# Patient Record
Sex: Male | Born: 1960
Health system: Southern US, Community
[De-identification: ages and names within clinical notes are randomized; demographics above are authoritative.]

## PROBLEM LIST (undated history)

## (undated) DIAGNOSIS — N189 Chronic kidney disease, unspecified: Secondary | ICD-10-CM

## (undated) DIAGNOSIS — T4145XA Adverse effect of unspecified anesthetic, initial encounter: Secondary | ICD-10-CM

## (undated) DIAGNOSIS — Z9889 Other specified postprocedural states: Secondary | ICD-10-CM

## (undated) DIAGNOSIS — R51 Headache: Secondary | ICD-10-CM

## (undated) DIAGNOSIS — G4700 Insomnia, unspecified: Secondary | ICD-10-CM

## (undated) DIAGNOSIS — M4846XA Fatigue fracture of vertebra, lumbar region, initial encounter for fracture: Secondary | ICD-10-CM

## (undated) DIAGNOSIS — R441 Visual hallucinations: Secondary | ICD-10-CM

## (undated) DIAGNOSIS — T8859XA Other complications of anesthesia, initial encounter: Secondary | ICD-10-CM

## (undated) DIAGNOSIS — I10 Essential (primary) hypertension: Secondary | ICD-10-CM

## (undated) DIAGNOSIS — R569 Unspecified convulsions: Secondary | ICD-10-CM

## (undated) DIAGNOSIS — R519 Headache, unspecified: Secondary | ICD-10-CM

## (undated) DIAGNOSIS — I729 Aneurysm of unspecified site: Secondary | ICD-10-CM

## (undated) DIAGNOSIS — R112 Nausea with vomiting, unspecified: Secondary | ICD-10-CM

## (undated) DIAGNOSIS — J302 Other seasonal allergic rhinitis: Secondary | ICD-10-CM

## (undated) HISTORY — PX: KNEE SURGERY: SHX244

---

## 2005-02-26 ENCOUNTER — Ambulatory Visit: Payer: Self-pay | Admitting: Unknown Physician Specialty

## 2006-03-23 DIAGNOSIS — I10 Essential (primary) hypertension: Secondary | ICD-10-CM | POA: Insufficient documentation

## 2008-10-22 ENCOUNTER — Emergency Department: Payer: Self-pay | Admitting: Emergency Medicine

## 2008-11-05 ENCOUNTER — Ambulatory Visit: Payer: Self-pay | Admitting: Specialist

## 2009-05-31 DIAGNOSIS — I729 Aneurysm of unspecified site: Secondary | ICD-10-CM

## 2009-05-31 HISTORY — DX: Aneurysm of unspecified site: I72.9

## 2009-05-31 HISTORY — PX: BRAIN SURGERY: SHX531

## 2010-02-07 ENCOUNTER — Emergency Department: Payer: Self-pay | Admitting: Emergency Medicine

## 2010-02-10 ENCOUNTER — Emergency Department: Payer: Self-pay | Admitting: Emergency Medicine

## 2010-03-12 DIAGNOSIS — I499 Cardiac arrhythmia, unspecified: Secondary | ICD-10-CM | POA: Insufficient documentation

## 2010-08-04 ENCOUNTER — Ambulatory Visit: Payer: Self-pay | Admitting: Family Medicine

## 2011-10-21 DIAGNOSIS — G40909 Epilepsy, unspecified, not intractable, without status epilepticus: Secondary | ICD-10-CM | POA: Insufficient documentation

## 2012-03-02 ENCOUNTER — Ambulatory Visit: Payer: Self-pay | Admitting: Orthopedic Surgery

## 2012-06-01 DIAGNOSIS — G4700 Insomnia, unspecified: Secondary | ICD-10-CM | POA: Insufficient documentation

## 2012-06-21 DIAGNOSIS — H538 Other visual disturbances: Secondary | ICD-10-CM | POA: Insufficient documentation

## 2012-06-21 DIAGNOSIS — I671 Cerebral aneurysm, nonruptured: Secondary | ICD-10-CM | POA: Insufficient documentation

## 2013-01-29 DIAGNOSIS — R413 Other amnesia: Secondary | ICD-10-CM | POA: Insufficient documentation

## 2013-06-07 DIAGNOSIS — R413 Other amnesia: Secondary | ICD-10-CM | POA: Insufficient documentation

## 2013-08-16 DIAGNOSIS — H5316 Psychophysical visual disturbances: Secondary | ICD-10-CM | POA: Insufficient documentation

## 2013-08-18 DIAGNOSIS — S37009A Unspecified injury of unspecified kidney, initial encounter: Secondary | ICD-10-CM | POA: Insufficient documentation

## 2014-11-22 ENCOUNTER — Telehealth: Payer: Self-pay | Admitting: Family Medicine

## 2014-11-22 DIAGNOSIS — R109 Unspecified abdominal pain: Secondary | ICD-10-CM | POA: Insufficient documentation

## 2014-11-22 DIAGNOSIS — R1084 Generalized abdominal pain: Secondary | ICD-10-CM

## 2014-11-22 NOTE — Telephone Encounter (Signed)
Pt is requesting referral to see a GI doctor for abdominal pain,diarrhea.He was seen for this in our office in Feb 2016 and states he is still having same issues.He has seen Dr Candace Cruise in the past

## 2014-11-22 NOTE — Telephone Encounter (Signed)
OK. Please refer to Dr. Candace Cruise.

## 2014-12-05 DIAGNOSIS — G8929 Other chronic pain: Secondary | ICD-10-CM | POA: Insufficient documentation

## 2014-12-05 DIAGNOSIS — R443 Hallucinations, unspecified: Secondary | ICD-10-CM | POA: Insufficient documentation

## 2014-12-05 DIAGNOSIS — K5790 Diverticulosis of intestine, part unspecified, without perforation or abscess without bleeding: Secondary | ICD-10-CM | POA: Insufficient documentation

## 2014-12-05 DIAGNOSIS — S32009A Unspecified fracture of unspecified lumbar vertebra, initial encounter for closed fracture: Secondary | ICD-10-CM | POA: Insufficient documentation

## 2014-12-05 DIAGNOSIS — K5730 Diverticulosis of large intestine without perforation or abscess without bleeding: Secondary | ICD-10-CM | POA: Insufficient documentation

## 2014-12-05 DIAGNOSIS — G4709 Other insomnia: Secondary | ICD-10-CM | POA: Insufficient documentation

## 2014-12-05 DIAGNOSIS — S32000A Wedge compression fracture of unspecified lumbar vertebra, initial encounter for closed fracture: Secondary | ICD-10-CM | POA: Insufficient documentation

## 2014-12-05 DIAGNOSIS — R51 Headache: Secondary | ICD-10-CM

## 2014-12-06 ENCOUNTER — Ambulatory Visit: Payer: Self-pay | Admitting: Family Medicine

## 2014-12-30 ENCOUNTER — Other Ambulatory Visit: Payer: Self-pay | Admitting: Nurse Practitioner

## 2014-12-30 DIAGNOSIS — K5730 Diverticulosis of large intestine without perforation or abscess without bleeding: Secondary | ICD-10-CM

## 2014-12-30 DIAGNOSIS — R1032 Left lower quadrant pain: Secondary | ICD-10-CM

## 2015-01-01 ENCOUNTER — Ambulatory Visit
Admission: RE | Admit: 2015-01-01 | Discharge: 2015-01-01 | Disposition: A | Discharge status: Home / Self Care | Payer: 59 | Source: Ambulatory Visit | Attending: Nurse Practitioner | Admitting: Nurse Practitioner

## 2015-01-01 DIAGNOSIS — N281 Cyst of kidney, acquired: Secondary | ICD-10-CM | POA: Diagnosis not present

## 2015-01-01 DIAGNOSIS — K5730 Diverticulosis of large intestine without perforation or abscess without bleeding: Secondary | ICD-10-CM | POA: Insufficient documentation

## 2015-01-01 DIAGNOSIS — N200 Calculus of kidney: Secondary | ICD-10-CM | POA: Insufficient documentation

## 2015-01-01 DIAGNOSIS — R1032 Left lower quadrant pain: Secondary | ICD-10-CM

## 2015-01-01 HISTORY — DX: Essential (primary) hypertension: I10

## 2015-01-01 MED ORDER — IOHEXOL 300 MG/ML  SOLN
100.0000 mL | Freq: Once | INTRAMUSCULAR | Status: AC | PRN
  Administered 2015-01-01: 100 mL via INTRAVENOUS

## 2015-01-06 ENCOUNTER — Ambulatory Visit: Admission: RE | Admit: 2015-01-06 | Payer: Self-pay | Source: Ambulatory Visit

## 2015-01-15 ENCOUNTER — Encounter: Payer: Self-pay | Admitting: *Deleted

## 2015-01-16 ENCOUNTER — Encounter: Payer: Self-pay | Admitting: Anesthesiology

## 2015-01-16 ENCOUNTER — Ambulatory Visit: Payer: 59 | Admitting: Anesthesiology

## 2015-01-16 ENCOUNTER — Encounter
Admission: RE | Disposition: A | Discharge status: Home Health | Payer: Self-pay | Source: Ambulatory Visit | Attending: Gastroenterology

## 2015-01-16 ENCOUNTER — Ambulatory Visit
Admission: RE | Admit: 2015-01-16 | Discharge: 2015-01-16 | Disposition: A | Discharge status: Home Health | Payer: 59 | Source: Ambulatory Visit | Attending: Gastroenterology | Admitting: Gastroenterology

## 2015-01-16 DIAGNOSIS — D120 Benign neoplasm of cecum: Secondary | ICD-10-CM | POA: Diagnosis not present

## 2015-01-16 DIAGNOSIS — Z860101 Personal history of adenomatous and serrated colon polyps: Secondary | ICD-10-CM

## 2015-01-16 DIAGNOSIS — R1032 Left lower quadrant pain: Secondary | ICD-10-CM | POA: Diagnosis present

## 2015-01-16 DIAGNOSIS — Z79899 Other long term (current) drug therapy: Secondary | ICD-10-CM | POA: Insufficient documentation

## 2015-01-16 DIAGNOSIS — I10 Essential (primary) hypertension: Secondary | ICD-10-CM | POA: Diagnosis not present

## 2015-01-16 DIAGNOSIS — Z888 Allergy status to other drugs, medicaments and biological substances status: Secondary | ICD-10-CM | POA: Insufficient documentation

## 2015-01-16 DIAGNOSIS — Z86010 Personal history of colonic polyps: Secondary | ICD-10-CM

## 2015-01-16 HISTORY — DX: Visual hallucinations: R44.1

## 2015-01-16 HISTORY — DX: Aneurysm of unspecified site: I72.9

## 2015-01-16 HISTORY — PX: COLONOSCOPY WITH PROPOFOL: SHX5780

## 2015-01-16 HISTORY — DX: Insomnia, unspecified: G47.00

## 2015-01-16 HISTORY — DX: Unspecified convulsions: R56.9

## 2015-01-16 HISTORY — DX: Other seasonal allergic rhinitis: J30.2

## 2015-01-16 SURGERY — COLONOSCOPY WITH PROPOFOL
Anesthesia: General

## 2015-01-16 MED ORDER — MIDAZOLAM HCL 2 MG/2ML IJ SOLN
INTRAMUSCULAR | Status: DC | PRN
  Administered 2015-01-16: 1 mg via INTRAVENOUS

## 2015-01-16 MED ORDER — SODIUM CHLORIDE 0.9 % IV SOLN: INTRAVENOUS | Status: DC

## 2015-01-16 MED ORDER — PROPOFOL INFUSION 10 MG/ML OPTIME
INTRAVENOUS | Status: DC | PRN
  Administered 2015-01-16: 120 ug/kg/min via INTRAVENOUS

## 2015-01-16 MED ORDER — FENTANYL CITRATE (PF) 100 MCG/2ML IJ SOLN
INTRAMUSCULAR | Status: DC | PRN
  Administered 2015-01-16: 50 ug via INTRAVENOUS

## 2015-01-16 MED ORDER — SODIUM CHLORIDE 0.9 % IV SOLN
INTRAVENOUS | Status: DC
  Administered 2015-01-16: 1000 mL via INTRAVENOUS

## 2015-01-16 NOTE — Op Note (Signed)
[facility] Gastroenterology Patient Name: Dustin Paul Procedure Date: 01/16/2015 1:43 PM MRN: [id number] Account #: [id number] Date of Birth: 1961/03/25 Admit Type: Outpatient Age: 54 Room: [facility] ENDO ROOM 4 Gender: Male Note Status: Finalized Procedure:         Colonoscopy Indications:       Abdominal pain in the left lower quadrant, Change in bowel                     habits Providers:         Lupita Dawn. Candace Cruise, MD Referring MD:      Kirstie Peri. Caryn Section, MD (Referring MD) Medicines:         Monitored Anesthesia Care Complications:     No immediate complications. Procedure:         Pre-Anesthesia Assessment:                    - Prior to the procedure, a History and Physical was                     performed, and patient medications, allergies and                     sensitivities were reviewed. The patient's tolerance of                     previous anesthesia was reviewed.                    - The risks and benefits of the procedure and the sedation                     options and risks were discussed with the patient. All                     questions were answered and informed consent was obtained.                    - After reviewing the risks and benefits, the patient was                     deemed in satisfactory condition to undergo the procedure.                    After obtaining informed consent, the colonoscope was                     passed under direct vision. Throughout the procedure, the                     patient's blood pressure, pulse, and oxygen saturations                     were monitored continuously. The Colonoscope was                     introduced through the anus and advanced to the the cecum,                     identified by appendiceal orifice and ileocecal valve. The                     colonoscopy was performed without difficulty. The patient  tolerated the procedure well. The quality of the bowel      preparation was good. Findings:      A small polyp was found in the cecum. The polyp was sessile. The polyp       was removed with a cold snare. Resection and retrieval were complete.      The exam was otherwise without abnormality.      No diverticuli in sigmoid area. Impression:        - One small polyp in the cecum. Resected and retrieved.                    - The examination was otherwise normal. Recommendation:    - Discharge patient to home.                    - High fiber diet daily.                    - The findings and recommendations were discussed with the                     patient.                    - Await pathology results.                    - Repeat colonoscopy in 5 years for surveillance. Procedure Code(s): --- Professional ---                    [phone], Colonoscopy, flexible; with removal of tumor(s),                     polyp(s), or other lesion(s) by snare technique Diagnosis Code(s): --- Professional ---                    D12.0, Benign neoplasm of cecum                    R10.32, Left lower quadrant pain                    R19.4, Change in bowel habit CPT copyright 2014 American Medical Association. All rights reserved. The codes documented in this report are preliminary and upon coder review may  be revised to meet current compliance requirements. Hulen Luster, MD 01/16/2015 2:02:55 PM This report has been signed electronically. Number of Addenda: 0 Note Initiated On: 01/16/2015 1:43 PM Scope Withdrawal Time: 0 hours 8 minutes 38 seconds  Total Procedure Duration: 0 hours 13 minutes 20 seconds       [facility]

## 2015-01-16 NOTE — Anesthesia Procedure Notes (Signed)
Performed by: COOK-MARTIN, Amarea Macdowell Pre-anesthesia Checklist: Patient identified, Emergency Drugs available, Suction available, Patient being monitored and Timeout performed Patient Re-evaluated:Patient Re-evaluated prior to inductionOxygen Delivery Method: Nasal cannula Preoxygenation: Pre-oxygenation with 100% oxygen Intubation Type: IV induction Placement Confirmation: positive ETCO2 and CO2 detector       

## 2015-01-16 NOTE — Transfer of Care (Signed)
Immediate Anesthesia Transfer of Care Note  Patient: Dustin Paul  Procedure(s) Performed: Procedure(s): COLONOSCOPY WITH PROPOFOL (N/A)  Patient Location: PACU  Anesthesia Type:General  Level of Consciousness: awake, alert , oriented and sedated  Airway & Oxygen Therapy: Patient Spontanous Breathing and Patient connected to nasal cannula oxygen  Post-op Assessment: Report given to RN and Post -op Vital signs reviewed and stable  Post vital signs: Reviewed and stable  Last Vitals: There were no vitals filed for this visit.  Complications: No apparent anesthesia complications

## 2015-01-16 NOTE — H&P (Signed)
  Date of Initial H&P: 12/30/2014  History reviewed, patient examined, no change in status, stable for surgery.

## 2015-01-16 NOTE — Anesthesia Preprocedure Evaluation (Signed)
Anesthesia Evaluation  Patient identified by MRN, date of birth, ID band Patient awake    Reviewed: Allergy & Precautions, H&P , NPO status , Patient's Chart, lab work & pertinent test results, reviewed documented beta blocker date and time   History of Anesthesia Complications (+) PROLONGED EMERGENCE, Emergence Delirium and history of anesthetic complications  Airway Mallampati: II  TM Distance: >3 FB Neck ROM: full    Dental no notable dental hx.    Pulmonary neg pulmonary ROS,  breath sounds clear to auscultation  Pulmonary exam normal       Cardiovascular Exercise Tolerance: Good hypertension, negative cardio ROS  + dysrhythmias Rhythm:regular Rate:Normal     Neuro/Psych negative neurological ROS  negative psych ROS   GI/Hepatic negative GI ROS, Neg liver ROS,   Endo/Other  negative endocrine ROS  Renal/GU Renal diseasenegative Renal ROS  negative genitourinary   Musculoskeletal   Abdominal   Peds  Hematology negative hematology ROS (+)   Anesthesia Other Findings   Reproductive/Obstetrics negative OB ROS                             Anesthesia Physical Anesthesia Plan  ASA: III  Anesthesia Plan: General   Post-op Pain Management:    Induction:   Airway Management Planned:   Additional Equipment:   Intra-op Plan:   Post-operative Plan:   Informed Consent: I have reviewed the patients History and Physical, chart, labs and discussed the procedure including the risks, benefits and alternatives for the proposed anesthesia with the patient or authorized representative who has indicated his/her understanding and acceptance.   Dental Advisory Given  Plan Discussed with: CRNA  Anesthesia Plan Comments:         Anesthesia Quick Evaluation

## 2015-01-17 ENCOUNTER — Encounter: Payer: Self-pay | Admitting: Gastroenterology

## 2015-01-20 LAB — SURGICAL PATHOLOGY

## 2015-01-20 NOTE — Anesthesia Postprocedure Evaluation (Signed)
  Anesthesia Post-op Note  Patient: Dustin Paul  Procedure(s) Performed: Procedure(s): COLONOSCOPY WITH PROPOFOL (N/A)  Anesthesia type:General  Patient location: PACU  Post pain: Pain level controlled  Post assessment: Post-op Vital signs reviewed, Patient's Cardiovascular Status Stable, Respiratory Function Stable, Patent Airway and No signs of Nausea or vomiting  Post vital signs: Reviewed and stable  Last Vitals:  Filed Vitals:   01/16/15 1440  BP: 112/89  Pulse: 72  Temp:   Resp: 23    Level of consciousness: awake, alert  and patient cooperative  Complications: No apparent anesthesia complications

## 2015-01-21 DIAGNOSIS — Z86010 Personal history of colonic polyps: Secondary | ICD-10-CM

## 2015-01-21 DIAGNOSIS — Z860101 Personal history of adenomatous and serrated colon polyps: Secondary | ICD-10-CM

## 2015-05-13 ENCOUNTER — Other Ambulatory Visit: Payer: Self-pay | Admitting: Family Medicine

## 2015-05-14 ENCOUNTER — Ambulatory Visit: Payer: Self-pay | Admitting: Licensed Clinical Social Worker

## 2015-05-15 ENCOUNTER — Encounter: Payer: Self-pay | Admitting: Psychiatry

## 2015-05-15 ENCOUNTER — Ambulatory Visit (INDEPENDENT_AMBULATORY_CARE_PROVIDER_SITE_OTHER): Payer: 59 | Admitting: Psychiatry

## 2015-05-15 ENCOUNTER — Ambulatory Visit: Payer: Self-pay | Admitting: Licensed Clinical Social Worker

## 2015-05-15 VITALS — BP 104/78 | HR 72 | Temp 97.9°F | Resp 16 | Ht 71.5 in | Wt 208.0 lb

## 2015-05-15 DIAGNOSIS — F29 Unspecified psychosis not due to a substance or known physiological condition: Secondary | ICD-10-CM

## 2015-05-15 DIAGNOSIS — F5105 Insomnia due to other mental disorder: Secondary | ICD-10-CM

## 2015-05-15 DIAGNOSIS — F489 Nonpsychotic mental disorder, unspecified: Secondary | ICD-10-CM | POA: Diagnosis not present

## 2015-05-15 MED ORDER — ARIPIPRAZOLE 5 MG PO TABS: 5.0000 mg | ORAL_TABLET | Freq: Every day | ORAL | Status: DC

## 2015-05-15 MED ORDER — TRAZODONE HCL 50 MG PO TABS: 100.0000 mg | ORAL_TABLET | Freq: Every day | ORAL | Status: DC

## 2015-05-15 MED ORDER — MIRTAZAPINE 30 MG PO TABS: 30.0000 mg | ORAL_TABLET | Freq: Every day | ORAL | Status: DC
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
## 2015-05-15 NOTE — Patient Instructions (Signed)
HAVE FASTING LABS DONE IN NEXT 1 To 2 weeks.

## 2015-05-21 ENCOUNTER — Ambulatory Visit: Payer: Self-pay | Admitting: Psychiatry

## 2015-06-15 ENCOUNTER — Other Ambulatory Visit: Payer: Self-pay | Admitting: Family Medicine

## 2015-06-16 ENCOUNTER — Encounter: Payer: Self-pay | Admitting: Psychiatry

## 2015-06-16 ENCOUNTER — Ambulatory Visit (INDEPENDENT_AMBULATORY_CARE_PROVIDER_SITE_OTHER): Payer: BLUE CROSS/BLUE SHIELD | Admitting: Psychiatry

## 2015-06-16 VITALS — BP 122/78 | HR 84 | Temp 97.8°F | Ht 71.5 in | Wt 208.6 lb

## 2015-06-16 DIAGNOSIS — F489 Nonpsychotic mental disorder, unspecified: Secondary | ICD-10-CM | POA: Diagnosis not present

## 2015-06-16 DIAGNOSIS — F29 Unspecified psychosis not due to a substance or known physiological condition: Secondary | ICD-10-CM | POA: Diagnosis not present

## 2015-06-16 DIAGNOSIS — F5105 Insomnia due to other mental disorder: Secondary | ICD-10-CM | POA: Diagnosis not present

## 2015-06-16 MED ORDER — TRAZODONE HCL 50 MG PO TABS: ORAL_TABLET | ORAL | Status: DC

## 2015-06-16 MED ORDER — MIRTAZAPINE 30 MG PO TABS: 30.0000 mg | ORAL_TABLET | Freq: Every day | ORAL | Status: DC

## 2015-06-16 MED ORDER — ARIPIPRAZOLE 5 MG PO TABS: 5.0000 mg | ORAL_TABLET | Freq: Every day | ORAL | Status: DC

## 2015-06-16 NOTE — Progress Notes (Signed)
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 He was seen for the first time last month after transferring his care to this clinic secondary to his previous psychiatrist closing his practice. He indicates he's largely been stable on his current regimen of Abilify, mirtazapine and trazodone since March or April 2015. He denies any changes. He states overall his symptoms have been under reasonable control on these medications.  State that occasionally he'll have issues with his sight in one eye going away. He does worry about things such as his daughters and his seizures and intermittent problems with vision. He states his hallucinations have generally been under good control and that these started after he had surgery for his aneurysms. Chief Complaint: Okay Chief Complaint    Follow-up; Medication Refill     Visit Diagnosis:     ICD-9-CM ICD-10-CM   1. Insomnia due to mental condition 300.9 F48.9    327.02 F51.05   2. Psychosis, unspecified psychosis type 298.9 F29     Past Medical History:  Past Medical History  Diagnosis Date  . Hypertension   . Aneurysm of artery ([place])     middle cerebral  . Seizures ([place])   . Seasonal allergies   . Insomnia   . Visual hallucinations     Past Surgical History  Procedure Laterality Date  . Back surgery    . Brain surgery    . Knee surgery Left   . Colonoscopy with propofol N/A 01/16/2015    Procedure: COLONOSCOPY WITH PROPOFOL;  Surgeon: Hulen Luster, MD;  Location: [facility] ENDOSCOPY;  Service: Gastroenterology;  Laterality: N/A;   Family History: History reviewed. No pertinent family history. Social History:  Social History   Social History  . Marital Status: Widowed    Spouse Name: N/A  . Number of Children: N/A  . Years of Education: N/A   Social History Main Topics  . Smoking status: Never Smoker   .  Smokeless tobacco: Never Used  . Alcohol Use: No  . Drug Use: No  . Sexual Activity: Not Currently   Other Topics Concern  . None   Social History Narrative   Additional History:   Assessment:   Musculoskeletal: Strength & Muscle Tone: within normal limits Gait & Station: normal Patient leans: N/A  Psychiatric Specialty Exam: HPI  ROS  Blood pressure 122/78, pulse 84, temperature 97.8 F (36.6 C), temperature source Tympanic, height 5' 11.5" (1.816 m), weight 208 lb 9.6 oz (94.62 kg), SpO2 94 %.Body mass index is 28.69 kg/(m^2).  General Appearance: Well Groomed  Eye Contact:  Good  Speech:  Normal Rate  Volume:  Normal  Mood:  Okay  Affect:  Constricted  Thought Process:  Linear and Logical  Orientation:  Full (Time, Place, and Person)  Thought Content:  Negative  Suicidal Thoughts:  No  Homicidal Thoughts:  No  Memory:  Immediate;   Good Recent;   Good Remote;   Good  Judgement:  Good  Insight:  Good  Psychomotor Activity:  Negative  Concentration:  Good  Recall:  Good  Fund of Knowledge: Good  Language: Good  Akathisia:  Negative  Handed:    AIMS (if indicated):  Done 05/15/2015 and normal   Assets:  Communication Skills Desire for Improvement Social Support  ADL's:  Intact  Cognition: Impaired,  Moderate  Sleep:  Poor, he relates over past  week he may wake up between the hours of 3 and 7    Is the patient at risk to self?  No. Has the patient been a risk to self in the past 6 months?  No. Has the patient been a risk to self within the distant past?  No. Is the patient a risk to others?  No. Has the patient been a risk to others in the past 6 months?  No. Has the patient been a risk to others within the distant past?  No.  Current Medications: Current Outpatient Prescriptions  Medication Sig Dispense Refill  . Alpha-Lipoic Acid 300 MG CAPS Take 300 mg by mouth every morning. Reported on 05/15/2015    . ARIPiprazole (ABILIFY) 5 MG tablet Take 1  tablet (5 mg total) by mouth daily. 30 tablet 4  . Armodafinil (NUVIGIL) 150 MG tablet Take 150 mg by mouth daily.    Marland Kitchen BACILLUS COAGULANS-INULIN PO Take by mouth every morning. Reported on 05/15/2015    . celecoxib (CELEBREX) 200 MG capsule Take 1 capsule by mouth daily.    . Chlorophyll-Alfalfa 20-100 MG TABS Take 340 mg by mouth Nightly. Reported on 05/15/2015    . cyclobenzaprine (FLEXERIL) 10 MG tablet Take 10 mg by mouth. Reported on 05/15/2015    . diclofenac (CATAFLAM) 50 MG tablet Take 50 mg by mouth 2 (two) times daily. Reported on 05/15/2015    . fexofenadine (ALLEGRA) 180 MG tablet Take 1 tablet by mouth daily.    Marland Kitchen gabapentin (NEURONTIN) 300 MG capsule Take 300 mg by mouth at bedtime. Reported on 05/15/2015    . hyoscyamine (LEVSIN, ANASPAZ) 0.125 MG tablet Take 1 tablet by mouth every 6 (six) hours as needed.    . lamoTRIgine (LAMICTAL) 150 MG tablet Take 1 tablet by mouth 2 (two) times daily.    Marland Kitchen lisinopril (PRINIVIL,ZESTRIL) 5 MG tablet TAKE 1 TABLET BY MOUTH EVERY DAY 30 tablet 5  . Melatonin 10 MG CAPS Take 1 capsule by mouth at bedtime.    . mirtazapine (REMERON) 30 MG tablet Take 1 tablet (30 mg total) by mouth at bedtime. 30 tablet 4  . SUMAtriptan (IMITREX) 100 MG tablet Take 100 mg by mouth every 2 (two) hours as needed for migraine. May repeat in 2 hours if headache persists or recurs.    . Suvorexant 10 MG TABS Take 10 mg by mouth Nightly. Reported on 05/15/2015    . Topiramate ER (TROKENDI XR) 100 MG CP24 Take 1 capsule by mouth daily.    . Topiramate ER (TROKENDI XR) 50 MG CP24 Take 1 capsule by mouth daily.    Marland Kitchen zonisamide (ZONEGRAN) 50 MG capsule Take 1 capsule by mouth as needed. For headache    . traZODone (DESYREL) 50 MG tablet Take two to three tablets at bedtime for sleep. 90 tablet 4   No current facility-administered medications for this visit.    Medical Decision Making:  Established Problem, Stable/Improving (1), Review of Medication Regimen & Side  Effects (2) and Review of New Medication or Change in Dosage (2)  Treatment Plan Summary:Medication management and Plan plan  Psychotic disorder due to another medical condition-as discussed above the patient developed the hallucinations after having surgery for brain aneurysms. These have now been stable on Abilify 5 mg. he indicates he had misplaced his laboratory slip to get metabolic labs but then he recently found. He indicates he will [facility] [place] and get these done.  Insomnia secondary to  medical condition-the patient has found a good response to trazodone and mirtazapine. We'll continue  mirtazapine 30 mg at bedtime. Reports over the past week sleeping has been difficult thus I have instructed him that he can take trazodone 100 or 150 mg at bedtime.  I made him aware of my departure from the practice in February. I've encouraged him to call any questions or concerns prior to his next appointment. He'll follow-up with her next writer in 3 months however he can call any questions or concerns prior to that appointment. As indicates that his disability company periodically wants updates from his providers. I'm going to send in an update letter for patient given that it may be some time before he sees the new provider in this clinic. He presents today with examples of his previous letters from his previous psychiatrist. Faith Rogue 06/16/2015, 2:56 PM

## 2015-06-16 NOTE — Patient Instructions (Signed)
Have labs done?

## 2015-06-25 ENCOUNTER — Other Ambulatory Visit: Payer: Self-pay | Admitting: Psychiatry

## 2015-06-25 LAB — BASIC METABOLIC PANEL: GLUCOSE: 94 mg/dL

## 2015-06-25 LAB — TSH: TSH: 2.43 u[IU]/mL (ref 0.41–5.90)

## 2015-06-25 LAB — LIPID PANEL
Cholesterol: 135 mg/dL (ref 0–200)
HDL: 33 mg/dL — AB (ref 35–70)
LDL Cholesterol: 75 mg/dL
TRIGLYCERIDES: 135 mg/dL (ref 40–160)

## 2015-06-26 LAB — TSH: TSH: 2.43 u[IU]/mL (ref 0.450–4.500)

## 2015-06-26 LAB — PROLACTIN: PROLACTIN: 18.9 ng/mL — AB (ref 4.0–15.2)

## 2015-06-26 LAB — LIPID PANEL W/O CHOL/HDL RATIO
Cholesterol, Total: 135 mg/dL (ref 100–199)
HDL: 33 mg/dL — ABNORMAL LOW (ref >39)
LDL CALC: 75 mg/dL (ref 0–99)
Triglycerides: 135 mg/dL (ref 0–149)
VLDL CHOLESTEROL CAL: 27 mg/dL (ref 5–40)

## 2015-06-26 LAB — GLUCOSE, RANDOM: GLUCOSE: 94 mg/dL (ref 65–99)

## 2015-06-30 ENCOUNTER — Telehealth: Payer: Self-pay | Admitting: Psychiatry

## 2015-06-30 NOTE — Telephone Encounter (Signed)
Writer called patient on 11/25/2015 to discuss his lab results. His metabolic labs were normal with the exception of elevated prolactin at 18.9. In addition his HDL was slightly low at 33. I discussed with patient that if this point we would probably recheck this. However I'm going to make a note his chart so that the next provider can follow-up with him in regards to checking this result again. AW

## 2015-07-03 ENCOUNTER — Encounter: Payer: Self-pay | Admitting: Physician Assistant

## 2015-07-03 ENCOUNTER — Ambulatory Visit (INDEPENDENT_AMBULATORY_CARE_PROVIDER_SITE_OTHER): Payer: BLUE CROSS/BLUE SHIELD | Admitting: Physician Assistant

## 2015-07-03 VITALS — BP 128/70 | HR 94 | Temp 100.6°F | Resp 16 | Wt 215.0 lb

## 2015-07-03 DIAGNOSIS — R6889 Other general symptoms and signs: Secondary | ICD-10-CM | POA: Diagnosis not present

## 2015-07-03 DIAGNOSIS — J0110 Acute frontal sinusitis, unspecified: Secondary | ICD-10-CM

## 2015-07-03 LAB — POCT INFLUENZA A/B
INFLUENZA B, POC: NEGATIVE
Influenza A, POC: NEGATIVE

## 2015-07-03 MED ORDER — AMOXICILLIN-POT CLAVULANATE 875-125 MG PO TABS: 1.0000 | ORAL_TABLET | Freq: Two times a day (BID) | ORAL | Status: DC

## 2015-07-03 NOTE — Progress Notes (Signed)
Patient: Dustin Paul Male    DOB: 07/07/1960   55 y.o.   MRN: [id number] Visit Date: 07/03/2015  Today's Provider: Mar Daring, PA-C   Chief Complaint  Patient presents with  . Cough   Subjective:    Cough This is a new problem. The current episode started yesterday. The problem has been gradually worsening. The problem occurs constantly. Associated symptoms include chills, ear congestion, a fever (last night 100.5 and after lunch today it was 101), headaches, nasal congestion, postnasal drip, rhinorrhea, a sore throat, shortness of breath and wheezing. Pertinent negatives include no chest pain, ear pain or rash. The symptoms are aggravated by lying down. He has tried OTC cough suppressant (Nyquil last night and Excedrin at lunch today.) for the symptoms. The treatment provided no relief.      Allergies  Allergen Reactions  . Aspirin   . Levetiracetam     Racing thoughts per [facility] neurology note 03-11-11   Previous Medications   ALPHA-LIPOIC ACID 300 MG CAPS    Take 300 mg by mouth every morning. Reported on 05/15/2015   ARIPIPRAZOLE (ABILIFY) 5 MG TABLET    Take 1 tablet (5 mg total) by mouth daily.   ARMODAFINIL (NUVIGIL) 150 MG TABLET    Take 150 mg by mouth daily.   BACILLUS COAGULANS-INULIN PO    Take by mouth every morning. Reported on 05/15/2015   CELECOXIB (CELEBREX) 200 MG CAPSULE    Take 1 capsule by mouth daily.   CHLOROPHYLL-ALFALFA 20-100 MG TABS    Take 340 mg by mouth Nightly. Reported on 05/15/2015   CYCLOBENZAPRINE (FLEXERIL) 10 MG TABLET    Take 10 mg by mouth. Reported on 05/15/2015   DICLOFENAC (CATAFLAM) 50 MG TABLET    Take 50 mg by mouth 2 (two) times daily. Reported on 05/15/2015   GABAPENTIN (NEURONTIN) 300 MG CAPSULE    Take 300 mg by mouth at bedtime. Reported on 07/03/2015   HYOSCYAMINE (LEVSIN, ANASPAZ) 0.125 MG TABLET    Take 1 tablet by mouth every 6 (six) hours as needed. Reported on 07/03/2015   LAMOTRIGINE (LAMICTAL) 150 MG TABLET     Take 1 tablet by mouth 2 (two) times daily.   LISINOPRIL (PRINIVIL,ZESTRIL) 5 MG TABLET    TAKE 1 TABLET BY MOUTH EVERY DAY   MELATONIN 10 MG CAPS    Take 1 capsule by mouth at bedtime.   MIRTAZAPINE (REMERON) 30 MG TABLET    Take 1 tablet (30 mg total) by mouth at bedtime.   QUDEXY XR 150 MG CS24    TK 1 C PO QHS   SUMATRIPTAN (IMITREX) 100 MG TABLET    Take 100 mg by mouth every 2 (two) hours as needed for migraine. May repeat in 2 hours if headache persists or recurs.   SUVOREXANT 10 MG TABS    Take 10 mg by mouth Nightly. Reported on 07/03/2015   TRAZODONE (DESYREL) 50 MG TABLET    Take two to three tablets at bedtime for sleep.    Review of Systems  Constitutional: Positive for fever (last night 100.5 and after lunch today it was 101), chills and appetite change (decreased).  HENT: Positive for congestion, postnasal drip, rhinorrhea, sinus pressure, sneezing, sore throat and trouble swallowing (Just in the mornings). Negative for ear pain.   Respiratory: Positive for cough (when coughing chest feels like it burns), shortness of breath and wheezing. Negative for chest tightness.   Cardiovascular: Negative for chest pain  and palpitations.  Gastrointestinal: Negative for nausea, vomiting and abdominal pain.  Skin: Negative for rash.  Neurological: Positive for dizziness and headaches.    Social History  Substance Use Topics  . Smoking status: Never Smoker   . Smokeless tobacco: Never Used  . Alcohol Use: No   Objective:   BP 128/70 mmHg  Pulse 94  Temp(Src) 100.6 F (38.1 C) (Oral)  Resp 16  Wt 215 lb (97.523 kg)  SpO2 96%  Physical Exam  Constitutional: He appears well-developed and well-nourished. No distress.  HENT:  Head: Normocephalic and atraumatic.  Right Ear: Hearing and external ear normal. Tympanic membrane is not erythematous, not retracted and not bulging. A middle ear effusion is present.  Left Ear: Hearing and external ear normal. Tympanic membrane is not  erythematous, not retracted and not bulging. A middle ear effusion is present.  Nose: Mucosal edema and rhinorrhea present. Right sinus exhibits maxillary sinus tenderness. Right sinus exhibits no frontal sinus tenderness. Left sinus exhibits maxillary sinus tenderness. Left sinus exhibits no frontal sinus tenderness.  Mouth/Throat: Uvula is midline, oropharynx is clear and moist and mucous membranes are normal. No oropharyngeal exudate, posterior oropharyngeal edema or posterior oropharyngeal erythema.  Eyes: Conjunctivae and EOM are normal. Pupils are equal, round, and reactive to light. Right eye exhibits no discharge. Left eye exhibits no discharge.  Neck: Normal range of motion. Neck supple. No JVD present. No tracheal deviation present. No Brudzinski's sign and no Kernig's sign noted. No thyromegaly present.  Cardiovascular: Normal rate, regular rhythm and normal heart sounds.  Exam reveals no gallop and no friction rub.   No murmur heard. Pulmonary/Chest: Effort normal and breath sounds normal. No stridor. No respiratory distress. He has no wheezes. He has no rales. He exhibits no tenderness.  Lymphadenopathy:    He has no cervical adenopathy.  Skin: Skin is warm and dry.        Assessment & Plan:     1. Flu-like symptoms Flu test was negative. - POCT Influenza A/B  2. Acute frontal sinusitis, recurrence not specified Worsening symptoms with fever.  Will give augmentin as below. He may take mucinex dm or delsym for congestion and cough.  He may take tylenol and Ibuprofen for fevers and body aches. He needs to stay well hydrated and get plenty of rest. He is to call the office if symptoms fail to improve or worsen. - amoxicillin-clavulanate (AUGMENTIN) 875-125 MG tablet; Take 1 tablet by mouth 2 (two) times daily.  Dispense: 20 tablet; Refill: 0       Mar Daring, PA-C  [facility]

## 2015-07-03 NOTE — Patient Instructions (Signed)

## 2015-08-22 ENCOUNTER — Other Ambulatory Visit: Payer: Self-pay | Admitting: Orthopedic Surgery

## 2015-08-22 DIAGNOSIS — M25562 Pain in left knee: Secondary | ICD-10-CM

## 2015-08-22 DIAGNOSIS — M25462 Effusion, left knee: Secondary | ICD-10-CM

## 2015-08-22 DIAGNOSIS — M1712 Unilateral primary osteoarthritis, left knee: Secondary | ICD-10-CM

## 2015-08-22 DIAGNOSIS — M2352 Chronic instability of knee, left knee: Secondary | ICD-10-CM

## 2015-09-08 ENCOUNTER — Ambulatory Visit: Payer: No Typology Code available for payment source | Admitting: Psychiatry

## 2015-09-15 ENCOUNTER — Ambulatory Visit
Admission: RE | Admit: 2015-09-15 | Discharge: 2015-09-15 | Disposition: A | Discharge status: Home / Self Care | Payer: BLUE CROSS/BLUE SHIELD | Source: Ambulatory Visit | Attending: Orthopedic Surgery | Admitting: Orthopedic Surgery

## 2015-09-15 DIAGNOSIS — M238X2 Other internal derangements of left knee: Secondary | ICD-10-CM | POA: Diagnosis not present

## 2015-09-15 DIAGNOSIS — M25462 Effusion, left knee: Secondary | ICD-10-CM

## 2015-09-15 DIAGNOSIS — M25562 Pain in left knee: Secondary | ICD-10-CM

## 2015-09-15 DIAGNOSIS — M659 Synovitis and tenosynovitis, unspecified: Secondary | ICD-10-CM | POA: Insufficient documentation

## 2015-09-15 DIAGNOSIS — M2352 Chronic instability of knee, left knee: Secondary | ICD-10-CM

## 2015-09-15 DIAGNOSIS — M1712 Unilateral primary osteoarthritis, left knee: Secondary | ICD-10-CM

## 2015-09-15 DIAGNOSIS — M2342 Loose body in knee, left knee: Secondary | ICD-10-CM | POA: Diagnosis not present

## 2015-09-24 ENCOUNTER — Ambulatory Visit: Payer: No Typology Code available for payment source | Admitting: Psychiatry

## 2015-09-24 ENCOUNTER — Ambulatory Visit (INDEPENDENT_AMBULATORY_CARE_PROVIDER_SITE_OTHER): Payer: BLUE CROSS/BLUE SHIELD | Admitting: Psychiatry

## 2015-09-24 ENCOUNTER — Encounter: Payer: Self-pay | Admitting: Psychiatry

## 2015-09-24 VITALS — BP 122/78 | HR 104 | Temp 98.7°F | Ht 71.5 in | Wt 214.4 lb

## 2015-09-24 DIAGNOSIS — F5105 Insomnia due to other mental disorder: Secondary | ICD-10-CM | POA: Diagnosis not present

## 2015-09-24 DIAGNOSIS — F489 Nonpsychotic mental disorder, unspecified: Secondary | ICD-10-CM

## 2015-09-24 DIAGNOSIS — F29 Unspecified psychosis not due to a substance or known physiological condition: Secondary | ICD-10-CM | POA: Diagnosis not present

## 2015-09-24 MED ORDER — TRAZODONE HCL 150 MG PO TABS: ORAL_TABLET | ORAL | Status: DC

## 2015-09-24 MED ORDER — LAMOTRIGINE 150 MG PO TABS: 150.0000 mg | ORAL_TABLET | Freq: Two times a day (BID) | ORAL | Status: DC

## 2015-09-24 MED ORDER — MIRTAZAPINE 30 MG PO TABS: 30.0000 mg | ORAL_TABLET | Freq: Every day | ORAL | Status: DC

## 2015-09-24 MED ORDER — ARIPIPRAZOLE 5 MG PO TABS: 5.0000 mg | ORAL_TABLET | Freq: Every day | ORAL | Status: DC

## 2015-09-24 NOTE — Progress Notes (Signed)
[facility] MD/PA/NP OP Progress Note  09/24/2015 1:18 PM Dustin Paul  MRN:  [id number]  Subjective:  Patient is a 55 year old male with history of psychotic disorder and insomnia who presented for follow-up. He was previously following Dr. Jimmye Norman. He reported that he is compliant with his medications. He reported that he has history of hallucinations last year and since he has been started on Abilify he is doing well. He reported that he is not experiencing any hallucinations at this time. He is currently applying for disability and has to go in front of the federal judge to get the federal disability. He reported that he spends time reading books. Patient reported that he has been sleeping well. He is not getting Nuvigil as it has been declined by his insurance company. He reported that he also takes mirtazapine and melatonin at night to help him sleep. He appears calm and stable at this time. He also takes lamotrigine due to his seizure disorder. He has history of aneurysm. He currently denied having any problems with his medications. He reported that he has not noticed worsening of his symptoms.   He has also seen Dr. Bridgett Larsson in the past.    Chief Complaint: Chief Complaint    Follow-up; Medication Refill     Visit Diagnosis:     ICD-9-CM ICD-10-CM   1. Insomnia due to mental condition 300.9 F48.9    327.02 F51.05   2. Psychosis, unspecified psychosis type 298.9 F29     Past Medical History:  Past Medical History  Diagnosis Date  . Hypertension   . Aneurysm of artery ([place])     middle cerebral  . Seizures ([place])   . Seasonal allergies   . Insomnia   . Visual hallucinations     Past Surgical History  Procedure Laterality Date  . Back surgery    . Brain surgery    . Knee surgery Left   . Colonoscopy with propofol N/A 01/16/2015    Procedure: COLONOSCOPY WITH PROPOFOL;  Surgeon: Hulen Luster, MD;  Location: [facility] ENDOSCOPY;  Service: Gastroenterology;  Laterality: N/A;   Family  History: History reviewed. No pertinent family history. Social History:  Social History   Social History  . Marital Status: Widowed    Spouse Name: N/A  . Number of Children: N/A  . Years of Education: N/A   Social History Main Topics  . Smoking status: Never Smoker   . Smokeless tobacco: Never Used  . Alcohol Use: No  . Drug Use: No  . Sexual Activity: Not Currently   Other Topics Concern  . None   Social History Narrative   Additional History:   Assessment:   Musculoskeletal: Strength & Muscle Tone: within normal limits Gait & Station: normal Patient leans: N/A  Psychiatric Specialty Exam: HPI  ROS  Blood pressure 122/78, pulse 104, temperature 98.7 F (37.1 C), temperature source Tympanic, height 5' 11.5" (1.816 m), weight 214 lb 6.4 oz (97.251 kg), SpO2 96 %.Body mass index is 29.49 kg/(m^2).  General Appearance: Well Groomed  Eye Contact:  Good  Speech:  Normal Rate  Volume:  Normal  Mood:  Okay  Affect:  Congruent  Thought Process:  Linear and Logical  Orientation:  Full (Time, Place, and Person)  Thought Content:  Negative  Suicidal Thoughts:  No  Homicidal Thoughts:  No  Memory:  Immediate;   Good Recent;   Good Remote;   Good  Judgement:  Good  Insight:  Good  Psychomotor Activity:  Negative  Concentration:  Good  Recall:  Good  Fund of Knowledge: Good  Language: Good  Akathisia:  Negative  Handed:    AIMS (if indicated):  Done 05/15/2015 and normal   Assets:  Communication Skills Desire for Improvement Social Support  ADL's:  Intact  Cognition: Impaired,  Moderate  Sleep:  Poor, he relates over past week he may wake up between the hours of 3 and 7    Is the patient at risk to self?  No. Has the patient been a risk to self in the past 6 months?  No. Has the patient been a risk to self within the distant past?  No. Is the patient a risk to others?  No. Has the patient been a risk to others in the past 6 months?  No. Has the patient been  a risk to others within the distant past?  No.  Current Medications: Current Outpatient Prescriptions  Medication Sig Dispense Refill  . Alpha-Lipoic Acid 300 MG CAPS Take 300 mg by mouth every morning. Reported on 09/24/2015    . ARIPiprazole (ABILIFY) 5 MG tablet Take 1 tablet (5 mg total) by mouth daily. 30 tablet 4  . Armodafinil (NUVIGIL) 150 MG tablet Take 150 mg by mouth daily.    Marland Kitchen BACILLUS COAGULANS-INULIN PO Take by mouth every morning. Reported on 05/15/2015    . celecoxib (CELEBREX) 200 MG capsule Take 1 capsule by mouth daily.    . Chlorophyll-Alfalfa 20-100 MG TABS Take 340 mg by mouth Nightly. Reported on 05/15/2015    . cyclobenzaprine (FLEXERIL) 10 MG tablet Take 10 mg by mouth. Reported on 05/15/2015    . diclofenac (CATAFLAM) 50 MG tablet Take 50 mg by mouth 2 (two) times daily. Reported on 05/15/2015    . diclofenac (VOLTAREN) 50 MG EC tablet Take by mouth.    . gabapentin (NEURONTIN) 300 MG capsule Take 300 mg by mouth at bedtime. Reported on 07/03/2015    . hyoscyamine (LEVSIN, ANASPAZ) 0.125 MG tablet Take 1 tablet by mouth every 6 (six) hours as needed. Reported on 07/03/2015    . lamoTRIgine (LAMICTAL) 150 MG tablet Take 1 tablet by mouth 2 (two) times daily.    Marland Kitchen lisinopril (PRINIVIL,ZESTRIL) 5 MG tablet TAKE 1 TABLET BY MOUTH EVERY DAY 30 tablet 5  . Melatonin 10 MG CAPS Take 1 capsule by mouth at bedtime.    . mirtazapine (REMERON) 30 MG tablet Take 1 tablet (30 mg total) by mouth at bedtime. 30 tablet 4  . QUDEXY XR 150 MG CS24 TK 1 C PO QHS  11  . rizatriptan (MAXALT) 5 MG tablet Take 5 mg by mouth as needed for migraine. May repeat in 2 hours if needed    . SUMAtriptan (IMITREX) 100 MG tablet Take 100 mg by mouth every 2 (two) hours as needed for migraine. May repeat in 2 hours if headache persists or recurs.    . Suvorexant 10 MG TABS Take 10 mg by mouth Nightly. Reported on 07/03/2015    . traMADol (ULTRAM) 50 MG tablet TAKE 1 TABLET BY MOUTH EVERY 4 TO 6 HOURS AS  NEEDED FOR PAIN  0  . traZODone (DESYREL) 50 MG tablet Take two to three tablets at bedtime for sleep. 90 tablet 4   No current facility-administered medications for this visit.    Medical Decision Making:  Established Problem, Stable/Improving (1), Review of Medication Regimen & Side Effects (2) and Review of New Medication or Change in Dosage (2)  Treatment Plan Summary:Medication  management and Plan plan  Continue Abilify 5 mg by mouth daily. Continue mirtazapine 30 mg by mouth daily at bedtime Continue trazodone 150 mg by mouth daily at bedtime for insomnia  Follow-up in 2 months or earlier   More than 50% of the time spent in psychoeducation, counseling and coordination of care.    This note was generated in part or whole with voice recognition software. Voice regonition is usually quite accurate but there are transcription errors that can and very often do occur. I apologize for any typographical errors that were not detected and corrected.   Rainey Pines, MD  09/24/2015, 1:18 PM

## 2015-10-23 ENCOUNTER — Other Ambulatory Visit: Payer: Self-pay | Admitting: *Deleted

## 2015-10-23 MED ORDER — LISINOPRIL 5 MG PO TABS: 5.0000 mg | ORAL_TABLET | Freq: Every day | ORAL | Status: DC

## 2015-10-23 NOTE — Telephone Encounter (Signed)
Requesting 90 day supply.

## 2015-11-12 ENCOUNTER — Encounter: Payer: Self-pay | Admitting: *Deleted

## 2015-11-12 ENCOUNTER — Other Ambulatory Visit: Payer: BLUE CROSS/BLUE SHIELD

## 2015-11-12 NOTE — Pre-Procedure Instructions (Signed)
Kem Boroughs, MD - 07/14/2015 9:20 AM EST Formatting of this note may be different from the original. CHIEF COMPLAINT:  Seizures.   HISTORY OF PRESENT ILLNESS:  Dustin Paul is a 55 year old, right-handed, Caucasian male with a history of bilateral middle cerebral artery aneurysms status post clipping, seizure disorder who is returning to clinic for a scheduled follow up appointment. He was last seen 3/16.  Today he returns to clinic alone. He did have his vascular imaging repeated in November of 2016 and stable as below.  IMPRESSION:  The patient is status post clipping of bilateral MCA aneurysms. No significant change as compared to the previous study. No acute findings.  No seizures since here last. No hallucinations and the Abilify 5 mg at night seems to be working. Still having the headaches and taking Diclofenac if mild and Sumatriptan if severe. These are both prescribed by the [facility]. Taking Trokendi XR 150 as headache preventative. He is taking Trazodone and Remeron for sleep and this is helping. This is prescribed by psychiatry and he is getting a new psychiatrist as his retired. He is now taking Modafinil 150. Not taking Lorazepam. Left knee has been a problem. Will be seeing orthopedics for this. He has applied for disability and has a court date in May, 2017. Not working.  Past Medical History  Diagnosis Date  . Aneurysm of middle cerebral artery 11/11  bilateral  . Hypertension  . Insomnia  . Lumbar compression fracture  during seizure  . Seasonal allergies  . Seizures  . Spells of formed visual hallucinations  admitted for spell characterization 3/15-nonepileptic  . Tubular adenoma of colon 01/16/2015   Current Outpatient Prescriptions  Medication Sig Dispense Refill  . ABILIFY 5 mg tablet Take 5 mg by mouth once daily. 1  . ALFALFA ORAL Take 340 mg by mouth nightly.  Marland Kitchen alpha lipoic acid 300 mg capsule Take 300 mg by mouth daily. Ceraplex tablet:  ALA+Curcumin+Methylcobalamin+M-Acethylcristine+ Quercutin+Resveratrol  . BACILLUS COAGULANS/INULIN (PROBIOTIC WITH PREBIOTIC ORAL) Take 1 tablet by mouth daily.  . celecoxib (CELEBREX) 200 MG capsule TAKE 1 CAPSULE(200 MG) BY MOUTH EVERY DAY 30 capsule 0  . diclofenac potassium (CATAFLAM) 50 mg tablet Take 50 mg by mouth 2 (two) times daily with meals.  Sherril Cong (DIGESTIVE ENZYMES ORAL) Take 2 capsules by mouth 3 (three) times daily with meals.  . fexofenadine (ALLEGRA) 180 MG tablet 1 tab by mouth daily  . Herbal Supplement Herbal Name: Earth Harvest Vitamin Powder, 1 scoop mixed in 4oz of fluid by mouth daily  . lamoTRIgine (LAMICTAL) 150 MG tablet TAKE 1 TABLET BY MOUTH TWICE DAILY 60 tablet 3  . lisinopril (PRINIVIL,ZESTRIL) 5 MG tablet Take 5 mg by mouth once daily. 2  . mirtazapine (REMERON) 30 MG tablet TK 1 T PO QHS 4  . modafinil (PROVIGIL) 100 MG tablet Take 150 mg by mouth once daily.  Marland Kitchen omega-3 fatty acids 1,000 mg Cap 1 tab by mouth daily  . SUMAtriptan (IMITREX) 100 MG tablet Take 100 mg by mouth once as needed for Migraine. May take a second dose after 2 hours if needed.  . traZODone (DESYREL) 50 MG tablet On hold 5  . TROKENDI XR 100 mg Cp24 Take 1 tablet by mouth once daily.  Marland Kitchen TROKENDI XR 50 mg Cp24 Take 1 tablet by mouth once daily.  Marland Kitchen VITAMIN D2 50,000 unit capsule Take 50,000 Units by mouth once a week. 0  . cyclobenzaprine (FLEXERIL) 10 MG tablet Take 1 tablet (  10 mg total) by mouth nightly as needed for Muscle spasms. (Patient not taking: Reported on 07/14/2015 ) 30 tablet 3  . LORazepam (ATIVAN) 1 MG tablet Take 1 mg by mouth 3 (three) times daily. Reported on 07/14/2015 0  . NUVIGIL 150 mg tablet Take 150 mg by mouth once daily. Reported on 07/14/2015 5  . suvorexant 10 mg Tab Take 1 tablet by mouth nightly.  . zonisamide (ZONEGRAN) 50 MG capsule 1 CAPSULE AT BEDTIME X1 WEEK-- 2 CAPS X1 WEEK --3 CAPS X1 WEEK--4 CAPSULES AT BEDTIME AS DIRECTED (Patient not  taking: Reported on 07/14/2015) 120 capsule 0   No current facility-administered medications for this visit.   Allergies  Allergen Reactions  . Aspirin Other (See Comments)  Thins blood enough to cause nosebleeds and leakage from small tears in skin(i.e. Around nails)   Social History   Social History  . Marital status: Married  Spouse name: N/A  . Number of children: N/A  . Years of education: N/A   Occupational History  . Not on file.   Social History Main Topics  . Smoking status: Never Smoker  . Smokeless tobacco: Never Used  . Alcohol use No  . Drug use: No  . Sexual activity: Not on file   Other Topics Concern  . Not on file   Social History Narrative  His wife died with breast cancer, widow. Lives alone. Two grown children. 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   Vitals:  07/14/15 0915  BP: 113/77  Pulse: 73  Temp: 36.8 C (98.3 F)   Gen: WM NAD. HEENT: Scar of L temporal/parietal region 2/2 previous surgery. Oropharynx clear, MMM Neuro: MS: alert, fluent speech, able to answer questions and follows commands. Affect normal. CN: perrl, full eom, vff, face symmetric, sens intact, palate elevates and tongue midline, shoulder shrug intact Motor: full strength in arms and legs, no drift Sens: intact light touch arms and legs Coor: FTN intact, HTS intact, finger taps intact Gait: normal casual gait.  IMPRESSION/PLAN:  1). Seizures. Mr. Dustin Paul has not had any further GTC seizures. He is tolerating the Lamictal well and will continue on this 150 mg b.i.d. This was refilled. 2) spells of formed visual hallucinations-spell captured and no eeg correlate. The hallucinations are better since starting the Abilify. He is following with psychiatry and has appointment with a new psychiatrist as his has retired. 3). Insomnia. Not sleeping well with Abilify. He is now on Mirtazapine 30 and Trazodone 50 mg.  4). Headaches, bitemporal. Dustin Paul has had an  extensive work-up for his headaches including TCD and carotid ultrasound. Imaging has been stable, including the MRA 11/16. Better since increasing the Trokendi. [facility]. 5). History of B/L MCA aneurysms s/p clipping in 2012 -recent MRA stable 11/16 6) memory difficulty and executive inefficiencies-stable.  RTC 9 months  I personally performed the service.  Tana Conch, MD    Back to top of Progress Notes Plan of Treatment - as of this encounter Upcoming Encounters Upcoming Encounters  Date Type Specialty Care Team Description  11/20/2015 OnBase Orders Only     11/24/2015 Post Op Orthopaedics Lauris Poag, MD  [address]  [facility] [place], [place] [address]  [phone]  [phone] ([address])   Damaris Hippo [place], [place] [address]  Katheren Shams  [place], [place] [address]  [phone]  [phone] (Fax)    12/05/2015 Post Op Orthopaedics Feliberto Gottron,  PA  [place], [place] [address]  [phone]  [phone] (Fax)    04/13/2016 Office Visit Neurology Kem Boroughs, MD  [place], [place] [address]  [phone]  [phone] (Fax)    Visit Diagnoses - in this encounter Diagnosis  Chronic tension-type headache, not intractable - Primary  Chronic tension type headache   Generalized seizure disorder (CMS-HCC)  Unspecified epilepsy without mention of intractable epilepsy   Brain aneurysm  Cerebral aneurysm, nonruptured   Seizures (CMS-HCC)  Other convulsions   Memory loss  Discontinued Medications - as of this encounter Prescription Sig. Discontinue Reason Start Date End Date  gabapentin (NEURONTIN) 300 MG capsule  Indications: Insomnia due to medical condition Take 1 capsule in the evening before bed. Increase to 2 tabs in 3 days then increase to 3 tabs in 3 days. Therapy completed 08/29/2014 07/14/2015   peg-electrolyte (NULYTELY) solution  Take as directed for colonic prep. Therapy completed 12/30/2014 07/14/2015  zonisamide (ZONEGRAN) 50 MG capsule  1 CAPSULE AT BEDTIME X1 WEEK-- 2 CAPS X1 WEEK --3 CAPS X1 WEEK--4 CAPSULES AT BEDTIME AS DIRECTED Alternate therapy 03/26/2014 07/14/2015  lamoTRIgine (LAMICTAL) 150 MG tablet  Indications: Seizures (CMS-HCC) TAKE 1 TABLET BY MOUTH TWICE DAILY Reorder 04/02/2015 07/14/2015  Historical Medications - added in this encounter This list may reflect changes made after this encounter.  Medication Sig. Disp. Refills Start Date End Date  modafinil (PROVIGIL) 100 MG tablet  Take 150 mg by mouth once daily.    10/10/2015  mirtazapine (REMERON) 30 MG tablet  TK 1 T PO QHS  4 06/16/2015 10/10/2015  Document Information Service Providers Document Coverage Dates Feb. 13, 2017 - Feb. 13, 2017 [place] [phone] (Work) [place], [place] [address] Encounter Providers Kem Boroughs MD (Attending) [phone] (Work) [phone] (Fax)  [address] [place], [place] [address]

## 2015-11-12 NOTE — Patient Instructions (Signed)
  Your procedure is scheduled on: 11-20-15 (THURSDAY) Report to [place] To find out your arrival time please call [phone] between 1PM - 3PM on 11-19-15 [facility])  Remember: Instructions that are not followed completely may result in serious medical risk, up to and including death, or upon the discretion of your surgeon and anesthesiologist your surgery may need to be rescheduled.    _X___ 1. Do not eat food or drink liquids after midnight. No gum chewing or hard candies.     _X___ 2. No Alcohol for 24 hours before or after surgery.   ____ 3. Do Not Smoke For 24 Hours Prior to Your Surgery.   ____ 4. Bring all medications with you on the day of surgery if instructed.    _X___ 5. Notify your doctor if there is any change in your medical condition     (cold, fever, infections).       Do not wear jewelry, make-up, hairpins, clips or nail polish.  Do not wear lotions, powders, or perfumes. You may wear deodorant.  Do not shave 48 hours prior to surgery. Men may shave face and neck.  Do not bring valuables to the hospital.    [facility] is not responsible for any belongings or valuables.               Contacts, dentures or bridgework may not be worn into surgery.  Leave your suitcase in the car. After surgery it may be brought to your room.  For patients admitted to the hospital, discharge time is determined by your treatment team.   Patients discharged the day of surgery will not be allowed to drive home.   Please read over the following fact sheets that you were given:     _X___ Take these medicines the morning of surgery with A SIP OF WATER:    1. LISINOPRIL  2. LAMICTAL (LAMOTRIGENE)  3.   4.  5.  6.  ____ Fleet Enema (as directed)   _X___ Use CHG Soap as directed  ____ Use inhalers on the day of surgery  ____ Stop metformin 2 days prior to surgery    ____ Take 1/2 of usual insulin dose the night before surgery and none on the  morning of surgery.   ____ Stop Coumadin/Plavix/aspirin-N/A  _X___ Stop Anti-inflammatories-STOP DICLOFENAC NOW-NO NSAIDS OR ASPIRIN PRODUCTS-CELEBREX OK TO CONTINUE IF NEEDED   _X___ Stop supplements until after surgery-STOP ALPHA-LIPOIC ACID, CHLOROPHYLL-ALFALFA AND MELATONIN NOW   ____ Bring C-Pap to the hospital.

## 2015-11-13 ENCOUNTER — Encounter
Admission: RE | Admit: 2015-11-13 | Discharge: 2015-11-13 | Disposition: A | Discharge status: Home / Self Care | Payer: BLUE CROSS/BLUE SHIELD | Source: Ambulatory Visit | Attending: Orthopedic Surgery | Admitting: Orthopedic Surgery

## 2015-11-13 DIAGNOSIS — Z01810 Encounter for preprocedural cardiovascular examination: Secondary | ICD-10-CM | POA: Diagnosis not present

## 2015-11-13 DIAGNOSIS — I10 Essential (primary) hypertension: Secondary | ICD-10-CM | POA: Insufficient documentation

## 2015-11-18 ENCOUNTER — Ambulatory Visit (INDEPENDENT_AMBULATORY_CARE_PROVIDER_SITE_OTHER): Payer: BLUE CROSS/BLUE SHIELD | Admitting: Psychiatry

## 2015-11-18 ENCOUNTER — Encounter: Payer: Self-pay | Admitting: Psychiatry

## 2015-11-18 VITALS — BP 122/78 | HR 85 | Temp 97.6°F | Ht 71.5 in | Wt 215.2 lb

## 2015-11-18 DIAGNOSIS — F489 Nonpsychotic mental disorder, unspecified: Secondary | ICD-10-CM

## 2015-11-18 DIAGNOSIS — F5105 Insomnia due to other mental disorder: Secondary | ICD-10-CM

## 2015-11-18 DIAGNOSIS — F39 Unspecified mood [affective] disorder: Secondary | ICD-10-CM | POA: Diagnosis not present

## 2015-11-18 MED ORDER — MIRTAZAPINE 30 MG PO TABS: 30.0000 mg | ORAL_TABLET | Freq: Every day | ORAL | Status: DC

## 2015-11-18 MED ORDER — TRAZODONE HCL 150 MG PO TABS: 150.0000 mg | ORAL_TABLET | Freq: Every evening | ORAL | Status: DC | PRN

## 2015-11-18 MED ORDER — ARIPIPRAZOLE 5 MG PO TABS: 5.0000 mg | ORAL_TABLET | Freq: Every day | ORAL | Status: DC

## 2015-11-18 NOTE — Progress Notes (Signed)
BH MD/PA/NP OP Progress Note  11/18/2015 2:08 PM Dustin Paul  MRN:  [id number]  Subjective:  Patient is a 55 year old male with history of mood disorder  and insomnia who presented for follow-up. He was previously following Dr. Jimmye Norman. He reported that he is for his knee surgery in 2 days. Patient reported that he has chronic knee pain related to his previous fall. He is excited about the same. He appeared calm and collective. Patient reported that he continues to have problems with sleep and has been taking mirtazapine and trazodone and melatonin at night. He also takes Nuvigil in the morning as he reported that he is unable to keep his eyes open during the daytime. We discuss about his medications in detail. He reported that he has been tried on several medications for insomnia in the past but they were not effective. However he is unable to find the right combination. He reported that the Nuvigil helps him keep awake during the daytime. He remains anxious during the interview. He currently denied having any suicidal ideations or plans. Denied having any perceptual disturbances.     Chief Complaint: Chief Complaint    Follow-up; Medication Refill     Visit Diagnosis:     ICD-9-CM ICD-10-CM   1. Episodic mood disorder ([place]) 296.90 F39   2. Insomnia due to mental condition 300.9 F48.9    327.02 F51.05     Past Medical History:  Past Medical History  Diagnosis Date  . Hypertension   . Aneurysm of artery ([place]) 2011    LEFT (19 MM) AND RIGHT (11 MM) middle cerebral  . Seasonal allergies   . Insomnia   . Visual hallucinations   . Chronic kidney disease     H/O STONES  . Lumbar stress fracture   . Complication of anesthesia   . PONV (postoperative nausea and vomiting)     AFTER KNEE SURGERY IN 1980'S  . Seizures ([place])     last seizure in 2015  . Headache     MIGRAINES    Past Surgical History  Procedure Laterality Date  . Knee surgery Left   . Colonoscopy with propofol  N/A 01/16/2015    Procedure: COLONOSCOPY WITH PROPOFOL;  Surgeon: Hulen Luster, MD;  Location: [facility] END[facility]COPY;  Service: Gastroenterology;  Laterality: N/A;  . Brain surgery  2011    2 ANEURYSMS-TOTAL OF 9 CLIPS IN BRAIN FROM ANEURYSM   Family History: History reviewed. No pertinent family history. Social History:  Social History   Social History  . Marital Status: Widowed    Spouse Name: N/A  . Number of Children: N/A  . Years of Education: N/A   Social History Main Topics  . Smoking status: Never Smoker   . Smokeless tobacco: Never Used  . Alcohol Use: No  . Drug Use: No  . Sexual Activity: Not Currently   Other Topics Concern  . None   Social History Narrative   Additional History:   Assessment:   Musculoskeletal: Strength & Muscle Tone: within normal limits Gait & Station: normal Patient leans: N/A  Psychiatric Specialty Exam: HPI  ROS  Blood pressure 122/78, pulse 85, temperature 97.6 F (36.4 C), temperature source Tympanic, height 5' 11.5" (1.816 m), weight 215 lb 3.2 oz (97.614 kg), SpO2 96 %.Body mass index is 29.6 kg/(m^2).  General Appearance: Well Groomed  Eye Contact:  Good  Speech:  Normal Rate  Volume:  Normal  Mood:  Okay  Affect:  Congruent  Thought Process:  Linear and Logical  Orientation:  Full (Time, Place, and Person)  Thought Content:  Negative  Suicidal Thoughts:  No  Homicidal Thoughts:  No  Memory:  Immediate;   Good Recent;   Good Remote;   Good  Judgement:  Good  Insight:  Good  Psychomotor Activity:  Negative  Concentration:  Good  Recall:  Good  Fund of Knowledge: Good  Language: Good  Akathisia:  Negative  Handed:    AIMS (if indicated):  Done 05/15/2015 and normal   Assets:  Communication Skills Desire for Improvement Social Support  ADL's:  Intact  Cognition: Impaired,  Moderate  Sleep:  Poor, he relates over past week he may wake up between the hours of 3 and 7    Is the patient at risk to self?  No. Has the  patient been a risk to self in the past 6 months?  No. Has the patient been a risk to self within the distant past?  No. Is the patient a risk to others?  No. Has the patient been a risk to others in the past 6 months?  No. Has the patient been a risk to others within the distant past?  No.  Current Medications: Current Outpatient Prescriptions  Medication Sig Dispense Refill  . Alpha-Lipoic Acid 300 MG CAPS Take 300 mg by mouth every evening. Reported on 09/24/2015    . ARIPiprazole (ABILIFY) 5 MG tablet Take 1 tablet (5 mg total) by mouth daily. 30 tablet 1  . Armodafinil 200 MG TABS Take 1 tablet by mouth daily.  5  . celecoxib (CELEBREX) 200 MG capsule Take 1 capsule by mouth as needed.     . Chlorophyll-Alfalfa 20-100 MG TABS Take 340 mg by mouth Nightly. Reported on 05/15/2015    . cyclobenzaprine (FLEXERIL) 10 MG tablet Take 10 mg by mouth 3 (three) times daily as needed. Reported on 05/15/2015    . diclofenac (VOLTAREN) 50 MG EC tablet Take 50 mg by mouth as needed.     . lamoTRIgine (LAMICTAL) 150 MG tablet Take 1 tablet (150 mg total) by mouth 2 (two) times daily. 60 tablet 2  . lisinopril (PRINIVIL,ZESTRIL) 5 MG tablet Take 1 tablet (5 mg total) by mouth daily. (Patient taking differently: Take 5 mg by mouth every morning. ) 90 tablet 4  . Melatonin 10 MG CAPS Take 1 capsule by mouth at bedtime.    . mirtazapine (REMERON) 30 MG tablet Take 1 tablet (30 mg total) by mouth at bedtime. 30 tablet 1  . QUDEXY XR 150 MG CS24 take 1 capsule by mouth daily at bedtime  11  . rizatriptan (MAXALT) 5 MG tablet Take 5 mg by mouth as needed for migraine. May repeat in 2 hours if needed    . SUMAtriptan (IMITREX) 100 MG tablet Take 100 mg by mouth every 2 (two) hours as needed for migraine. May repeat in 2 hours if headache persists or recurs.    . traMADol (ULTRAM) 50 MG tablet TAKE 1 TABLET BY MOUTH EVERY 4 TO 6 HOURS AS NEEDED FOR PAIN  0  . traZODone (DESYREL) 150 MG tablet Take 1 tablet (150  mg total) by mouth at bedtime as needed for sleep. 30 tablet 1   No current facility-administered medications for this visit.    Medical Decision Making:  Established Problem, Stable/Improving (1), Review of Medication Regimen & Side Effects (2) and Review of New Medication or Change in Dosage (2)  Treatment Plan Summary:Medication management and Plan plan  Continue Abilify 5 mg by mouth daily. Continue mirtazapine 30 mg by mouth daily at bedtime Continue trazodone 150 mg by mouth daily at bedtime for insomnia  Advised patient that he should decrease the dose of his medication including Nuvigil mirtazapine and trazodone and he agreed with the plan. He reported that he will follow up in 2 months or earlier depending on her symptoms and will  adjusting the dose of his medications    More than 50% of the time spent in psychoeducation, counseling and coordination of care.    This note was generated in part or whole with voice recognition software. Voice regonition is usually quite accurate but there are transcription errors that can and very often do occur. I apologize for any typographical errors that were not detected and corrected.   Rainey Pines, MD  11/18/2015, 2:08 PM

## 2015-11-20 ENCOUNTER — Encounter
Admission: RE | Disposition: A | Discharge status: Home / Self Care | Payer: Self-pay | Source: Ambulatory Visit | Attending: Orthopedic Surgery

## 2015-11-20 ENCOUNTER — Ambulatory Visit
Admission: RE | Admit: 2015-11-20 | Discharge: 2015-11-20 | Disposition: A | Discharge status: Home / Self Care | Payer: BLUE CROSS/BLUE SHIELD | Source: Ambulatory Visit | Attending: Orthopedic Surgery | Admitting: Orthopedic Surgery

## 2015-11-20 ENCOUNTER — Ambulatory Visit: Payer: BLUE CROSS/BLUE SHIELD | Admitting: Anesthesiology

## 2015-11-20 ENCOUNTER — Encounter: Payer: Self-pay | Admitting: Anesthesiology

## 2015-11-20 DIAGNOSIS — Z886 Allergy status to analgesic agent status: Secondary | ICD-10-CM | POA: Diagnosis not present

## 2015-11-20 DIAGNOSIS — S83242A Other tear of medial meniscus, current injury, left knee, initial encounter: Secondary | ICD-10-CM | POA: Insufficient documentation

## 2015-11-20 DIAGNOSIS — Z888 Allergy status to other drugs, medicaments and biological substances status: Secondary | ICD-10-CM | POA: Insufficient documentation

## 2015-11-20 DIAGNOSIS — G4700 Insomnia, unspecified: Secondary | ICD-10-CM | POA: Insufficient documentation

## 2015-11-20 DIAGNOSIS — I10 Essential (primary) hypertension: Secondary | ICD-10-CM | POA: Insufficient documentation

## 2015-11-20 DIAGNOSIS — M2342 Loose body in knee, left knee: Secondary | ICD-10-CM | POA: Diagnosis not present

## 2015-11-20 DIAGNOSIS — Z79899 Other long term (current) drug therapy: Secondary | ICD-10-CM | POA: Diagnosis not present

## 2015-11-20 DIAGNOSIS — I739 Peripheral vascular disease, unspecified: Secondary | ICD-10-CM | POA: Diagnosis not present

## 2015-11-20 DIAGNOSIS — I499 Cardiac arrhythmia, unspecified: Secondary | ICD-10-CM | POA: Diagnosis not present

## 2015-11-20 DIAGNOSIS — M1712 Unilateral primary osteoarthritis, left knee: Secondary | ICD-10-CM | POA: Insufficient documentation

## 2015-11-20 HISTORY — DX: Nausea with vomiting, unspecified: R11.2

## 2015-11-20 HISTORY — DX: Fatigue fracture of vertebra, lumbar region, initial encounter for fracture: M48.46XA

## 2015-11-20 HISTORY — DX: Chronic kidney disease, unspecified: N18.9

## 2015-11-20 HISTORY — PX: KNEE ARTHROSCOPY WITH MEDIAL MENISECTOMY: SHX5651

## 2015-11-20 HISTORY — DX: Headache, unspecified: R51.9

## 2015-11-20 HISTORY — DX: Adverse effect of unspecified anesthetic, initial encounter: T41.45XA

## 2015-11-20 HISTORY — DX: Other specified postprocedural states: Z98.890

## 2015-11-20 HISTORY — DX: Headache: R51

## 2015-11-20 HISTORY — DX: Other complications of anesthesia, initial encounter: T88.59XA

## 2015-11-20 SURGERY — ARTHROSCOPY, KNEE, WITH MEDIAL MENISCECTOMY
Anesthesia: General | Laterality: Left

## 2015-11-20 MED ORDER — PROPOFOL 10 MG/ML IV BOLUS
INTRAVENOUS | Status: DC | PRN
  Administered 2015-11-20: 180 mg via INTRAVENOUS

## 2015-11-20 MED ORDER — FENTANYL CITRATE (PF) 100 MCG/2ML IJ SOLN
INTRAMUSCULAR | Status: DC | PRN
  Administered 2015-11-20: 100 ug via INTRAVENOUS

## 2015-11-20 MED ORDER — ACETAMINOPHEN 10 MG/ML IV SOLN
INTRAVENOUS | Status: AC
  Filled 2015-11-20: qty 100

## 2015-11-20 MED ORDER — MIDAZOLAM HCL 2 MG/2ML IJ SOLN
INTRAMUSCULAR | Status: DC | PRN
  Administered 2015-11-20: 2 mg via INTRAVENOUS

## 2015-11-20 MED ORDER — ONDANSETRON HCL 4 MG/2ML IJ SOLN
INTRAMUSCULAR | Status: DC | PRN
  Administered 2015-11-20: 4 mg via INTRAVENOUS

## 2015-11-20 MED ORDER — FAMOTIDINE 20 MG PO TABS
20.0000 mg | ORAL_TABLET | Freq: Once | ORAL | Status: AC
  Administered 2015-11-20: 20 mg via ORAL

## 2015-11-20 MED ORDER — LACTATED RINGERS IV SOLN
INTRAVENOUS | Status: DC
  Administered 2015-11-20 (×2): via INTRAVENOUS

## 2015-11-20 MED ORDER — ONDANSETRON HCL 4 MG/2ML IJ SOLN: 4.0000 mg | Freq: Once | INTRAMUSCULAR | Status: DC | PRN

## 2015-11-20 MED ORDER — DEXAMETHASONE SODIUM PHOSPHATE 10 MG/ML IJ SOLN
INTRAMUSCULAR | Status: DC | PRN
  Administered 2015-11-20: 10 mg via INTRAVENOUS

## 2015-11-20 MED ORDER — LIDOCAINE HCL (CARDIAC) 20 MG/ML IV SOLN
INTRAVENOUS | Status: DC | PRN
  Administered 2015-11-20: 100 mg via INTRAVENOUS

## 2015-11-20 MED ORDER — FENTANYL CITRATE (PF) 100 MCG/2ML IJ SOLN: 25.0000 ug | INTRAMUSCULAR | Status: DC | PRN

## 2015-11-20 MED ORDER — BUPIVACAINE-EPINEPHRINE (PF) 0.5% -1:200000 IJ SOLN
INTRAMUSCULAR | Status: DC | PRN
  Administered 2015-11-20: 20 mL via PERINEURAL

## 2015-11-20 MED ORDER — ACETAMINOPHEN 10 MG/ML IV SOLN
INTRAVENOUS | Status: DC | PRN
  Administered 2015-11-20: 1000 mg via INTRAVENOUS

## 2015-11-20 MED ORDER — BUPIVACAINE-EPINEPHRINE (PF) 0.5% -1:200000 IJ SOLN
INTRAMUSCULAR | Status: AC
  Filled 2015-11-20: qty 30

## 2015-11-20 MED ORDER — OXYCODONE-ACETAMINOPHEN 7.5-325 MG PO TABS: 1.0000 | ORAL_TABLET | ORAL | Status: DC | PRN

## 2015-11-20 MED ORDER — FAMOTIDINE 20 MG PO TABS
ORAL_TABLET | ORAL | Status: AC
  Filled 2015-11-20: qty 1

## 2015-11-20 SURGICAL SUPPLY — 29 items
BANDAGE ACE 4X5 VEL STRL LF (GAUZE/BANDAGES/DRESSINGS) ×3 IMPLANT
BANDAGE ELASTIC 4 LF NS (GAUZE/BANDAGES/DRESSINGS) ×3 IMPLANT
BLADE FULL RADIUS 3.5 (BLADE) IMPLANT
BLADE INCISOR PLUS 4.5 (BLADE) ×3 IMPLANT
BLADE SHAVER 4.5 DBL SERAT CV (CUTTER) IMPLANT
BLADE SHAVER 4.5X7 STR FR (MISCELLANEOUS) IMPLANT
CHLORAPREP W/TINT 26ML (MISCELLANEOUS) ×3 IMPLANT
CUFF TOURN 24 STER (MISCELLANEOUS) IMPLANT
CUFF TOURN 30 STER DUAL PORT (MISCELLANEOUS) IMPLANT
DRAPE C-ARM XRAY 36X54 (DRAPES) ×3 IMPLANT
DRAPE C-ARMOR (DRAPES) ×3 IMPLANT
GAUZE SPONGE 4X4 12PLY STRL (GAUZE/BANDAGES/DRESSINGS) ×3 IMPLANT
GAUZE XEROFORM 4X4 STRL (GAUZE/BANDAGES/DRESSINGS) ×3 IMPLANT
GLOVE SURG ORTHO 9.0 STRL STRW (GLOVE) ×3 IMPLANT
GOWN STRL REUS W/ TWL LRG LVL3 (GOWN DISPOSABLE) ×1 IMPLANT
GOWN STRL REUS W/TWL LRG LVL3 (GOWN DISPOSABLE) ×2
GOWN SURG XXL (GOWNS) ×3 IMPLANT
IV LACTATED RINGER IRRG 3000ML (IV SOLUTION) ×8
IV LR IRRIG 3000ML ARTHROMATIC (IV SOLUTION) ×4 IMPLANT
KIT RM TURNOVER STRD PROC AR (KITS) ×3 IMPLANT
MANIFOLD NEPTUNE II (INSTRUMENTS) ×3 IMPLANT
PACK ARTHROSCOPY KNEE (MISCELLANEOUS) ×3 IMPLANT
SET TUBE SUCT SHAVER OUTFL 24K (TUBING) ×3 IMPLANT
SET TUBE TIP INTRA-ARTICULAR (MISCELLANEOUS) ×3 IMPLANT
SUT ETHILON 4-0 (SUTURE) ×2
SUT ETHILON 4-0 FS2 18XMFL BLK (SUTURE) ×1
SUTURE ETHLN 4-0 FS2 18XMF BLK (SUTURE) ×1 IMPLANT
TUBING ARTHRO INFLOW-ONLY STRL (TUBING) ×3 IMPLANT
WAND HAND CNTRL MULTIVAC 50 (MISCELLANEOUS) ×3 IMPLANT

## 2015-11-20 NOTE — H&P (Signed)
Reviewed paper H+P, will be scanned into chart. No changes noted.  

## 2015-11-20 NOTE — Anesthesia Preprocedure Evaluation (Signed)
Anesthesia Evaluation  Patient identified by MRN, date of birth, ID band Patient awake    Reviewed: Allergy & Precautions, NPO status , Patient's Chart, lab work & pertinent test results, reviewed documented beta blocker date and time   History of Anesthesia Complications (+) PONV and history of anesthetic complications  Airway Mallampati: III  TM Distance: >3 FB     Dental  (+) Chipped   Pulmonary           Cardiovascular hypertension, Pt. on medications + Peripheral Vascular Disease  + dysrhythmias      Neuro/Psych  Headaches, Seizures -,     GI/Hepatic   Endo/Other    Renal/GU Renal InsufficiencyRenal disease     Musculoskeletal   Abdominal   Peds  Hematology   Anesthesia Other Findings   Reproductive/Obstetrics                             Anesthesia Physical Anesthesia Plan  ASA: III  Anesthesia Plan: General   Post-op Pain Management:    Induction: Intravenous  Airway Management Planned: LMA  Additional Equipment:   Intra-op Plan:   Post-operative Plan:   Informed Consent: I have reviewed the patients History and Physical, chart, labs and discussed the procedure including the risks, benefits and alternatives for the proposed anesthesia with the patient or authorized representative who has indicated his/her understanding and acceptance.     Plan Discussed with: CRNA  Anesthesia Plan Comments:         Anesthesia Quick Evaluation

## 2015-11-20 NOTE — Discharge Instructions (Addendum)
Weightbearing as tolerated to left leg. Keep bandage clean and dry. If bandage slides down the leg, remove entire bandage used to Band-Aids over the 2 incisions and then reapply Ace wrap. Take one aspirin a day 325 mg until prevent blood clots  AMBULATORY SURGERY  DISCHARGE INSTRUCTIONS  1) The drugs that you were given will stay in your system until tomorrow so for the next 24 hours you should not: A) Drive an automobile B) Make any legal decisions C) Drink any alcoholic beverage  2) You may resume regular meals tomorrow.  Today it is better to start with liquids and gradually work up to solid foods. You may eat anything you prefer, but it is better to start with liquids, then soup and crackers, and gradually work up to solid foods.  3) Please notify your doctor immediately if you have any unusual bleeding, trouble breathing, redness and pain at the surgery site, drainage, fever, or pain not relieved by medication.  4) Additional Instructions:  Please contact your physician with any problems or Same Day Surgery at [phone], Monday through Friday 6 am to 4 pm, or [place] at [facility] number at [phone].

## 2015-11-20 NOTE — Op Note (Signed)
11/20/2015  11:48 AM  PATIENT:  Dustin Paul  55 y.o. male  PRE-OPERATIVE DIAGNOSIS:  osteoarthritis,loose body,complex tear medial meniscus  POST-OPERATIVE DIAGNOSIS:  osteoarthritis,loose body,complex tear medial meniscus  PROCEDURE:  Procedure(s): KNEE ARTHROSCOPY WITH partial MEDIAL MENISECTOMY / WITH REMOVAL OF LOOSE BODY (Left)  SURGEON: Laurene Footman, MD  ASSISTANTS: None  ANESTHESIA:   general  EBL:  Total I/O In: 750 [I.V.:750] Out: 5 [Blood:5]  BLOOD ADMINISTERED:none  DRAINS: none   LOCAL MEDICATIONS USED:  MARCAINE     SPECIMEN:  No Specimen  DISPOSITION OF SPECIMEN:  N/A  COUNTS:  YES  TOURNIQUET:  * No tourniquets in log *  IMPLANTS: None  DICTATION: .Dragon Dictation patient brought the operating room and after adequate anesthesia was obtained, left leg was prepped and draped in sterile fashion with a tourniquet about scopic leg holder applied. After patient identification and timeout procedures were completed, inferior lateral portal was made and the scope was introduced. There is a great deal debris within the knee and crystalline material. After initial evaluation with the arthroscope and inferior medial portal was made and a probe introduced the showed a recurrent tear to the posterior horn of the medial meniscus consistent with MRI findings there is moderate degenerative change the medial compartment with some fissuring of the articular cartilage the anterior cruciate ligament was deficient with large spurs in the notch. Going lateral compartment there is exposed bone over most the femoral condyle and approximately 30 the tibial condyle with intact meniscus. There was a loose body present in the lateral gutter which was removed at this point. A meniscal punch and followed by shaver and [employer] wand were used to debride the medial meniscus tear also symptoms large crystalline material were removed as well the gutters were checked and after thorough  irrigation the small loose bodies and irrigated out all and through the shaver so additional large loose body is not removed the patellofemoral joint was normal tracking with fibrillation and fissuring of the articular cartilage throughout the patella its entire surface trochanter femoral trochlea had partial-thickness cartilage loss but no bone exposed. After thoroughly irrigating out the knee argentation was withdrawn. The wounds were closed with simple interrupted sutures and 10 cc of 1/2 Percent Sensorcaine with epinephrine was infiltrated in each portal for postop analgesia. Xeroform 4 x 4 web roll and Ace wrap applied  PLAN OF CARE: Discharge to home after PACU  PATIENT DISPOSITION:  PACU - hemodynamically stable.

## 2015-11-20 NOTE — Transfer of Care (Signed)
Immediate Anesthesia Transfer of Care Note  Patient: Dustin Paul  Procedure(s) Performed: Procedure(s): KNEE ARTHROSCOPY WITH partial MEDIAL MENISECTOMY / WITH REMOVAL OF LOOSE BODY (Left)  Patient Location: PACU  Anesthesia Type:General  Level of Consciousness: sedated  Airway & Oxygen Therapy: Patient Spontanous Breathing and Patient connected to face mask oxygen  Post-op Assessment: Report given to RN and Post -op Vital signs reviewed and stable  Post vital signs: Reviewed and stable  Last Vitals:  Filed Vitals:   11/20/15 1003 11/20/15 1140  BP: 113/77 121/76  Pulse: 74 74  Temp: 36.9 C 36.4 C  Resp: 14 12    Last Pain:  Filed Vitals:   11/20/15 1142  PainSc: 4          Complications: No apparent anesthesia complications

## 2015-11-20 NOTE — Anesthesia Procedure Notes (Signed)
Procedure Name: LMA Insertion Date/Time: 11/20/2015 10:30 AM Performed by: Nelda Marseille Pre-anesthesia Checklist: Patient identified, Patient being monitored, Timeout performed, Emergency Drugs available and Suction available Patient Re-evaluated:Patient Re-evaluated prior to inductionOxygen Delivery Method: Circle system utilized Preoxygenation: Pre-oxygenation with 100% oxygen Intubation Type: IV induction Ventilation: Mask ventilation without difficulty LMA: LMA inserted LMA Size: 4.5 Tube type: Oral Number of attempts: 1 Placement Confirmation: positive ETCO2 and breath sounds checked- equal and bilateral Tube secured with: Tape Dental Injury: Teeth and Oropharynx as per pre-operative assessment

## 2015-11-21 NOTE — Anesthesia Postprocedure Evaluation (Signed)
Anesthesia Post Note  Patient: Dustin Paul  Procedure(s) Performed: Procedure(s) (LRB): KNEE ARTHROSCOPY WITH partial MEDIAL MENISECTOMY / WITH REMOVAL OF LOOSE BODY (Left)  Patient location during evaluation: PACU Anesthesia Type: General Level of consciousness: awake and alert Pain management: pain level controlled Vital Signs Assessment: post-procedure vital signs reviewed and stable Respiratory status: spontaneous breathing, nonlabored ventilation, respiratory function stable and patient connected to nasal cannula oxygen Cardiovascular status: blood pressure returned to baseline and stable Postop Assessment: no signs of nausea or vomiting Anesthetic complications: no    Last Vitals:  Filed Vitals:   11/20/15 1228 11/20/15 1300  BP: 114/85 120/74  Pulse: 82 74  Temp: 35.9 C   Resp:  18    Last Pain:  Filed Vitals:   11/20/15 1311  PainSc: 2                  Valery Amedee S

## 2016-01-20 ENCOUNTER — Ambulatory Visit: Payer: No Typology Code available for payment source | Admitting: Psychiatry

## 2016-01-28 ENCOUNTER — Ambulatory Visit (INDEPENDENT_AMBULATORY_CARE_PROVIDER_SITE_OTHER): Payer: BLUE CROSS/BLUE SHIELD | Admitting: Psychiatry

## 2016-01-28 ENCOUNTER — Encounter: Payer: Self-pay | Admitting: Psychiatry

## 2016-01-28 VITALS — BP 143/91 | HR 106 | Temp 98.9°F | Ht 71.5 in | Wt 212.0 lb

## 2016-01-28 DIAGNOSIS — F489 Nonpsychotic mental disorder, unspecified: Secondary | ICD-10-CM | POA: Diagnosis not present

## 2016-01-28 DIAGNOSIS — F39 Unspecified mood [affective] disorder: Secondary | ICD-10-CM

## 2016-01-28 DIAGNOSIS — F5105 Insomnia due to other mental disorder: Secondary | ICD-10-CM

## 2016-01-28 MED ORDER — MIRTAZAPINE 30 MG PO TABS: 30.0000 mg | ORAL_TABLET | Freq: Every day | ORAL | 1 refills | Status: DC

## 2016-01-28 MED ORDER — ARIPIPRAZOLE 5 MG PO TABS: 5.0000 mg | ORAL_TABLET | Freq: Every day | ORAL | 1 refills | Status: DC

## 2016-01-28 MED ORDER — ARMODAFINIL 200 MG PO TABS: 1.0000 | ORAL_TABLET | Freq: Every day | ORAL | 5 refills | Status: DC

## 2016-01-28 MED ORDER — TRAZODONE HCL 150 MG PO TABS: 150.0000 mg | ORAL_TABLET | Freq: Every evening | ORAL | 1 refills | Status: DC | PRN

## 2016-01-28 MED ORDER — LAMOTRIGINE 150 MG PO TABS: 150.0000 mg | ORAL_TABLET | Freq: Two times a day (BID) | ORAL | 2 refills | Status: DC

## 2016-01-28 NOTE — Progress Notes (Signed)
BH MD/PA/NP OP Progress Note  01/28/2016 1:58 PM Dustin Paul  MRN:  [id number]  Subjective:  Patient is a 55 year old male with history of mood disorder  and insomnia who presented for follow-up.  He reported that he recently had his knee surgery in June. He reported that he recovered well from the surgery and is not having any pain at this time. Patient reported that he had chronic knee pain in the past and also had the anterior cruciate ligament tear. Patient reported that the surgery went well and he is not taking any pain medications at this time. He appeared calm during the interview. Patient reported that medication is helping him and he is stable on his current medications. He sleeping well at night. He is also very excited as he has been approved for disability and is going to get money. He reported that he also about the back pay for the past 3 years. He reported that his son lives in [place] as well as his mother lives in [place]. He is planning to live in and the [place] as he has been living by himself in [place]. He has been thinking about the same. He currently denied having any adverse reactions related to the medications. He denied having any suicidal homicidal ideations or plans.      Chief Complaint: Chief Complaint    Follow-up; Medication Refill     Visit Diagnosis:   No diagnosis found.  Past Medical History:  Past Medical History:  Diagnosis Date  . Aneurysm of artery ([place]) 2011   LEFT (19 MM) AND RIGHT (11 MM) middle cerebral  . Chronic kidney disease    H/O STONES  . Complication of anesthesia   . Headache    MIGRAINES  . Hypertension   . Insomnia   . Lumbar stress fracture   . PONV (postoperative nausea and vomiting)    AFTER KNEE SURGERY IN 1980'S  . Seasonal allergies   . Seizures ([place])    last seizure in 2015  . Visual hallucinations     Past Surgical History:  Procedure Laterality Date  . BRAIN SURGERY  2011   2 ANEURYSMS-TOTAL OF 9  CLIPS IN BRAIN FROM ANEURYSM  . COLONOSCOPY WITH PROPOFOL N/A 01/16/2015   Procedure: COLONOSCOPY WITH PROPOFOL;  Surgeon: Hulen Luster, MD;  Location: [facility] ENDOSCOPY;  Service: Gastroenterology;  Laterality: N/A;  . KNEE ARTHROSCOPY WITH MEDIAL MENISECTOMY Left 11/20/2015   Procedure: KNEE ARTHROSCOPY WITH partial MEDIAL MENISECTOMY / WITH REMOVAL OF LOOSE BODY;  Surgeon: Hessie Knows, MD;  Location: [facility] ORS;  Service: Orthopedics;  Laterality: Left;  . KNEE SURGERY Left    Family History: History reviewed. No pertinent family history. Social History:  Social History   Social History  . Marital status: Widowed    Spouse name: N/A  . Number of children: N/A  . Years of education: N/A   Social History Main Topics  . Smoking status: Never Smoker  . Smokeless tobacco: Never Used  . Alcohol use No  . Drug use: No  . Sexual activity: Not Currently   Other Topics Concern  . None   Social History Narrative  . None   Additional History:   Assessment:   Musculoskeletal: Strength & Muscle Tone: within normal limits Gait & Station: normal Patient leans: N/A  Psychiatric Specialty Exam: HPI  ROS  Blood pressure (!) 143/91, pulse (!) 106, temperature 98.9 F (37.2 C), temperature source Oral, height 5' 11.5" (1.816 m), weight 212 lb (  96.2 kg).Body mass index is 29.16 kg/m.  General Appearance: Well Groomed  Eye Contact:  Good  Speech:  Normal Rate  Volume:  Normal  Mood:  Okay  Affect:  Congruent  Thought Process:  Linear and Logical  Orientation:  Full (Time, Place, and Person)  Thought Content:  Negative  Suicidal Thoughts:  No  Homicidal Thoughts:  No  Memory:  Immediate;   Good Recent;   Good Remote;   Good  Judgement:  Good  Insight:  Good  Psychomotor Activity:  Negative  Concentration:  Good  Recall:  Good  Fund of Knowledge: Good  Language: Good  Akathisia:  Negative  Handed:    AIMS (if indicated):  Done 05/15/2015 and normal   Assets:  Communication  Skills Desire for Improvement Social Support  ADL's:  Intact  Cognition: Impaired,  Moderate  Sleep:  Poor, he relates over past week he may wake up between the hours of 3 and 7    Is the patient at risk to self?  No. Has the patient been a risk to self in the past 6 months?  No. Has the patient been a risk to self within the distant past?  No. Is the patient a risk to others?  No. Has the patient been a risk to others in the past 6 months?  No. Has the patient been a risk to others within the distant past?  No.  Current Medications: Current Outpatient Prescriptions  Medication Sig Dispense Refill  . Alpha-Lipoic Acid 300 MG CAPS Take 300 mg by mouth every evening. Reported on 09/24/2015    . ARIPiprazole (ABILIFY) 5 MG tablet Take 1 tablet (5 mg total) by mouth daily. 30 tablet 1  . Armodafinil 200 MG TABS Take 1 tablet by mouth daily.  5  . celecoxib (CELEBREX) 200 MG capsule Take 1 capsule by mouth as needed.     . Chlorophyll-Alfalfa 20-100 MG TABS Take 340 mg by mouth Nightly. Reported on 05/15/2015    . cyclobenzaprine (FLEXERIL) 10 MG tablet Take 10 mg by mouth 3 (three) times daily as needed. Reported on 05/15/2015    . diclofenac (VOLTAREN) 50 MG EC tablet Take 50 mg by mouth as needed.     . lamoTRIgine (LAMICTAL) 150 MG tablet Take 1 tablet (150 mg total) by mouth 2 (two) times daily. 60 tablet 2  . lisinopril (PRINIVIL,ZESTRIL) 5 MG tablet Take 1 tablet (5 mg total) by mouth daily. (Patient taking differently: Take 5 mg by mouth every morning. ) 90 tablet 4  . Melatonin 10 MG CAPS Take 1 capsule by mouth at bedtime.    . mirtazapine (REMERON) 30 MG tablet Take 1 tablet (30 mg total) by mouth at bedtime. 30 tablet 1  . oxyCODONE-acetaminophen (PERCOCET) 7.5-325 MG tablet Take 1 tablet by mouth every 4 (four) hours as needed for severe pain. 35 tablet 0  . QUDEXY XR 150 MG CS24 take 1 capsule by mouth daily at bedtime  11  . rizatriptan (MAXALT) 5 MG tablet Take 5 mg by mouth  as needed for migraine. May repeat in 2 hours if needed    . SUMAtriptan (IMITREX) 100 MG tablet Take 100 mg by mouth every 2 (two) hours as needed for migraine. May repeat in 2 hours if headache persists or recurs.    . traMADol (ULTRAM) 50 MG tablet TAKE 1 TABLET BY MOUTH EVERY 4 TO 6 HOURS AS NEEDED FOR PAIN  0  . traZODone (DESYREL) 150 MG tablet Take  1 tablet (150 mg total) by mouth at bedtime as needed for sleep. 30 tablet 1   No current facility-administered medications for this visit.     Medical Decision Making:  Established Problem, Stable/Improving (1), Review of Medication Regimen & Side Effects (2) and Review of New Medication or Change in Dosage (2)  Treatment Plan Summary:Medication management and Plan plan  Continue Abilify 5 mg by mouth daily. Continue mirtazapine 30 mg by mouth daily at bedtime Continue trazodone 150 mg by mouth daily at bedtime for insomnia He reported that he is taking half the dose of trazodone as well as of the Nuvigil.  Follow-up in 2 months or earlier depending on his symptoms   More than 50% of the time spent in psychoeducation, counseling and coordination of care.    This note was generated in part or whole with voice recognition software. Voice regonition is usually quite accurate but there are transcription errors that can and very often do occur. I apologize for any typographical errors that were not detected and corrected.   Rainey Pines, MD  01/28/2016, 1:58 PM

## 2016-03-29 ENCOUNTER — Ambulatory Visit: Payer: No Typology Code available for payment source | Admitting: Psychiatry

## 2016-04-05 ENCOUNTER — Encounter: Payer: Self-pay | Admitting: Psychiatry

## 2016-04-05 ENCOUNTER — Ambulatory Visit (INDEPENDENT_AMBULATORY_CARE_PROVIDER_SITE_OTHER): Payer: BLUE CROSS/BLUE SHIELD | Admitting: Psychiatry

## 2016-04-05 VITALS — BP 124/72 | HR 86 | Ht 72.0 in | Wt 207.4 lb

## 2016-04-05 DIAGNOSIS — F39 Unspecified mood [affective] disorder: Secondary | ICD-10-CM

## 2016-04-05 MED ORDER — ARMODAFINIL 200 MG PO TABS: 1.0000 | ORAL_TABLET | Freq: Every day | ORAL | 5 refills | Status: DC

## 2016-04-05 MED ORDER — ARIPIPRAZOLE 5 MG PO TABS: 5.0000 mg | ORAL_TABLET | Freq: Every day | ORAL | 1 refills | Status: DC

## 2016-04-05 MED ORDER — TRAZODONE HCL 150 MG PO TABS: 150.0000 mg | ORAL_TABLET | Freq: Every evening | ORAL | 1 refills | Status: DC | PRN

## 2016-04-05 MED ORDER — MIRTAZAPINE 30 MG PO TABS: 30.0000 mg | ORAL_TABLET | Freq: Every day | ORAL | 1 refills | Status: DC

## 2016-04-05 MED ORDER — LAMOTRIGINE 150 MG PO TABS: 150.0000 mg | ORAL_TABLET | Freq: Two times a day (BID) | ORAL | 2 refills | Status: DC

## 2016-04-05 NOTE — Progress Notes (Signed)
BH MD/PA/NP OP Progress Note  04/05/2016 1:17 PM Dustin Paul  MRN:  [id number]  Subjective:  Patient is a 55 year old male with history of mood disorder presented for follow-up.  He reported that he is feeling upset due to the recent breakup of the relationship. He reported that he was seeing lady for the past 3 months but he said and he decided to breakup the relationship. He reported that he was serious the relationship. He does not know the reason. She is sent him a message 2 weeks ago that she does not want to see him any longer. Patient reported that he cried for 2 days and has lost 5 pounds. He does not know what is going on with her. Patient was focused on the relationship issues during the interview. He reported that he is currently taking his medications and is compliant with them. He is spending time with his friends and is going for deer hunting.  He does not want to start therapy at this time. He appeared calm and alert during the interview. He sleeps well at night. He denied having any suicidal ideations or plans. He denies having any perceptual disturbances.       Chief Complaint:  Visit Diagnosis:     ICD-9-CM ICD-10-CM   1. Episodic mood disorder ([place]) 296.90 F39     Past Medical History:  Past Medical History:  Diagnosis Date  . Aneurysm of artery ([place]) 2011   LEFT (19 MM) AND RIGHT (11 MM) middle cerebral  . Chronic kidney disease    H/O STONES  . Complication of anesthesia   . Headache    MIGRAINES  . Hypertension   . Insomnia   . Lumbar stress fracture   . PONV (postoperative nausea and vomiting)    AFTER KNEE SURGERY IN 1980'S  . Seasonal allergies   . Seizures ([place])    last seizure in 2015  . Visual hallucinations     Past Surgical History:  Procedure Laterality Date  . BRAIN SURGERY  2011   2 ANEURYSMS-TOTAL OF 9 CLIPS IN BRAIN FROM ANEURYSM  . COLONOSCOPY WITH PROPOFOL N/A 01/16/2015   Procedure: COLONOSCOPY WITH PROPOFOL;  Surgeon: Hulen Luster, MD;  Location: [facility] ENDOSCOPY;  Service: Gastroenterology;  Laterality: N/A;  . KNEE ARTHROSCOPY WITH MEDIAL MENISECTOMY Left 11/20/2015   Procedure: KNEE ARTHROSCOPY WITH partial MEDIAL MENISECTOMY / WITH REMOVAL OF LOOSE BODY;  Surgeon: Hessie Knows, MD;  Location: [facility] ORS;  Service: Orthopedics;  Laterality: Left;  . KNEE SURGERY Left    Family History: No family history on file. Social History:  Social History   Social History  . Marital status: Widowed    Spouse name: N/A  . Number of children: N/A  . Years of education: N/A   Social History Main Topics  . Smoking status: Never Smoker  . Smokeless tobacco: Never Used  . Alcohol use No  . Drug use: No  . Sexual activity: Not Currently   Other Topics Concern  . None   Social History Narrative  . None   Additional History:   Assessment:   Musculoskeletal: Strength & Muscle Tone: within normal limits Gait & Station: normal Patient leans: N/A  Psychiatric Specialty Exam: Medication Refill     ROS  Blood pressure 124/72, pulse 86, height 6' (1.829 m), weight 207 lb 6.4 oz (94.1 kg).Body mass index is 28.13 kg/m.  General Appearance: Well Groomed  Eye Contact:  Good  Speech:  Normal Rate  Volume:  Normal  Mood:  Okay  Affect:  Congruent  Thought Process:  Linear and Logical  Orientation:  Full (Time, Place, and Person)  Thought Content:  Negative  Suicidal Thoughts:  No  Homicidal Thoughts:  No  Memory:  Immediate;   Good Recent;   Good Remote;   Good  Judgement:  Good  Insight:  Good  Psychomotor Activity:  Negative  Concentration:  Good  Recall:  Good  Fund of Knowledge: Good  Language: Good  Akathisia:  Negative  Handed:    AIMS (if indicated):  Done 05/15/2015 and normal   Assets:  Communication Skills Desire for Improvement Social Support  ADL's:  Intact  Cognition: Impaired,  Moderate  Sleep:  Poor, he relates over past week he may wake up between the hours of 3 and 7    Is the  patient at risk to self?  No. Has the patient been a risk to self in the past 6 months?  No. Has the patient been a risk to self within the distant past?  No. Is the patient a risk to others?  No. Has the patient been a risk to others in the past 6 months?  No. Has the patient been a risk to others within the distant past?  No.  Current Medications: Current Outpatient Prescriptions  Medication Sig Dispense Refill  . Alpha-Lipoic Acid 300 MG CAPS Take 300 mg by mouth every evening. Reported on 09/24/2015    . ARIPiprazole (ABILIFY) 5 MG tablet Take 1 tablet (5 mg total) by mouth daily. 30 tablet 1  . Armodafinil 200 MG TABS Take 1 tablet by mouth daily. 30 tablet 5  . celecoxib (CELEBREX) 200 MG capsule Take 1 capsule by mouth as needed.     . Chlorophyll-Alfalfa 20-100 MG TABS Take 340 mg by mouth Nightly. Reported on 05/15/2015    . cyclobenzaprine (FLEXERIL) 10 MG tablet Take 10 mg by mouth 3 (three) times daily as needed. Reported on 05/15/2015    . diclofenac (VOLTAREN) 50 MG EC tablet Take 50 mg by mouth as needed.     . lamoTRIgine (LAMICTAL) 150 MG tablet Take 1 tablet (150 mg total) by mouth 2 (two) times daily. 60 tablet 2  . lisinopril (PRINIVIL,ZESTRIL) 5 MG tablet Take 1 tablet (5 mg total) by mouth daily. (Patient taking differently: Take 5 mg by mouth every morning. ) 90 tablet 4  . Melatonin 10 MG CAPS Take 1 capsule by mouth at bedtime.    . mirtazapine (REMERON) 30 MG tablet Take 1 tablet (30 mg total) by mouth at bedtime. 30 tablet 1  . oxyCODONE-acetaminophen (PERCOCET) 7.5-325 MG tablet Take 1 tablet by mouth every 4 (four) hours as needed for severe pain. 35 tablet 0  . QUDEXY XR 150 MG CS24 take 1 capsule by mouth daily at bedtime  11  . rizatriptan (MAXALT) 5 MG tablet Take 5 mg by mouth as needed for migraine. May repeat in 2 hours if needed    . SUMAtriptan (IMITREX) 100 MG tablet Take 100 mg by mouth every 2 (two) hours as needed for migraine. May repeat in 2 hours if  headache persists or recurs.    . traMADol (ULTRAM) 50 MG tablet TAKE 1 TABLET BY MOUTH EVERY 4 TO 6 HOURS AS NEEDED FOR PAIN  0  . traZODone (DESYREL) 150 MG tablet Take 1 tablet (150 mg total) by mouth at bedtime as needed for sleep. 30 tablet 1   No current facility-administered medications for this  visit.     Medical Decision Making:  Established Problem, Stable/Improving (1), Review of Medication Regimen & Side Effects (2) and Review of New Medication or Change in Dosage (2)  Treatment Plan Summary:Medication management and Plan plan  Continue Abilify 5 mg by mouth daily. Continue mirtazapine 30 mg by mouth daily at bedtime Continue trazodone 150 mg by mouth daily at bedtime for insomnia He reported that he is taking half the dose of trazodone as well as of the Nuvigil.  Follow-up in 2 months or earlier depending on his symptoms   More than 50% of the time spent in psychoeducation, counseling and coordination of care.    This note was generated in part or whole with voice recognition software. Voice regonition is usually quite accurate but there are transcription errors that can and very often do occur. I apologize for any typographical errors that were not detected and corrected.   Rainey Pines, MD  04/05/2016, 1:17 PM

## 2016-06-08 ENCOUNTER — Ambulatory Visit: Payer: No Typology Code available for payment source | Admitting: Psychiatry

## 2016-06-25 ENCOUNTER — Other Ambulatory Visit: Payer: Self-pay | Admitting: Psychiatry

## 2016-07-11 ENCOUNTER — Emergency Department: Payer: Medicare Other

## 2016-07-11 ENCOUNTER — Encounter: Payer: Self-pay | Admitting: Emergency Medicine

## 2016-07-11 ENCOUNTER — Emergency Department
Admission: EM | Admit: 2016-07-11 | Discharge: 2016-07-11 | Disposition: A | Discharge status: Home / Self Care | Payer: Medicare Other | Attending: Emergency Medicine | Admitting: Emergency Medicine

## 2016-07-11 DIAGNOSIS — Y999 Unspecified external cause status: Secondary | ICD-10-CM | POA: Insufficient documentation

## 2016-07-11 DIAGNOSIS — I129 Hypertensive chronic kidney disease with stage 1 through stage 4 chronic kidney disease, or unspecified chronic kidney disease: Secondary | ICD-10-CM | POA: Insufficient documentation

## 2016-07-11 DIAGNOSIS — N189 Chronic kidney disease, unspecified: Secondary | ICD-10-CM | POA: Insufficient documentation

## 2016-07-11 DIAGNOSIS — W231XXA Caught, crushed, jammed, or pinched between stationary objects, initial encounter: Secondary | ICD-10-CM | POA: Diagnosis not present

## 2016-07-11 DIAGNOSIS — Y939 Activity, unspecified: Secondary | ICD-10-CM | POA: Insufficient documentation

## 2016-07-11 DIAGNOSIS — S60112A Contusion of left thumb with damage to nail, initial encounter: Secondary | ICD-10-CM | POA: Diagnosis not present

## 2016-07-11 DIAGNOSIS — S6992XA Unspecified injury of left wrist, hand and finger(s), initial encounter: Secondary | ICD-10-CM | POA: Diagnosis present

## 2016-07-11 DIAGNOSIS — M79645 Pain in left finger(s): Secondary | ICD-10-CM | POA: Diagnosis not present

## 2016-07-11 DIAGNOSIS — S60222A Contusion of left hand, initial encounter: Secondary | ICD-10-CM | POA: Diagnosis not present

## 2016-07-11 DIAGNOSIS — Y929 Unspecified place or not applicable: Secondary | ICD-10-CM | POA: Insufficient documentation

## 2016-07-11 DIAGNOSIS — S6010XA Contusion of unspecified finger with damage to nail, initial encounter: Secondary | ICD-10-CM

## 2016-07-11 MED ORDER — NAPROXEN 500 MG PO TBEC: 500.0000 mg | DELAYED_RELEASE_TABLET | Freq: Two times a day (BID) | ORAL | 0 refills | Status: AC

## 2016-07-11 MED ORDER — ACETAMINOPHEN 325 MG PO TABS
650.0000 mg | ORAL_TABLET | Freq: Once | ORAL | Status: AC
  Administered 2016-07-11: 650 mg via ORAL
  Filled 2016-07-11: qty 2

## 2016-07-11 NOTE — ED Triage Notes (Signed)
Patient comes into the ED via POV c/o left thumb injury from where he closed it in the car door.  Patient presents with bruising and swelling to the thumb.  Patient in NAD at this time .

## 2016-07-11 NOTE — ED Provider Notes (Signed)
[facility] Emergency Department Provider Note  ____________________________________________  Time seen: Approximately 3:43 PM  I have reviewed the triage vital signs and the nursing notes.   HISTORY  Chief Complaint Finger Injury    HPI Dustin Paul is a 56 y.o. male is presenting to the emergency department after pinching his thumb at the distal phalanx in a car door yesterday. He rates left thumb pain at 6/10 in intensity. Patient states that he experienced no pain at the time of injury. However, thumb has become progressively more painful with subungual hematoma formation Patient states that he has not been avoiding using the left hand. He denies prior traumas or chronic conditions affecting the left upper extremity. Patient has not attempted alleviating measures.   Past Medical History:  Diagnosis Date  . Aneurysm of artery ([place]) 2011   LEFT (19 MM) AND RIGHT (11 MM) middle cerebral  . Chronic kidney disease    H/O STONES  . Complication of anesthesia   . Headache    MIGRAINES  . Hypertension   . Insomnia   . Lumbar stress fracture   . PONV (postoperative nausea and vomiting)    AFTER KNEE SURGERY IN 1980'S  . Seasonal allergies   . Seizures ([place])    last seizure in 2015  . Visual hallucinations     Patient Active Problem List   Diagnosis Date Noted  . History of adenomatous polyp of colon 01/21/2015  . Chronic headache 12/05/2014  . Compression fracture of lumbar vertebra ([place]) 12/05/2014  . DD (diverticular disease) 12/05/2014  . Hallucination 12/05/2014  . Initial insomnia 12/05/2014  . Closed fracture of lumbar vertebra ([place]) 12/05/2014  . Diverticular disease of large intestine 12/05/2014  . Abdominal pain 11/22/2014  . Injury of kidney 08/18/2013  . Disturbance, visual, psychophysical 08/16/2013  . Episodic memory loss 06/07/2013  . Memory loss, short term 01/29/2013  . Blurred vision 06/21/2012  . Cephalalgia 06/01/2012   . Cannot sleep 06/01/2012  . Seizure ([place]) 10/21/2011  . Cardiac arrhythmia 03/12/2010  . Cardiac conduction disorder 03/12/2010  . Balloon like swelling of an artery of the brain 02/14/2010  . Calculus of kidney 10/22/2008  . Sleep arousal disorder 08/19/2008  . Family history of cancer of digestive system 03/27/2006  . Essential (primary) hypertension 03/23/2006    Past Surgical History:  Procedure Laterality Date  . BRAIN SURGERY  2011   2 ANEURYSMS-TOTAL OF 9 CLIPS IN BRAIN FROM ANEURYSM  . COLONOSCOPY WITH PROPOFOL N/A 01/16/2015   Procedure: COLONOSCOPY WITH PROPOFOL;  Surgeon: Hulen Luster, MD;  Location: [facility] ENDOSCOPY;  Service: Gastroenterology;  Laterality: N/A;  . KNEE ARTHROSCOPY WITH MEDIAL MENISECTOMY Left 11/20/2015   Procedure: KNEE ARTHROSCOPY WITH partial MEDIAL MENISECTOMY / WITH REMOVAL OF LOOSE BODY;  Surgeon: Hessie Knows, MD;  Location: [facility] ORS;  Service: Orthopedics;  Laterality: Left;  . KNEE SURGERY Left     Prior to Admission medications   Medication Sig Start Date End Date Taking? Authorizing Provider  Alpha-Lipoic Acid 300 MG CAPS Take 300 mg by mouth every evening. Reported on 09/24/2015    Historical Provider, MD  ARIPiprazole (ABILIFY) 5 MG tablet Take 1 tablet (5 mg total) by mouth daily. 04/05/16   Rainey Pines, MD  Armodafinil 200 MG TABS Take 1 tablet by mouth daily. 04/05/16   Rainey Pines, MD  celecoxib (CELEBREX) 200 MG capsule Take 1 capsule by mouth as needed.     Historical Provider, MD  Chlorophyll-Alfalfa 20-100 MG TABS  Take 340 mg by mouth Nightly. Reported on 05/15/2015    Historical Provider, MD  cyclobenzaprine (FLEXERIL) 10 MG tablet Take 10 mg by mouth 3 (three) times daily as needed. Reported on 05/15/2015    Historical Provider, MD  diclofenac (VOLTAREN) 50 MG EC tablet Take 50 mg by mouth as needed.  08/01/15   Historical Provider, MD  lamoTRIgine (LAMICTAL) 150 MG tablet Take 1 tablet (150 mg total) by mouth 2 (two) times daily. 04/05/16    Rainey Pines, MD  lisinopril (PRINIVIL,ZESTRIL) 5 MG tablet Take 1 tablet (5 mg total) by mouth daily. Patient taking differently: Take 5 mg by mouth every morning.  10/23/15   Birdie Sons, MD  Melatonin 10 MG CAPS Take 1 capsule by mouth at bedtime. 08/19/08   Historical Provider, MD  mirtazapine (REMERON) 30 MG tablet Take 1 tablet (30 mg total) by mouth at bedtime. 04/05/16   Rainey Pines, MD  oxyCODONE-acetaminophen (PERCOCET) 7.5-325 MG tablet Take 1 tablet by mouth every 4 (four) hours as needed for severe pain. 11/20/15   Hessie Knows, MD  QUDEXY XR 150 MG CS24 take 1 capsule by mouth daily at bedtime 06/19/15   Historical Provider, MD  rizatriptan (MAXALT) 5 MG tablet Take 5 mg by mouth as needed for migraine. May repeat in 2 hours if needed    Historical Provider, MD  SUMAtriptan (IMITREX) 100 MG tablet Take 100 mg by mouth every 2 (two) hours as needed for migraine. May repeat in 2 hours if headache persists or recurs.    Historical Provider, MD  traMADol (ULTRAM) 50 MG tablet TAKE 1 TABLET BY MOUTH EVERY 4 TO 6 HOURS AS NEEDED FOR PAIN 08/26/15   Historical Provider, MD  traZODone (DESYREL) 150 MG tablet TAKE 1 TABLET (150 MG TOTAL) BY MOUTH AT BEDTIME AS NEEDED FOR SLEEP. 06/25/16   Rainey Pines, MD    Allergies Aspirin and Levetiracetam  No family history on file.  Social History Social History  Substance Use Topics  . Smoking status: Never Smoker  . Smokeless tobacco: Never Used  . Alcohol use No     Review of Systems  Constitutional: No fever/chills Eyes: No visual changes. No discharge ENT: No upper respiratory complaints. Cardiovascular: no chest pain. Respiratory: no cough. No SOB. Gastrointestinal: No abdominal pain.  No nausea, no vomiting.  No diarrhea.  No constipation. Genitourinary: Negative for dysuria. No hematuria Musculoskeletal:Patient has left thumb pain and subungual hematoma.  Skin: No abrasions or lacerations Neurological: Negative for headaches, focal  weakness or numbness. ____________________________________________   PHYSICAL EXAM:  VITAL SIGNS: ED Triage Vitals  Enc Vitals Group     BP 07/11/16 1325 (!) 151/88     Pulse Rate 07/11/16 1325 80     Resp 07/11/16 1325 18     Temp 07/11/16 1325 98.3 F (36.8 C)     Temp Source 07/11/16 1325 Oral     SpO2 07/11/16 1325 95 %     Weight 07/11/16 1326 205 lb (93 kg)     Height 07/11/16 1326 6' (1.829 m)     Head Circumference --      Peak Flow --      Pain Score 07/11/16 1326 8     Pain Loc --      Pain Edu? --      Excl. in [place]? --      Constitutional: Alert and oriented. Well appearing and in no acute distress. Eyes: Conjunctivae are normal. PERRL. EOMI. Cardiovascular: Normal rate,  regular rhythm. Normal S1 and S2.  Good peripheral circulation. Respiratory: Normal respiratory effort without tachypnea or retractions. Lungs CTAB. Good air entry to the bases with no decreased or absent breath sounds. Musculoskeletal: To inspection, first digits of the upper extremities appear symmetrical. Patient has full flexion and extension at the left wrist. He is able to perform flexion, extension and opposition at the left thumb. Palpable radial and ulnar pulses bilaterally and symmetrically. Neurologic:  Normal speech and language. No gross focal neurologic deficits are appreciated. Reflexes are 2+ and symmetric in the upper extremities bilaterally. Skin: Skin overlying left thumb is without bruising, lacerations or abrasions. Patient has a left first digit subungual hematoma Psychiatric: Mood and affect are normal. Speech and behavior are normal. Patient exhibits appropriate insight and judgement. ____________________________________________   LABS (all labs ordered are listed, but only abnormal results are displayed)  Labs Reviewed - No data to display ____________________________________________  EKG   ____________________________________________  RADIOLOGY Unk Pinto,  personally viewed and evaluated these images (plain radiographs) as part of my medical decision making, as well as reviewing the written report by the radiologist.  Dg Hand Complete Left  Result Date: 07/11/2016 CLINICAL DATA:  Car door trauma to left first digit. EXAM: LEFT HAND - COMPLETE 3+ VIEW COMPARISON:  None. FINDINGS: There is no evidence of fracture or dislocation. There is no evidence of arthropathy or other focal bone abnormality. Soft tissues are unremarkable. IMPRESSION: Negative. Electronically Signed   By: Dorise Bullion III M.D   On: 07/11/2016 15:09    ____________________________________________    PROCEDURES  Procedure(s) performed:    Procedures  Trephination  Performed by: Lannie Fields Authorized by: Lannie Fields Consent: Verbal consent obtained. Risks and benefits: risks, benefits and alternatives were discussed Consent given by: patient Patient identity confirmed: provided demographic data  Location: Left thumb  Irrigation method: syringe Amount of cleaning: standard  Patient tolerance: Patient tolerated the procedure well with no immediate complications.    Medications - No data to display   ____________________________________________   INITIAL IMPRESSION / ASSESSMENT AND PLAN / ED COURSE  Pertinent labs & imaging results that were available during my care of the patient were reviewed by me and considered in my medical decision making (see chart for details).  Review of the [place] CSRS was performed in accordance of the [place] prior to dispensing any controlled drugs.     Assessment and Plan:  Left Thumb Pain Subungual hematoma Patient presents to the emergency department with left thumb pain and a subungual hematoma of the first digit (upper extremity). DG left hand reveals no fractures or acute bony abnormalities. Trephination was performed in the emergency department. Patient tolerated the procedure  well. ___________________________________________  FINAL CLINICAL IMPRESSION(S) / ED DIAGNOSES  Final diagnoses:  Subungual hematoma of digit of hand, initial encounter  Pain of left thumb      NEW MEDICATIONS STARTED DURING THIS VISIT:  New Prescriptions   No medications on file        This chart was dictated using voice recognition software/Dragon. Despite best efforts to proofread, errors can occur which can change the meaning. Any change was purely unintentional.    Lannie Fields, PA-C 07/11/16 [place], MD 07/11/16 [phone]

## 2016-07-11 NOTE — ED Notes (Signed)
NAD noted at time of D/C. Pt denies questions or concerns. Pt ambulatory to the lobby at this time.  

## 2016-07-12 ENCOUNTER — Encounter: Payer: Self-pay | Admitting: Psychiatry

## 2016-07-12 ENCOUNTER — Ambulatory Visit (INDEPENDENT_AMBULATORY_CARE_PROVIDER_SITE_OTHER): Payer: Medicare Other | Admitting: Psychiatry

## 2016-07-12 VITALS — BP 137/80 | HR 81 | Temp 99.6°F | Wt 207.8 lb

## 2016-07-12 DIAGNOSIS — F39 Unspecified mood [affective] disorder: Secondary | ICD-10-CM | POA: Diagnosis not present

## 2016-07-12 DIAGNOSIS — F5105 Insomnia due to other mental disorder: Secondary | ICD-10-CM

## 2016-07-12 MED ORDER — MIRTAZAPINE 30 MG PO TABS: 30.0000 mg | ORAL_TABLET | Freq: Every day | ORAL | 1 refills | Status: DC

## 2016-07-12 MED ORDER — ARIPIPRAZOLE 5 MG PO TABS: 5.0000 mg | ORAL_TABLET | Freq: Every day | ORAL | 1 refills | Status: DC

## 2016-07-12 MED ORDER — TRAZODONE HCL 150 MG PO TABS: 150.0000 mg | ORAL_TABLET | Freq: Every evening | ORAL | 1 refills | Status: DC | PRN

## 2016-07-12 NOTE — Progress Notes (Signed)
BH MD/PA/NP OP Progress Note  07/12/2016 1:31 PM Dustin Paul  MRN:  [id number]  Subjective:  Patient is a 56 year old male with history of mood disorder presented for follow-up.  He reported that he is doing well and has started seeing another girl friend for the past 2 months. He reported that he found her on the Internet website. He is excited about a new relationship. He reported that he is going to surprise her on the Valentine's Day. Patient stated that he has been compliant with the medications. He feels sleepy for couple of hours after he takes Abilify in the morning. We discussed about changing the times of the medications. He agreed with the plan. Patient reported that he has been sleeping well at night and has been taking trazodone and melatonin. He reported that he has not had any seizures or hallucinations since 2015. He takes lamotrigine for the same. Patient reported that he has been taking Armodafinil for being more alert but he has been taking it only on a when necessary basis. He is more active and alert on the current combination the medications. He has started exercising on a regular basis resulting in weight loss.  Patient currently denied having any suicidal ideations or plans. He denied having any perceptual disturbances.    Chief Complaint: Chief Complaint    Follow-up; Medication Refill     Visit Diagnosis:     ICD-9-CM ICD-10-CM   1. Episodic mood disorder ([place]) 296.90 F39   2. Insomnia due to mental condition 300.9 F51.05    327.02      Past Medical History:  Past Medical History:  Diagnosis Date  . 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 Chronic kidney disease    H/O STONES  . Complication of anesthesia   . Headache    MIGRAINES  . Hypertension   . Insomnia   . Lumbar stress fracture   . PONV (postoperative nausea and vomiting)    AFTER KNEE SURGERY IN 1980'S  . Seasonal allergies   . Seizures ([place])    last seizure in 2015  . Visual hallucinations     Past Surgical History:  Procedure Laterality Date  . BRAIN SURGERY  2011   2 ANEURYSMS-TOTAL OF 9 CLIPS IN BRAIN FROM ANEURYSM  . COLONOSCOPY WITH PROPOFOL N/A 01/16/2015   Procedure: COLONOSCOPY WITH PROPOFOL;  Surgeon: Hulen Luster, MD;  Location: [facility] ENDOSCOPY;  Service: Gastroenterology;  Laterality: N/A;  . KNEE ARTHROSCOPY WITH MEDIAL MENISECTOMY Left 11/20/2015   Procedure: KNEE ARTHROSCOPY WITH partial MEDIAL MENISECTOMY / WITH REMOVAL OF LOOSE BODY;  Surgeon: Hessie Knows, MD;  Location: [facility] ORS;  Service: Orthopedics;  Laterality: Left;  . KNEE SURGERY Left    Family History: History reviewed. No pertinent family history. Social History:  Social History   Social History  . Marital status: Widowed    Spouse name: N/A  . Number of children: N/A  . Years of education: N/A   Social History Main Topics  . Smoking status: Never Smoker  . Smokeless tobacco: Never Used  . Alcohol use No  . Drug use: No  . Sexual activity: Not Currently   Other Topics Concern  . None   Social History Narrative  . None   Additional History:   Assessment:   Musculoskeletal: Strength & Muscle Tone: within normal limits Gait & Station: normal Patient leans: N/A  Psychiatric Specialty Exam: Medication Refill  ROS  Blood pressure 137/80, pulse 81, temperature 99.6 F (37.6 C), temperature source Oral, weight 207 lb 12.8 oz (94.3 kg).Body mass index is 28.18 kg/m.  General Appearance: Well Groomed  Eye Contact:  Good  Speech:  Normal Rate  Volume:  Normal  Mood:  Okay  Affect:  Congruent  Thought Process:  Linear and Logical  Orientation:  Full (Time, Place, and Person)  Thought Content:  Negative  Suicidal Thoughts:  No  Homicidal Thoughts:  No  Memory:  Immediate;   Good Recent;   Good Remote;   Good  Judgement:  Good  Insight:  Good  Psychomotor Activity:  Negative  Concentration:  Good  Recall:  Good  Fund of  Knowledge: Good  Language: Good  Akathisia:  Negative  Handed:    AIMS (if indicated):  Done 05/15/2015 and normal   Assets:  Communication Skills Desire for Improvement Social Support  ADL's:  Intact  Cognition: Impaired,  Moderate  Sleep:  Poor, he relates over past week he may wake up between the hours of 3 and 7    Is the patient at risk to self?  No. Has the patient been a risk to self in the past 6 months?  No. Has the patient been a risk to self within the distant past?  No. Is the patient a risk to others?  No. Has the patient been a risk to others in the past 6 months?  No. Has the patient been a risk to others within the distant past?  No.  Current Medications: Current Outpatient Prescriptions  Medication Sig Dispense Refill  . Alpha-Lipoic Acid 300 MG CAPS Take 300 mg by mouth every evening. Reported on 09/24/2015    . ARIPiprazole (ABILIFY) 5 MG tablet Take 1 tablet (5 mg total) by mouth daily. 30 tablet 1  . Armodafinil 200 MG TABS Take 1 tablet by mouth daily. 30 tablet 5  . celecoxib (CELEBREX) 200 MG capsule Take 1 capsule by mouth as needed.     . Chlorophyll-Alfalfa 20-100 MG TABS Take 340 mg by mouth Nightly. Reported on 05/15/2015    . cyclobenzaprine (FLEXERIL) 10 MG tablet Take 10 mg by mouth 3 (three) times daily as needed. Reported on 05/15/2015    . diclofenac (VOLTAREN) 50 MG EC tablet Take 50 mg by mouth as needed.     . lamoTRIgine (LAMICTAL) 150 MG tablet Take 1 tablet (150 mg total) by mouth 2 (two) times daily. 60 tablet 2  . lisinopril (PRINIVIL,ZESTRIL) 5 MG tablet Take 1 tablet (5 mg total) by mouth daily. (Patient taking differently: Take 5 mg by mouth every morning. ) 90 tablet 4  . Melatonin 10 MG CAPS Take 1 capsule by mouth at bedtime.    . mirtazapine (REMERON) 30 MG tablet Take 1 tablet (30 mg total) by mouth at bedtime. 30 tablet 1  . naproxen (EC NAPROSYN) 500 MG EC tablet Take 1 tablet (500 mg total) by mouth 2 (two) times daily with a meal.  20 tablet 0  . oxyCODONE-acetaminophen (PERCOCET) 7.5-325 MG tablet Take 1 tablet by mouth every 4 (four) hours as needed for severe pain. 35 tablet 0  . QUDEXY XR 150 MG CS24 take 1 capsule by mouth daily at bedtime  11  . rizatriptan (MAXALT) 5 MG tablet Take 5 mg by mouth as needed for migraine. May repeat in 2 hours if needed    . SUMAtriptan (IMITREX) 100 MG tablet Take 100 mg by mouth every 2 (two)  hours as needed for migraine. May repeat in 2 hours if headache persists or recurs.    . traMADol (ULTRAM) 50 MG tablet TAKE 1 TABLET BY MOUTH EVERY 4 TO 6 HOURS AS NEEDED FOR PAIN  0  . traZODone (DESYREL) 150 MG tablet Take 1 tablet (150 mg total) by mouth at bedtime as needed for sleep. 30 tablet 1   No current facility-administered medications for this visit.     Medical Decision Making:  Established Problem, Stable/Improving (1), Review of Medication Regimen & Side Effects (2) and Review of New Medication or Change in Dosage (2)  Treatment Plan Summary:Medication management and Plan plan  Continue Abilify 5 mg by mouth daily. Continue mirtazapine 30 mg by mouth daily at bedtime Continue trazodone 150 mg by mouth daily at bedtime for insomnia He reported that he is taking half the dose of trazodone as well as of the Nuvigil.  Follow-up in 2 months or earlier depending on his symptoms   More than 50% of the time spent in psychoeducation, counseling and coordination of care.    This note was generated in part or whole with voice recognition software. Voice regonition is usually quite accurate but there are transcription errors that can and very often do occur. I apologize for any typographical errors that were not detected and corrected.   Rainey Pines, MD  07/12/2016, 1:31 PM

## 2016-07-13 ENCOUNTER — Encounter: Payer: Self-pay | Admitting: Family Medicine

## 2016-07-13 ENCOUNTER — Ambulatory Visit (INDEPENDENT_AMBULATORY_CARE_PROVIDER_SITE_OTHER): Payer: Medicare Other | Admitting: Family Medicine

## 2016-07-13 VITALS — BP 118/76 | HR 77 | Temp 99.4°F | Resp 16 | Wt 209.0 lb

## 2016-07-13 DIAGNOSIS — S6010XA Contusion of unspecified finger with damage to nail, initial encounter: Secondary | ICD-10-CM

## 2016-07-13 NOTE — Patient Instructions (Signed)
Hematoma A hematoma is a collection of blood. The collection of blood can turn into a hard, painful lump under the skin. Your skin may turn blue or yellow if the hematoma is close to the surface of the skin. Most hematomas get better in a few days to weeks. Some hematomas are serious and need medical care. Hematomas can be very small or very big. Follow these instructions at home:  Apply ice to the injured area: ? Put ice in a plastic bag. ? Place a towel between your skin and the bag. ? Leave the ice on for 20 minutes, 2-3 times a day for the first 1 to 2 days.  After the first 2 days, switch to using warm packs on the injured area.  Raise (elevate) the injured area to lessen pain and puffiness (swelling). You may also wrap the area with an elastic bandage. Make sure the bandage is not wrapped too tight.  If you have a painful hematoma on your leg or foot, you may use crutches for a couple days.  Only take medicines as told by your doctor. Get help right away if:  Your pain gets worse.  Your pain is not controlled with medicine.  You have a fever.  Your puffiness gets worse.  Your skin turns more blue or yellow.  Your skin over the hematoma breaks or starts bleeding.  Your hematoma is in your chest or belly (abdomen) and you are short of breath, feel weak, or have a change in consciousness.  Your hematoma is on your scalp and you have a headache that gets worse or a change in alertness or consciousness. This information is not intended to replace advice given to you by your health care provider. Make sure you discuss any questions you have with your health care provider. Document Released: 06/24/2004 Document Revised: 10/23/2015 Document Reviewed: 10/25/2012 Elsevier Interactive Patient Education  2017 Elsevier Inc.  

## 2016-07-13 NOTE — Progress Notes (Signed)
Patient: Dustin Paul Male    DOB: 05/03/1961   56 y.o.   MRN: [id number] Visit Date: 07/13/2016  Today's Provider: Lelon Huh, MD   Chief Complaint  Patient presents with  . Hand Injury   Subjective:    HPI  Follow up ER visit  Patient was seen in ER on 07/11/2016  for left thumb injury that occurred  after slamming it into a car door.on 07-08-2016 He was treated for Subungual hematoma Treatment for this included performing Trephination. He reports good compliance with treatment. He reports this condition is Improved.. States he continues to have bleeding from nail and is having to change bandage every 1-2 hours. He states he has always been easy bleeder and cannot take aspirin. However he has been taking ibuprofen several times day.  ------------------------------------------------------------------------------------      Allergies  Allergen Reactions  . Aspirin     Nose bleed, thin blood  . Levetiracetam     Racing thoughts per [facility] neurology note 03-11-11     Current Outpatient Prescriptions:  .  Alpha-Lipoic Acid 300 MG CAPS, Take 300 mg by mouth every evening. Reported on 09/24/2015, Disp: , Rfl:  .  ARIPiprazole (ABILIFY) 5 MG tablet, Take 1 tablet (5 mg total) by mouth daily., Disp: 30 tablet, Rfl: 1 .  Armodafinil 200 MG TABS, Take 1 tablet by mouth daily., Disp: 30 tablet, Rfl: 5 .  celecoxib (CELEBREX) 200 MG capsule, Take 1 capsule by mouth as needed. , Disp: , Rfl:  .  Chlorophyll-Alfalfa 20-100 MG TABS, Take 340 mg by mouth Nightly. Reported on 05/15/2015, Disp: , Rfl:  .  cyclobenzaprine (FLEXERIL) 10 MG tablet, Take 10 mg by mouth 3 (three) times daily as needed. Reported on 05/15/2015, Disp: , Rfl:  .  diclofenac (VOLTAREN) 50 MG EC tablet, Take 50 mg by mouth as needed. , Disp: , Rfl:  .  lamoTRIgine (LAMICTAL) 150 MG tablet, Take 1 tablet (150 mg total) by mouth 2 (two) times daily., Disp: 60 tablet, Rfl: 2 .  lisinopril (PRINIVIL,ZESTRIL)  5 MG tablet, Take 1 tablet (5 mg total) by mouth daily. (Patient taking differently: Take 5 mg by mouth every morning. ), Disp: 90 tablet, Rfl: 4 .  Melatonin 10 MG CAPS, Take 1 capsule by mouth at bedtime., Disp: , Rfl:  .  mirtazapine (REMERON) 30 MG tablet, Take 1 tablet (30 mg total) by mouth at bedtime., Disp: 30 tablet, Rfl: 1 .  naproxen (EC NAPROSYN) 500 MG EC tablet, Take 1 tablet (500 mg total) by mouth 2 (two) times daily with a meal., Disp: 20 tablet, Rfl: 0 .  oxyCODONE-acetaminophen (PERCOCET) 7.5-325 MG tablet, Take 1 tablet by mouth every 4 (four) hours as needed for severe pain., Disp: 35 tablet, Rfl: 0 .  QUDEXY XR 150 MG CS24, take 1 capsule by mouth daily at bedtime, Disp: , Rfl: 11 .  rizatriptan (MAXALT) 5 MG tablet, Take 5 mg by mouth as needed for migraine. May repeat in 2 hours if needed, Disp: , Rfl:  .  SUMAtriptan (IMITREX) 100 MG tablet, Take 100 mg by mouth every 2 (two) hours as needed for migraine. May repeat in 2 hours if headache persists or recurs., Disp: , Rfl:  .  traMADol (ULTRAM) 50 MG tablet, TAKE 1 TABLET BY MOUTH EVERY 4 TO 6 HOURS AS NEEDED FOR PAIN, Disp: , Rfl: 0 .  traZODone (DESYREL) 150 MG tablet, Take 1 tablet (150 mg total) by mouth at  bedtime as needed for sleep., Disp: 30 tablet, Rfl: 1  Review of Systems  Constitutional: Negative for appetite change, chills and fever.  Respiratory: Negative for chest tightness, shortness of breath and wheezing.   Cardiovascular: Negative for chest pain and palpitations.  Gastrointestinal: Negative for abdominal pain, nausea and vomiting.  Hematological:       Excessive bleeding of left thumb injury    Social History  Substance Use Topics  . Smoking status: Never Smoker  . Smokeless tobacco: Never Used  . Alcohol use No   Objective:   BP 118/76 (BP Location: Left Arm, Patient Position: Sitting, Cuff Size: Large)   Pulse 77   Temp 99.4 F (37.4 C) (Oral)   Resp 16   Wt 209 lb (94.8 kg)   SpO2 97%  Comment: room air  BMI 28.35 kg/m   Physical Exam   Derm: small pore in nail of left first digit with is nearly entirely underscored by hematoma. Very slow oozing of dark blood.     Assessment & Plan:     1. Subungual hematoma of digit of hand, initial encounter Advised not to take any NSAIDS, but that acetaminophen or tylenol is OK. Recommend frequent application of ice to nail, for about 8 minutes every 1-2 hours.        Lelon Huh, MD  [facility]

## 2016-07-26 ENCOUNTER — Telehealth: Payer: Self-pay

## 2016-07-26 NOTE — Telephone Encounter (Signed)
Pt advised and agrees with treatment plan. Emily Drozdowski, CMA  

## 2016-07-26 NOTE — Telephone Encounter (Signed)
Pt called concerned because his thumb nail is detaching from the nail bed after an injury. Pt is asking how to clean and care for this type of wound. Please advise. Renaldo Fiddler, CMA

## 2016-07-26 NOTE — Telephone Encounter (Signed)
Soak in a cup of warm saline (about a 1/2 tsp salt in a cup of water) for five minutes twice a day. Pat dry. Apply adhesive bandage over nail bed.

## 2016-07-30 ENCOUNTER — Telehealth: Payer: Self-pay | Admitting: Family Medicine

## 2016-07-30 NOTE — Telephone Encounter (Signed)
Called Pt to schedule AWV with [facility] - knb °

## 2016-08-10 ENCOUNTER — Ambulatory Visit (INDEPENDENT_AMBULATORY_CARE_PROVIDER_SITE_OTHER): Payer: Medicare Other | Admitting: Family Medicine

## 2016-08-10 ENCOUNTER — Encounter: Payer: Self-pay | Admitting: Family Medicine

## 2016-08-10 VITALS — BP 104/60 | HR 92 | Temp 99.1°F | Resp 16 | Ht 72.0 in | Wt 209.0 lb

## 2016-08-10 DIAGNOSIS — R569 Unspecified convulsions: Secondary | ICD-10-CM

## 2016-08-10 DIAGNOSIS — I10 Essential (primary) hypertension: Secondary | ICD-10-CM

## 2016-08-10 DIAGNOSIS — Z0000 Encounter for general adult medical examination without abnormal findings: Secondary | ICD-10-CM

## 2016-08-10 DIAGNOSIS — Z125 Encounter for screening for malignant neoplasm of prostate: Secondary | ICD-10-CM | POA: Diagnosis not present

## 2016-08-10 NOTE — Progress Notes (Signed)
Patient: Dustin Paul, Male    DOB: 1960-12-07, 55 y.o.   MRN: [id number] Visit Date: 08/10/2016  Today's Provider: Lelon Huh, MD   Chief Complaint  Patient presents with  . Annual Exam  . Hypertension   Subjective:    Annual physical exam Dustin Paul is a 56 y.o. male who presents today for health maintenance and complete physical. He feels well. He reports exercising yes/walking and biking, gym. He reports he is sleeping well.  ----------------------------------------------------------------     Hypertension, follow-up:  BP Readings from Last 3 Encounters:  08/10/16 104/60  07/13/16 118/76  07/11/16 119/85    He was last seen for hypertension 10/23/2013.  BP at that visit was n/a. Management since that visit includes; labs checked, no changes.He reports good compliance with treatment. He is not having side effects. none He is exercising. He is adherent to low salt diet.   Outside blood pressures are normal. He is experiencing none.  Patient denies none.   Cardiovascular risk factors include none.  Use of agents associated with hypertension: none.   ----------------------------------------------------------------    Review of Systems  Constitutional: Negative for chills, diaphoresis and fever.  HENT: Negative for congestion, ear discharge, ear pain, hearing loss, nosebleeds, sore throat and tinnitus.   Eyes: Negative for photophobia, pain, discharge and redness.  Respiratory: Negative for cough, shortness of breath, wheezing and stridor.   Cardiovascular: Negative for chest pain, palpitations and leg swelling.  Gastrointestinal: Negative for abdominal pain, blood in stool, constipation, diarrhea, nausea and vomiting.  Endocrine: Negative for polydipsia.  Genitourinary: Negative for dysuria, flank pain, frequency, hematuria and urgency.  Musculoskeletal: Negative for back pain, myalgias and neck pain.  Skin: Negative for rash.    Allergic/Immunologic: Negative for environmental allergies.  Neurological: Positive for headaches. Negative for dizziness, tremors, seizures and weakness.  Hematological: Does not bruise/bleed easily.  Psychiatric/Behavioral: Negative for hallucinations and suicidal ideas. The patient is not nervous/anxious.   All other systems reviewed and are negative.   Social History      He  reports that he has never smoked. He has never used smokeless tobacco. He reports that he does not drink alcohol or use drugs.       Social History   Social History  . Marital status: Widowed    Spouse name: N/A  . Number of children: N/A  . Years of education: N/A   Social History Main Topics  . Smoking status: Never Smoker  . Smokeless tobacco: Never Used  . Alcohol use No  . Drug use: No  . Sexual activity: Not Currently   Other Topics Concern  . None   Social History Narrative  . None    Past Medical History:  Diagnosis Date  . Aneurysm of artery ([place]) 2011   LEFT (19 MM) AND RIGHT (11 MM) middle cerebral  . Chronic kidney disease    H/O STONES  . Complication of anesthesia   . Headache    MIGRAINES  . Hypertension   . Insomnia   . Lumbar stress fracture   . PONV (postoperative nausea and vomiting)    AFTER KNEE SURGERY IN 1980'S  . Seasonal allergies   . Seizures ([place])    last seizure in 2015  . Visual hallucinations      Patient Active Problem List   Diagnosis Date Noted  . History of adenomatous polyp of colon 01/21/2015  . Chronic headache 12/05/2014  . Compression fracture of lumbar vertebra (  [place]) 12/05/2014  . DD (diverticular disease) 12/05/2014  . Hallucination 12/05/2014  . Initial insomnia 12/05/2014  . Closed fracture of lumbar vertebra ([place]) 12/05/2014  . Diverticular disease of large intestine 12/05/2014  . Abdominal pain 11/22/2014  . Injury of kidney 08/18/2013  . Disturbance, visual, psychophysical 08/16/2013  . Episodic memory loss 06/07/2013  . Memory  loss, short term 01/29/2013  . Blurred vision 06/21/2012  . Cephalalgia 06/01/2012  . Cannot sleep 06/01/2012  . Seizure ([place]) 10/21/2011  . Cardiac arrhythmia 03/12/2010  . Cardiac conduction disorder 03/12/2010  . Balloon like swelling of an artery of the brain 02/14/2010  . Calculus of kidney 10/22/2008  . Sleep arousal disorder 08/19/2008  . Family history of cancer of digestive system 03/27/2006  . Essential (primary) hypertension 03/23/2006    Past Surgical History:  Procedure Laterality Date  . BRAIN SURGERY  2011   2 ANEURYSMS-TOTAL OF 9 CLIPS IN BRAIN FROM ANEURYSM  . COLONOSCOPY WITH PROPOFOL N/A 01/16/2015   Procedure: COLONOSCOPY WITH PROPOFOL;  Surgeon: Hulen Luster, MD;  Location: [facility] ENDOSCOPY;  Service: Gastroenterology;  Laterality: N/A;  . KNEE ARTHROSCOPY WITH MEDIAL MENISECTOMY Left 11/20/2015   Procedure: KNEE ARTHROSCOPY WITH partial MEDIAL MENISECTOMY / WITH REMOVAL OF LOOSE BODY;  Surgeon: Hessie Knows, MD;  Location: [facility] ORS;  Service: Orthopedics;  Laterality: Left;  . KNEE SURGERY Left     Family History        Family Status  Relation Status  . Mother Alive  . Sister Deceased at age 66   MVA  . Father Alive        His family history is not on file.     Allergies  Allergen Reactions  . Aspirin     Nose bleed, thin blood  . Levetiracetam     Racing thoughts per [facility] neurology note 03-11-11     Current Outpatient Prescriptions:  .  Alpha-Lipoic Acid 300 MG CAPS, Take 300 mg by mouth every evening. Reported on 09/24/2015, Disp: , Rfl:  .  ARIPiprazole (ABILIFY) 5 MG tablet, Take 1 tablet (5 mg total) by mouth daily., Disp: 30 tablet, Rfl: 1 .  Armodafinil 200 MG TABS, Take 1 tablet by mouth daily., Disp: 30 tablet, Rfl: 5 .  cyclobenzaprine (FLEXERIL) 10 MG tablet, Take 10 mg by mouth 3 (three) times daily as needed. Reported on 05/15/2015, Disp: , Rfl:  .  diclofenac (VOLTAREN) 50 MG EC tablet, Take 50 mg by mouth as needed. , Disp: , Rfl:  .   lamoTRIgine (LAMICTAL) 150 MG tablet, Take 1 tablet (150 mg total) by mouth 2 (two) times daily., Disp: 60 tablet, Rfl: 2 .  lisinopril (PRINIVIL,ZESTRIL) 5 MG tablet, Take 1 tablet (5 mg total) by mouth daily. (Patient taking differently: Take 5 mg by mouth every morning. ), Disp: 90 tablet, Rfl: 4 .  Melatonin 10 MG CAPS, Take 1 capsule by mouth at bedtime., Disp: , Rfl:  .  mirtazapine (REMERON) 30 MG tablet, Take 1 tablet (30 mg total) by mouth at bedtime., Disp: 30 tablet, Rfl: 1 .  QUDEXY XR 150 MG CS24, take 1 capsule by mouth daily at bedtime, Disp: , Rfl: 11 .  rizatriptan (MAXALT) 5 MG tablet, Take 5 mg by mouth as needed for migraine. May repeat in 2 hours if needed, Disp: , Rfl:  .  TROKENDI XR 50 MG CP24, Take 3 capsules by mouth at bedtime., Disp: , Rfl: 8   Patient Care Team: Birdie Sons, MD as  PCP - General (Family Medicine)      Objective:   Vitals: BP 104/60 (BP Location: Right Arm, Patient Position: Sitting, Cuff Size: Large)   Pulse 92   Temp 99.1 F (37.3 C) (Oral)   Resp 16   Ht 6' (1.829 m)   Wt 209 lb (94.8 kg)   SpO2 96%   BMI 28.35 kg/m    Vitals:   08/10/16 1405  BP: 104/60  Pulse: 92  Resp: 16  Temp: 99.1 F (37.3 C)  TempSrc: Oral  SpO2: 96%  Weight: 209 lb (94.8 kg)  Height: 6' (1.829 m)     Physical Exam   General Appearance:    Alert, cooperative, no distress, appears stated age  Head:    Normocephalic, without obvious abnormality, atraumatic  Eyes:    PERRL, conjunctiva/corneas clear, EOM's intact, fundi    benign, both eyes       Ears:    Normal TM's and external ear canals, both ears  Nose:   Nares normal, septum midline, mucosa normal, no drainage   or sinus tenderness  Throat:   Lips, mucosa, and tongue normal; teeth and gums normal  Neck:   Supple, symmetrical, trachea midline, no adenopathy;       thyroid:  No enlargement/tenderness/nodules; no carotid   bruit or JVD  Back:     Symmetric, no curvature, ROM normal, no CVA  tenderness  Lungs:     Clear to auscultation bilaterally, respirations unlabored  Chest wall:    No tenderness or deformity  Heart:    Regular rate and rhythm, S1 and S2 normal, no murmur, rub   or gallop  Abdomen:     Soft, non-tender, bowel sounds active all four quadrants,    no masses, no organomegaly  Genitalia:    deferred  Rectal:    deferred  Extremities:   Extremities normal, atraumatic, no cyanosis or edema  Pulses:   2+ and symmetric all extremities  Skin:   Skin color, texture, turgor normal, no rashes or lesions  Lymph nodes:   Cervical, supraclavicular, and axillary nodes normal  Neurologic:   CNII-XII intact. Normal strength, sensation and reflexes      throughout    Depression Screen PHQ 2/9 Scores 08/10/2016  PHQ - 2 Score 0  PHQ- 9 Score 2   Audit-C Alcohol Use Screening  Question Answer Points  How often do you have alcoholic drink? 1 or more times monthly 1  On days you do drink alcohol, how many drinks do you typically consume? 1 or more 1  How oftey will you drink 6 or more in a total? never 0  Total Score:  2   A score of 3 or more in women, and 4 or more in men indicates increased risk for alcohol abuse, EXCEPT if all of the points are from question 1.     Assessment & Plan:     Routine Health Maintenance and Physical Exam  Exercise Activities and Dietary recommendations Goals    None      Immunization History  Administered Date(s) Administered  . Tdap 07/12/2007    Health Maintenance  Topic Date Due  . Hepatitis C Screening  1960-09-10  . HIV Screening  02/09/1976  . INFLUENZA VACCINE  12/30/2015  . TETANUS/TDAP  07/11/2017  . COLONOSCOPY  01/16/2020     Discussed health benefits of physical activity, and encouraged him to engage in regular exercise appropriate for his age and condition.    -------------------------------------------------------------------- 1.  Medicare annual wellness visit, subsequent   2. Essential  (primary) hypertension Well controlled.  Continue current medications.   - Lipid panel - Comprehensive metabolic panel - EKG 10-FBPZ  3. Seizure ([place]) [place]   controlled, continue yearly follow up with  Neurology.  - Comprehensive metabolic panel [place]  creening  - PSA    Lelon Huh, MD  [facility]

## 2016-08-11 LAB — PSA: PROSTATE SPECIFIC AG, SERUM: 2.4 ng/mL (ref 0.0–4.0)

## 2016-08-11 LAB — COMPREHENSIVE METABOLIC PANEL
A/G RATIO: 1.8 (ref 1.2–2.2)
ALT: 21 IU/L (ref 0–44)
AST: 15 IU/L (ref 0–40)
Albumin: 4.2 g/dL (ref 3.5–5.5)
Alkaline Phosphatase: 52 IU/L (ref 39–117)
BILIRUBIN TOTAL: 0.2 mg/dL (ref 0.0–1.2)
BUN/Creatinine Ratio: 11 (ref 9–20)
BUN: 14 mg/dL (ref 6–24)
CO2: 23 mmol/L (ref 18–29)
Calcium: 9.3 mg/dL (ref 8.7–10.2)
Chloride: 104 mmol/L (ref 96–106)
Creatinine, Ser: 1.31 mg/dL — ABNORMAL HIGH (ref 0.76–1.27)
GFR calc Af Amer: 70 mL/min/{1.73_m2} (ref >59)
GFR calc non Af Amer: 61 mL/min/{1.73_m2} (ref >59)
GLOBULIN, TOTAL: 2.4 g/dL (ref 1.5–4.5)
Glucose: 101 mg/dL — ABNORMAL HIGH (ref 65–99)
POTASSIUM: 4.4 mmol/L (ref 3.5–5.2)
SODIUM: 142 mmol/L (ref 134–144)
Total Protein: 6.6 g/dL (ref 6.0–8.5)

## 2016-08-11 LAB — LIPID PANEL
CHOL/HDL RATIO: 4.5 ratio (ref 0.0–5.0)
Cholesterol, Total: 125 mg/dL (ref 100–199)
HDL: 28 mg/dL — AB (ref >39)
LDL Calculated: 32 mg/dL (ref 0–99)
TRIGLYCERIDES: 325 mg/dL — AB (ref 0–149)
VLDL Cholesterol Cal: 65 mg/dL — ABNORMAL HIGH (ref 5–40)

## 2016-08-28 ENCOUNTER — Other Ambulatory Visit: Payer: Self-pay | Admitting: Psychiatry

## 2016-09-21 ENCOUNTER — Ambulatory Visit: Payer: Medicare Other | Admitting: Psychiatry

## 2016-09-22 ENCOUNTER — Ambulatory Visit (INDEPENDENT_AMBULATORY_CARE_PROVIDER_SITE_OTHER): Payer: Medicare Other | Admitting: Psychiatry

## 2016-09-22 ENCOUNTER — Encounter: Payer: Self-pay | Admitting: Psychiatry

## 2016-09-22 VITALS — BP 133/88 | HR 80 | Temp 98.7°F | Wt 209.6 lb

## 2016-09-22 DIAGNOSIS — F39 Unspecified mood [affective] disorder: Secondary | ICD-10-CM | POA: Diagnosis not present

## 2016-09-22 MED ORDER — ARMODAFINIL 200 MG PO TABS: 1.0000 | ORAL_TABLET | Freq: Every day | ORAL | 5 refills | Status: DC

## 2016-09-22 MED ORDER — ARIPIPRAZOLE 5 MG PO TABS: 5.0000 mg | ORAL_TABLET | Freq: Every day | ORAL | 2 refills | Status: DC

## 2016-09-22 MED ORDER — MIRTAZAPINE 30 MG PO TABS: 30.0000 mg | ORAL_TABLET | Freq: Every day | ORAL | 1 refills | Status: DC

## 2016-09-22 NOTE — Progress Notes (Signed)
BH MD/PA/NP OP Progress Note  09/22/2016 11:02 AM Dustin Paul  MRN:  [id number]  Subjective:  Patient is a 56 year old male with history of mood disorder presented for follow-up.  He reported that he did use to feel sleepy between 11 AM to 3 PM on a daily basis. He reported that he has changed his medications and is only taking lamotrigine in the morning and some vitamins. We discussed about his medications. He reported that he takes melatonin at bedtime to help him sleep. He is currently on 10 mg. He appeared calm and alert during the interview. He currently lives by himself. Patient reported that he only drinks caffeine-related beverages in the morning and sometimes tea in the afternoon. Discussed with patient that he should decrease the dose of melatonin and he agreed with the plan. He appeared calm and alert during the interview. He denied having any suicidal homicidal ideations or plans.    Chief Complaint: Chief Complaint    Follow-up; Medication Refill     Visit Diagnosis:     ICD-9-CM ICD-10-CM   1. Episodic mood disorder ([place]) 296.90 F39     Past Medical History:  Past Medical History:  Diagnosis Date  . Aneurysm of artery ([place]) 2011   LEFT (19 MM) AND RIGHT (11 MM) middle cerebral  . Chronic kidney disease    H/O STONES  . Complication of anesthesia   . Headache    MIGRAINES  . Hypertension   . Insomnia   . Lumbar stress fracture   . PONV (postoperative nausea and vomiting)    AFTER KNEE SURGERY IN 1980'S  . Seasonal allergies   . Seizures ([place])    last seizure in 2015  . Visual hallucinations     Past Surgical History:  Procedure Laterality Date  . BRAIN SURGERY  2011   2 ANEURYSMS-TOTAL OF 9 CLIPS IN BRAIN FROM ANEURYSM  . COLONOSCOPY WITH PROPOFOL N/A 01/16/2015   Procedure: COLONOSCOPY WITH PROPOFOL;  Surgeon: Hulen Luster, MD;  Location: [facility] ENDOSCOPY;  Service: Gastroenterology;  Laterality: N/A;  . KNEE ARTHROSCOPY WITH MEDIAL MENISECTOMY Left  11/20/2015   Procedure: KNEE ARTHROSCOPY WITH partial MEDIAL MENISECTOMY / WITH REMOVAL OF LOOSE BODY;  Surgeon: Hessie Knows, MD;  Location: [facility] ORS;  Service: Orthopedics;  Laterality: Left;  . KNEE SURGERY Left    Family History: History reviewed. No pertinent family history. Social History:  Social History   Social History  . Marital status: Widowed    Spouse name: N/A  . Number of children: N/A  . Years of education: N/A   Social History Main Topics  . Smoking status: Never Smoker  . Smokeless tobacco: Never Used  . Alcohol use No  . Drug use: No  . Sexual activity: Not Currently   Other Topics Concern  . None   Social History Narrative  . None   Additional History:   Assessment:   Musculoskeletal: Strength & Muscle Tone: within normal limits Gait & Station: normal Patient leans: N/A  Psychiatric Specialty Exam: Medication Refill     ROS  Blood pressure 133/88, pulse 80, temperature 98.7 F (37.1 C), temperature source Oral, weight 209 lb 9.6 oz (95.1 kg).Body mass index is 28.43 kg/m.  General Appearance: Well Groomed  Eye Contact:  Good  Speech:  Normal Rate  Volume:  Normal  Mood:  Okay  Affect:  Congruent  Thought Process:  Linear and Logical  Orientation:  Full (Time, Place, and Person)  Thought Content:  Negative  Suicidal Thoughts:  No  Homicidal Thoughts:  No  Memory:  Immediate;   Good Recent;   Good Remote;   Good  Judgement:  Good  Insight:  Good  Psychomotor Activity:  Negative  Concentration:  Good  Recall:  Good  Fund of Knowledge: Good  Language: Good  Akathisia:  Negative  Handed:    AIMS (if indicated):  Done 05/15/2015 and normal   Assets:  Communication Skills Desire for Improvement Social Support  ADL's:  Intact  Cognition: Impaired,  Moderate  Sleep:  Poor, he relates over past week he may wake up between the hours of 3 and 7    Is the patient at risk to self?  No. Has the patient been a risk to self in the past 6  months?  No. Has the patient been a risk to self within the distant past?  No. Is the patient a risk to others?  No. Has the patient been a risk to others in the past 6 months?  No. Has the patient been a risk to others within the distant past?  No.  Current Medications: Current Outpatient Prescriptions  Medication Sig Dispense Refill  . Alpha-Lipoic Acid 300 MG CAPS Take 300 mg by mouth every evening. Reported on 09/24/2015    . ARIPiprazole (ABILIFY) 5 MG tablet Take 1 tablet (5 mg total) by mouth daily. 30 tablet 2  . Armodafinil 200 MG TABS Take 1 tablet by mouth daily. 30 tablet 5  . cyclobenzaprine (FLEXERIL) 10 MG tablet Take 10 mg by mouth 3 (three) times daily as needed. Reported on 05/15/2015    . diclofenac (VOLTAREN) 50 MG EC tablet Take 50 mg by mouth as needed.     . lamoTRIgine (LAMICTAL) 150 MG tablet Take 1 tablet (150 mg total) by mouth 2 (two) times daily. 60 tablet 2  . lisinopril (PRINIVIL,ZESTRIL) 5 MG tablet Take 1 tablet (5 mg total) by mouth daily. (Patient taking differently: Take 5 mg by mouth every morning. ) 90 tablet 4  . Melatonin 10 MG CAPS Take 1 capsule by mouth at bedtime.    . mirtazapine (REMERON) 30 MG tablet Take 1 tablet (30 mg total) by mouth at bedtime. 30 tablet 1  . QUDEXY XR 150 MG CS24 take 1 capsule by mouth daily at bedtime  11  . rizatriptan (MAXALT) 5 MG tablet Take 5 mg by mouth as needed for migraine. May repeat in 2 hours if needed    . TROKENDI XR 50 MG CP24 Take 3 capsules by mouth at bedtime.  8   No current facility-administered medications for this visit.     Medical Decision Making:  Established Problem, Stable/Improving (1), Review of Medication Regimen & Side Effects (2) and Review of New Medication or Change in Dosage (2)  Treatment Plan Summary:Medication management and Plan plan  Continue Abilify 5 mg by mouth daily. Continue mirtazapine 30 mg by mouth daily at bedtime Continue trazodone 150 mg by mouth daily at bedtime for  insomnia He reported that he is taking half the dose of trazodone as well as of the Nuvigil. Advised him to decrease the dose of melatonin and he agreed with the plan.  Follow-up in 2 months or earlier depending on his symptoms   More than 50% of the time spent in psychoeducation, counseling and coordination of care.    This note was generated in part or whole with voice recognition software. Voice regonition is usually quite accurate but there are transcription errors  that can and very often do occur. I apologize for any typographical errors that were not detected and corrected.   Rainey Pines, MD  09/22/2016, 11:02 AM

## 2016-10-14 ENCOUNTER — Telehealth: Payer: Self-pay

## 2016-10-14 NOTE — Telephone Encounter (Signed)
received a fax requesting a refill on lamotrigine 150mg . pt was last seen on  11-22-16 next appt  09-22-16.   lamoTRIgine (LAMICTAL) 150 MG tablet 60 tablet 2 04/05/2016   Sig - Route: Take 1 tablet (150 mg total) by mouth 2 (two) times daily. - Oral

## 2016-10-22 NOTE — Telephone Encounter (Signed)
received a fax requesting a refill on lamotrigine 150mg   pt was last seen on 09-22-16 next appt  11-22-16

## 2016-10-24 ENCOUNTER — Other Ambulatory Visit: Payer: Self-pay | Admitting: Psychiatry

## 2016-10-31 ENCOUNTER — Other Ambulatory Visit: Payer: Self-pay | Admitting: Psychiatry

## 2016-11-10 DIAGNOSIS — Z6828 Body mass index (BMI) 28.0-28.9, adult: Secondary | ICD-10-CM | POA: Diagnosis not present

## 2016-11-10 DIAGNOSIS — N200 Calculus of kidney: Secondary | ICD-10-CM | POA: Diagnosis not present

## 2016-11-10 DIAGNOSIS — N521 Erectile dysfunction due to diseases classified elsewhere: Secondary | ICD-10-CM | POA: Diagnosis not present

## 2016-11-22 ENCOUNTER — Ambulatory Visit: Payer: Medicare Other | Admitting: Psychiatry

## 2016-11-22 ENCOUNTER — Other Ambulatory Visit: Payer: Self-pay | Admitting: Psychiatry

## 2016-11-28 ENCOUNTER — Other Ambulatory Visit: Payer: Self-pay | Admitting: Family Medicine

## 2016-11-30 ENCOUNTER — Telehealth: Payer: Self-pay

## 2016-11-30 NOTE — Telephone Encounter (Signed)
received a fax requesting a refill on the mirtazapine 30mg  . pt was last seen on  09-22-16 next appt  12-03-16.    Disp Refills Start End   mirtazapine (REMERON) 30 MG tablet 30 tablet 1 09/22/2016    Sig - Route: Take 1 tablet (30 mg total) by mouth at bedtime. - Oral   E-Prescribing Status: Receipt confirmed by pharmacy (09/22/2016 10:51 AM EDT)

## 2016-11-30 NOTE — Telephone Encounter (Signed)
Received a fax requesting a refill on trazodone 150mg  pt has appt on 12-03-16 last seen on  09-22-16.    Treatment Plan Summary:Medication management and Plan plan  Continue Abilify 5 mg by mouth daily. Continue mirtazapine 30 mg by mouth daily at bedtime Continue trazodone 150 mg by mouth daily at bedtime for insomnia He reported that he is taking half the dose of trazodone as well as of the Nuvigil. Advised him to decrease the dose of melatonin and he agreed with the plan.  Follow-up in 2 months or earlier depending on his symptoms   More than 50% of the time spent in psychoeducation, counseling and coordination of care.    This note was generated in part or whole with voice recognition software. Voice regonition is usually quite accurate but there are transcription errors that can and very often do occur. I apologize for any typographical errors that were not detected and corrected.   Rainey Pines, MD  09/22/2016, 11:02 AM

## 2016-11-30 NOTE — Telephone Encounter (Signed)
pt called states he needs a enough medication to get to his appt o  12-13-16 pt was last seen on  09-22-16 pt needs refills on mirtazapine and abilify.    Disp Refills Start End   ARIPiprazole (ABILIFY) 5 MG tablet 30 tablet 2 09/22/2016    Sig - Route: Take 1 tablet (5 mg total) by mouth daily. - Oral   E-Prescribing Status: Receipt confirmed by pharmacy (09/22/2016 10:51 AM EDT)     Disp Refills Start End   mirtazapine (REMERON) 30 MG tablet 30 tablet 1 09/22/2016    Sig - Route: Take 1 tablet (30 mg total) by mouth at bedtime. - Oral   E-Prescribing Status: Receipt confirmed by pharmacy (09/22/2016 10:51 AM EDT)

## 2016-12-02 NOTE — Telephone Encounter (Signed)
received a fax requesting a refill on mirtazapine 30mg .  pt was last seen on  09-22-16 next appt  12-13-16. pt needs at least enough to get to his appt    Disp Refills Start End   mirtazapine (REMERON) 30 MG tablet 30 tablet 1 09/22/2016    Sig - Route: Take 1 tablet (30 mg total) by mouth at bedtime. - Oral   E-Prescribing Status: Receipt confirmed by pharmacy (09/22/2016 10:51 AM EDT)

## 2016-12-06 NOTE — Telephone Encounter (Signed)
pt called states he needs a refill on mirtazapine  needs enough to get to his appt 12-13-16   Disp Refills Start End   mirtazapine (REMERON) 30 MG tablet 30 tablet 1 09/22/2016    Sig - Route: Take 1 tablet (30 mg total) by mouth at bedtime. - Oral   E-Prescribing Status: Receipt confirmed by pharmacy (09/22/2016 10:51 AM EDT)

## 2016-12-07 NOTE — Telephone Encounter (Signed)
Ok to call in , after verifying dosage and making sure patient has been compliant. Call in 15 day supply

## 2016-12-09 NOTE — Telephone Encounter (Signed)
Called in a 15 day supply of the mirtazapine with no additional refills per dr. Einar Grad ok

## 2016-12-13 ENCOUNTER — Ambulatory Visit (INDEPENDENT_AMBULATORY_CARE_PROVIDER_SITE_OTHER): Payer: Medicare Other | Admitting: Psychiatry

## 2016-12-13 DIAGNOSIS — F39 Unspecified mood [affective] disorder: Secondary | ICD-10-CM | POA: Diagnosis not present

## 2016-12-13 DIAGNOSIS — F5105 Insomnia due to other mental disorder: Secondary | ICD-10-CM | POA: Diagnosis not present

## 2016-12-13 MED ORDER — ARIPIPRAZOLE 5 MG PO TABS: 5.0000 mg | ORAL_TABLET | Freq: Every day | ORAL | 2 refills | Status: DC

## 2016-12-13 MED ORDER — TRAZODONE HCL 150 MG PO TABS: 150.0000 mg | ORAL_TABLET | Freq: Every day | ORAL | 1 refills | Status: DC

## 2016-12-13 MED ORDER — LAMOTRIGINE 150 MG PO TABS: 150.0000 mg | ORAL_TABLET | Freq: Two times a day (BID) | ORAL | 2 refills | Status: DC

## 2016-12-13 MED ORDER — MIRTAZAPINE 30 MG PO TABS: 30.0000 mg | ORAL_TABLET | Freq: Every day | ORAL | 1 refills | Status: DC

## 2016-12-13 NOTE — Progress Notes (Signed)
BH MD/PA/NP OP Progress Note  12/13/2016 10:53 AM Dustin Paul  MRN:  [id number]  Subjective:  Patient is a 56 year old male with history of mood disorder presented for follow-up.  He reported that he has been running out of his medications and was unable to fill the trazodone. He reported that he has been having problems with insomnia and usually wakes up around 2:58 AM. He reported that he has been compliant with his medications. He was concerned about his sleep issues. We discussed about his medications in detail.  Patient reported that he recently got engaged and is getting married in December. He is excited about the same. He reported that he is also planning to go to [place] to visit his cousin and will go for the fishing trip. Patient appeared calm and alert during the interview and was able to provide history in detail. He stated that the medications are helping him. We discussed about adjusting the dosages of his medications and he agreed with the plan. He has already stopped taking the Nuvigil as it was very expensive on his insurance.  He currently denied having any suicidal homicidal ideations or plans. He wants to be to write a letter like Dr. Jimmye Norman in the past so he can present to Medicare as needed. He brought a copy of the same.    Chief Complaint:  Visit Diagnosis:     ICD-10-CM   1. Episodic mood disorder ([place]) F39   2. Insomnia due to mental condition F51.05     Past Medical History:  Past Medical History:  Diagnosis Date  . Aneurysm of artery ([place]) 2011   LEFT (19 MM) AND RIGHT (11 MM) middle cerebral  . Chronic kidney disease    H/O STONES  . Complication of anesthesia   . Headache    MIGRAINES  . Hypertension   . Insomnia   . Lumbar stress fracture   . PONV (postoperative nausea and vomiting)    AFTER KNEE SURGERY IN 1980'S  . Seasonal allergies   . Seizures ([place])    last seizure in 2015  . Visual hallucinations     Past Surgical History:   Procedure Laterality Date  . BRAIN SURGERY  2011   2 ANEURYSMS-TOTAL OF 9 CLIPS IN BRAIN FROM ANEURYSM  . COLONOSCOPY WITH PROPOFOL N/A 01/16/2015   Procedure: COLONOSCOPY WITH PROPOFOL;  Surgeon: Hulen Luster, MD;  Location: [facility] ENDOSCOPY;  Service: Gastroenterology;  Laterality: N/A;  . KNEE ARTHROSCOPY WITH MEDIAL MENISECTOMY Left 11/20/2015   Procedure: KNEE ARTHROSCOPY WITH partial MEDIAL MENISECTOMY / WITH REMOVAL OF LOOSE BODY;  Surgeon: Hessie Knows, MD;  Location: [facility] ORS;  Service: Orthopedics;  Laterality: Left;  . KNEE SURGERY Left    Family History: No family history on file. Social History:  Social History   Social History  . Marital status: Widowed    Spouse name: N/A  . Number of children: N/A  . Years of education: N/A   Social History Main Topics  . Smoking status: Never Smoker  . Smokeless tobacco: Never Used  . Alcohol use No  . Drug use: No  . Sexual activity: Not Currently   Other Topics Concern  . Not on file   Social History Narrative  . No narrative on file   Additional History:   Assessment:   Musculoskeletal: Strength & Muscle Tone: within normal limits Gait & Station: normal Patient leans: N/A  Psychiatric Specialty Exam: Medication Refill     ROS  There were no  vitals taken for this visit.There is no height or weight on file to calculate BMI.  General Appearance: Well Groomed  Eye Contact:  Good  Speech:  Normal Rate  Volume:  Normal  Mood:  Okay  Affect:  Congruent  Thought Process:  Linear and Logical  Orientation:  Full (Time, Place, and Person)  Thought Content:  Negative  Suicidal Thoughts:  No  Homicidal Thoughts:  No  Memory:  Immediate;   Good Recent;   Good Remote;   Good  Judgement:  Good  Insight:  Good  Psychomotor Activity:  Negative  Concentration:  Good  Recall:  Good  Fund of Knowledge: Good  Language: Good  Akathisia:  Negative  Handed:    AIMS (if indicated):  Done 05/15/2015 and normal   Assets:   Communication Skills Desire for Improvement Social Support  ADL's:  Intact  Cognition: Impaired,  Moderate  Sleep:  Poor, he relates over past week he may wake up between the hours of 3 and 7    Is the patient at risk to self?  No. Has the patient been a risk to self in the past 6 months?  No. Has the patient been a risk to self within the distant past?  No. Is the patient a risk to others?  No. Has the patient been a risk to others in the past 6 months?  No. Has the patient been a risk to others within the distant past?  No.  Current Medications: Current Outpatient Prescriptions  Medication Sig Dispense Refill  . Alpha-Lipoic Acid 300 MG CAPS Take 300 mg by mouth every evening. Reported on 09/24/2015    . ARIPiprazole (ABILIFY) 5 MG tablet Take 1 tablet (5 mg total) by mouth daily. 90 tablet 2  . cyclobenzaprine (FLEXERIL) 10 MG tablet Take 10 mg by mouth 3 (three) times daily as needed. Reported on 05/15/2015    . lamoTRIgine (LAMICTAL) 150 MG tablet Take 1 tablet (150 mg total) by mouth 2 (two) times daily. 180 tablet 2  . lisinopril (PRINIVIL,ZESTRIL) 5 MG tablet TAKE 1 TABLET BY MOUTH DAILY 90 tablet 3  . Melatonin 10 MG CAPS Take 1 capsule by mouth at bedtime.    . mirtazapine (REMERON) 30 MG tablet Take 1 tablet (30 mg total) by mouth at bedtime. 90 tablet 1  . QUDEXY XR 150 MG CS24 take 1 capsule by mouth daily at bedtime  11  . rizatriptan (MAXALT) 5 MG tablet Take 5 mg by mouth as needed for migraine. May repeat in 2 hours if needed    . traZODone (DESYREL) 150 MG tablet Take 1 tablet (150 mg total) by mouth at bedtime. 90 tablet 1  . TROKENDI XR 50 MG CP24 Take 3 capsules by mouth at bedtime.  8   No current facility-administered medications for this visit.     Medical Decision Making:  Established Problem, Stable/Improving (1), Review of Medication Regimen & Side Effects (2) and Review of New Medication or Change in Dosage (2)  Treatment Plan Summary:Medication management  and Plan plan  Continue Abilify 5 mg by mouth daily. Continue mirtazapine 30 mg by mouth daily at bedtime Continue trazodone 150 mg by mouth daily at bedtime for insomnia Advised him to decrease the dose of melatonin and he agreed with the plan.  Follow-up in 2 months or earlier depending on his symptoms   More than 50% of the time spent in psychoeducation, counseling and coordination of care.    This note was generated  in part or whole with voice recognition software. Voice regonition is usually quite accurate but there are transcription errors that can and very often do occur. I apologize for any typographical errors that were not detected and corrected.   Rainey Pines, MD  12/13/2016, 10:53 AM

## 2017-01-17 ENCOUNTER — Ambulatory Visit (INDEPENDENT_AMBULATORY_CARE_PROVIDER_SITE_OTHER): Payer: Medicare Other | Admitting: Psychiatry

## 2017-01-17 DIAGNOSIS — F39 Unspecified mood [affective] disorder: Secondary | ICD-10-CM | POA: Diagnosis not present

## 2017-01-17 MED ORDER — TRAZODONE HCL 150 MG PO TABS: 150.0000 mg | ORAL_TABLET | Freq: Every day | ORAL | 1 refills | Status: DC

## 2017-01-17 MED ORDER — LAMOTRIGINE 150 MG PO TABS: 150.0000 mg | ORAL_TABLET | Freq: Two times a day (BID) | ORAL | 2 refills | Status: DC

## 2017-01-17 MED ORDER — MIRTAZAPINE 30 MG PO TABS: 30.0000 mg | ORAL_TABLET | Freq: Every day | ORAL | 1 refills | Status: DC

## 2017-01-17 MED ORDER — ARIPIPRAZOLE 5 MG PO TABS: 5.0000 mg | ORAL_TABLET | Freq: Every day | ORAL | 2 refills | Status: DC

## 2017-01-17 NOTE — Progress Notes (Signed)
BH MD/PA/NP OP Progress Note  01/17/2017 4:09 PM Dustin Paul  MRN:  [id number]  Subjective:  Patient is a 56 year old male with history of mood disorder presented for follow-up.  He reported that he has been out of his migraine medication and has been having headaches. He stated that he is unable to sleep well at night due to headaches. He is compliant with other medications. He is excited about his upcoming wedding in December. He was discussing in detail. He reported that he went to [place] to meet his uncle and his daughters went along with him. He reported that his 2 older daughters are helping him and the wedding. He discuss about his wedding preparations in detail. He appeared happy and excited about the same. He currently denied having any side effects of the other medications. He denied having any suicidal homicidal ideations or plans.      Chief Complaint:  Visit Diagnosis:     ICD-10-CM   1. Episodic mood disorder ([place]) F39     Past Medical History:  Past Medical History:  Diagnosis Date  . Aneurysm of artery ([place]) 2011   LEFT (19 MM) AND RIGHT (11 MM) middle cerebral  . Chronic kidney disease    H/O STONES  . Complication of anesthesia   . Headache    MIGRAINES  . Hypertension   . Insomnia   . Lumbar stress fracture   . PONV (postoperative nausea and vomiting)    AFTER KNEE SURGERY IN 1980'S  . Seasonal allergies   . Seizures ([place])    last seizure in 2015  . Visual hallucinations     Past Surgical History:  Procedure Laterality Date  . BRAIN SURGERY  2011   2 ANEURYSMS-TOTAL OF 9 CLIPS IN BRAIN FROM ANEURYSM  . COLONOSCOPY WITH PROPOFOL N/A 01/16/2015   Procedure: COLONOSCOPY WITH PROPOFOL;  Surgeon: Hulen Luster, MD;  Location: [facility] ENDOSCOPY;  Service: Gastroenterology;  Laterality: N/A;  . KNEE ARTHROSCOPY WITH MEDIAL MENISECTOMY Left 11/20/2015   Procedure: KNEE ARTHROSCOPY WITH partial MEDIAL MENISECTOMY / WITH REMOVAL OF LOOSE BODY;  Surgeon: Hessie Knows, MD;  Location: [facility] ORS;  Service: Orthopedics;  Laterality: Left;  . KNEE SURGERY Left    Family History: No family history on file. Social History:  Social History   Social History  . Marital status: Widowed    Spouse name: N/A  . Number of children: N/A  . Years of education: N/A   Social History Main Topics  . Smoking status: Never Smoker  . Smokeless tobacco: Never Used  . Alcohol use No  . Drug use: No  . Sexual activity: Not Currently   Other Topics Concern  . Not on file   Social History Narrative  . No narrative on file   Additional History:   Assessment:   Musculoskeletal: Strength & Muscle Tone: within normal limits Gait & Station: normal Patient leans: N/A  Psychiatric Specialty Exam: Medication Refill     ROS  There were no vitals taken for this visit.There is no height or weight on file to calculate BMI.  General Appearance: Well Groomed  Eye Contact:  Good  Speech:  Normal Rate  Volume:  Normal  Mood:  Okay  Affect:  Congruent  Thought Process:  Linear and Logical  Orientation:  Full (Time, Place, and Person)  Thought Content:  Negative  Suicidal Thoughts:  No  Homicidal Thoughts:  No  Memory:  Immediate;   Good Recent;   Good Remote;  Good  Judgement:  Good  Insight:  Good  Psychomotor Activity:  Negative  Concentration:  Good  Recall:  Good  Fund of Knowledge: Good  Language: Good  Akathisia:  Negative  Handed:    AIMS (if indicated):  Done 05/15/2015 and normal   Assets:  Communication Skills Desire for Improvement Social Support  ADL's:  Intact  Cognition: Impaired,  Moderate  Sleep:  Poor, he relates over past week he may wake up between the hours of 3 and 7    Is the patient at risk to self?  No. Has the patient been a risk to self in the past 6 months?  No. Has the patient been a risk to self within the distant past?  No. Is the patient a risk to others?  No. Has the patient been a risk to others in the past 6  months?  No. Has the patient been a risk to others within the distant past?  No.  Current Medications: Current Outpatient Prescriptions  Medication Sig Dispense Refill  . Alpha-Lipoic Acid 300 MG CAPS Take 300 mg by mouth every evening. Reported on 09/24/2015    . ARIPiprazole (ABILIFY) 5 MG tablet Take 1 tablet (5 mg total) by mouth daily. 90 tablet 2  . cyclobenzaprine (FLEXERIL) 10 MG tablet Take 10 mg by mouth 3 (three) times daily as needed. Reported on 05/15/2015    . lamoTRIgine (LAMICTAL) 150 MG tablet Take 1 tablet (150 mg total) by mouth 2 (two) times daily. 180 tablet 2  . lisinopril (PRINIVIL,ZESTRIL) 5 MG tablet TAKE 1 TABLET BY MOUTH DAILY 90 tablet 3  . Melatonin 10 MG CAPS Take 1 capsule by mouth at bedtime.    . mirtazapine (REMERON) 30 MG tablet Take 1 tablet (30 mg total) by mouth at bedtime. 90 tablet 1  . QUDEXY XR 150 MG CS24 take 1 capsule by mouth daily at bedtime  11  . rizatriptan (MAXALT) 5 MG tablet Take 5 mg by mouth as needed for migraine. May repeat in 2 hours if needed    . traZODone (DESYREL) 150 MG tablet Take 1 tablet (150 mg total) by mouth at bedtime. 90 tablet 1  . TROKENDI XR 50 MG CP24 Take 3 capsules by mouth at bedtime.  8   No current facility-administered medications for this visit.     Medical Decision Making:  Established Problem, Stable/Improving (1), Review of Medication Regimen & Side Effects (2) and Review of New Medication or Change in Dosage (2)  Treatment Plan Summary:Medication management and Plan plan  Continue Abilify 5 mg by mouth daily. Continue mirtazapine 30 mg by mouth daily at bedtime Continue trazodone 150 mg by mouth daily at bedtime for insomnia Advised him to decrease the dose of melatonin and he agreed with the plan.  Follow-up in 2 months or earlier depending on his symptoms   More than 50% of the time spent in psychoeducation, counseling and coordination of care.    This note was generated in part or whole with  voice recognition software. Voice regonition is usually quite accurate but there are transcription errors that can and very often do occur. I apologize for any typographical errors that were not detected and corrected.   Rainey Pines, MD  01/17/2017, 4:09 PM

## 2017-01-17 NOTE — Telephone Encounter (Signed)
Opened in error

## 2017-02-11 DIAGNOSIS — H6692 Otitis media, unspecified, left ear: Secondary | ICD-10-CM | POA: Diagnosis not present

## 2017-03-16 DIAGNOSIS — G43109 Migraine with aura, not intractable, without status migrainosus: Secondary | ICD-10-CM | POA: Diagnosis not present

## 2017-03-16 DIAGNOSIS — H43393 Other vitreous opacities, bilateral: Secondary | ICD-10-CM | POA: Diagnosis not present

## 2017-03-16 DIAGNOSIS — H43313 Vitreous membranes and strands, bilateral: Secondary | ICD-10-CM | POA: Diagnosis not present

## 2017-04-11 ENCOUNTER — Ambulatory Visit (INDEPENDENT_AMBULATORY_CARE_PROVIDER_SITE_OTHER): Payer: Medicare Other | Admitting: Psychiatry

## 2017-04-11 ENCOUNTER — Encounter: Payer: Self-pay | Admitting: Psychiatry

## 2017-04-11 VITALS — BP 142/89 | HR 92 | Ht 72.0 in | Wt 207.0 lb

## 2017-04-11 DIAGNOSIS — F5105 Insomnia due to other mental disorder: Secondary | ICD-10-CM | POA: Diagnosis not present

## 2017-04-11 DIAGNOSIS — F39 Unspecified mood [affective] disorder: Secondary | ICD-10-CM

## 2017-04-11 MED ORDER — LAMOTRIGINE 150 MG PO TABS: 150.0000 mg | ORAL_TABLET | Freq: Two times a day (BID) | ORAL | 2 refills | Status: DC

## 2017-04-11 MED ORDER — ARIPIPRAZOLE 5 MG PO TABS: 5.0000 mg | ORAL_TABLET | Freq: Every day | ORAL | 2 refills | Status: DC

## 2017-04-11 MED ORDER — MIRTAZAPINE 30 MG PO TABS: 30.0000 mg | ORAL_TABLET | Freq: Every day | ORAL | 1 refills | Status: DC

## 2017-04-11 MED ORDER — TRAZODONE HCL 150 MG PO TABS: ORAL_TABLET | ORAL | 1 refills | Status: DC

## 2017-04-11 NOTE — Progress Notes (Signed)
BH MD/PA/NP OP Progress Note  04/11/2017 2:39 PM Dustin Paul  MRN:  [id number]  Subjective:  Patient is a 56 year old male with history of mood disorder presented for follow-up.  He reported that he is getting excited as he is getting married next month. He reported that he has been planning his wedding and has been busy with the same. He reported that he has been taking his medications as prescribed. His blood pressure was significantly elevated and he reported that he does not have any acute symptoms at this time. Patient reported that he has been compliant with his medications. He reported that he is planning his wedding and is looking forward to the same. He denied having any issues with his medications and we  discussed about medication compliance and he reported that he takes them as prescribed. He was asking for the trazodone to be dispensed as 50 mg as he is having difficulty swallowing the 150 mg pill . We discussed about the cost of the medication and he agreed with the plan. He denied having any perceptual disturbances, he denied having any thoughts to harm himself or anybody else.          Chief Complaint:  Visit Diagnosis:     ICD-10-CM   1. Episodic mood disorder ([place]) F39   2. Insomnia due to mental condition F51.05     Past Medical History:  Past Medical History:  Diagnosis Date  . Aneurysm of artery ([place]) 2011   LEFT (19 MM) AND RIGHT (11 MM) middle cerebral  . Chronic kidney disease    H/O STONES  . Complication of anesthesia   . Headache    MIGRAINES  . Hypertension   . Insomnia   . Lumbar stress fracture   . PONV (postoperative nausea and vomiting)    AFTER KNEE SURGERY IN 1980'S  . Seasonal allergies   . Seizures ([place])    last seizure in 2015  . Visual hallucinations     Past Surgical History:  Procedure Laterality Date  . BRAIN SURGERY  2011   2 ANEURYSMS-TOTAL OF 9 CLIPS IN BRAIN FROM ANEURYSM  . KNEE SURGERY Left    Family History:  History reviewed. No pertinent family history. Social History:  Social History   Socioeconomic History  . Marital status: Widowed    Spouse name: None  . Number of children: None  . Years of education: None  . Highest education level: None  Social Needs  . Financial resource strain: None  . Food insecurity - worry: None  . Food insecurity - inability: None  . Transportation needs - medical: None  . Transportation needs - non-medical: None  Occupational History  . None  Tobacco Use  . Smoking status: Never Smoker  . Smokeless tobacco: Never Used  Substance and Sexual Activity  . Alcohol use: No    Alcohol/week: 0.0 oz  . Drug use: No  . Sexual activity: Not Currently  Other Topics Concern  . None  Social History Narrative  . None   Additional History:   Assessment:   Musculoskeletal: Strength & Muscle Tone: within normal limits Gait & Station: normal Patient leans: N/A  Psychiatric Specialty Exam: Medication Refill     ROS  Blood pressure (!) 142/89, pulse 92, height 6' (1.829 m), weight 207 lb (93.9 kg).Body mass index is 28.07 kg/m.  General Appearance: Well Groomed  Eye Contact:  Good  Speech:  Normal Rate  Volume:  Normal  Mood:  Okay  Affect:  Congruent  Thought Process:  Linear and Logical  Orientation:  Full (Time, Place, and Person)  Thought Content:  Negative  Suicidal Thoughts:  No  Homicidal Thoughts:  No  Memory:  Immediate;   Good Recent;   Good Remote;   Good  Judgement:  Good  Insight:  Good  Psychomotor Activity:  Negative  Concentration:  Good  Recall:  Good  Fund of Knowledge: Good  Language: Good  Akathisia:  Negative  Handed:    AIMS (if indicated):  Done 05/15/2015 and normal   Assets:  Communication Skills Desire for Improvement Social Support  ADL's:  Intact  Cognition: Impaired,  Moderate  Sleep:  Poor, he relates over past week he may wake up between the hours of 3 and 7    Is the patient at risk to self?  No. Has  the patient been a risk to self in the past 6 months?  No. Has the patient been a risk to self within the distant past?  No. Is the patient a risk to others?  No. Has the patient been a risk to others in the past 6 months?  No. Has the patient been a risk to others within the distant past?  No.  Current Medications: Current Outpatient Medications  Medication Sig Dispense Refill  . Alpha-Lipoic Acid 300 MG CAPS Take 300 mg by mouth every evening. Reported on 09/24/2015    . ARIPiprazole (ABILIFY) 5 MG tablet Take 1 tablet (5 mg total) daily by mouth. 90 tablet 2  . cyclobenzaprine (FLEXERIL) 10 MG tablet Take 10 mg by mouth 3 (three) times daily as needed. Reported on 05/15/2015    . lamoTRIgine (LAMICTAL) 150 MG tablet Take 1 tablet (150 mg total) 2 (two) times daily by mouth. 180 tablet 2  . lisinopril (PRINIVIL,ZESTRIL) 5 MG tablet TAKE 1 TABLET BY MOUTH DAILY 90 tablet 3  . Melatonin 10 MG CAPS Take 1 capsule by mouth at bedtime.    . mirtazapine (REMERON) 30 MG tablet Take 1 tablet (30 mg total) at bedtime by mouth. 90 tablet 1  . QUDEXY XR 150 MG CS24 take 1 capsule by mouth daily at bedtime  11  . rizatriptan (MAXALT) 5 MG tablet Take 5 mg by mouth as needed for migraine. May repeat in 2 hours if needed    . traZODone (DESYREL) 150 MG tablet Please dispense 50mg  - 3 pills if possible , due to difficulty swallowing 270 tablet 1   No current facility-administered medications for this visit.     Medical Decision Making:  Established Problem, Stable/Improving (1), Review of Medication Regimen & Side Effects (2) and Review of New Medication or Change in Dosage (2)  Treatment Plan Summary:Medication management and Plan plan  Continue Abilify 5 mg by mouth daily. Continue mirtazapine 30 mg by mouth daily at bedtime Continue trazodone 150 mg by mouth daily at bedtime- dispensed as 50mg  x 3 pills at pt request.   Follow-up in 3 months or earlier depending on his symptoms   More than 50%  of the time spent in psychoeducation, counseling and coordination of care.    This note was generated in part or whole with voice recognition software. Voice regonition is usually quite accurate but there are transcription errors that can and very often do occur. I apologize for any typographical errors that were not detected and corrected.   Rainey Pines, MD  04/11/2017, 2:39 PM

## 2017-04-12 DIAGNOSIS — I671 Cerebral aneurysm, nonruptured: Secondary | ICD-10-CM | POA: Diagnosis not present

## 2017-04-12 DIAGNOSIS — G44229 Chronic tension-type headache, not intractable: Secondary | ICD-10-CM | POA: Diagnosis not present

## 2017-04-12 DIAGNOSIS — G40309 Generalized idiopathic epilepsy and epileptic syndromes, not intractable, without status epilepticus: Secondary | ICD-10-CM | POA: Diagnosis not present

## 2017-04-15 IMAGING — DX DG HAND COMPLETE 3+V*L*
3 series · 3 of 3 positions shown · non-contrast
Comparison: None.

CLINICAL DATA: Car door trauma to left first digit.

EXAM:
LEFT HAND - COMPLETE 3+ VIEW

[hand ap]
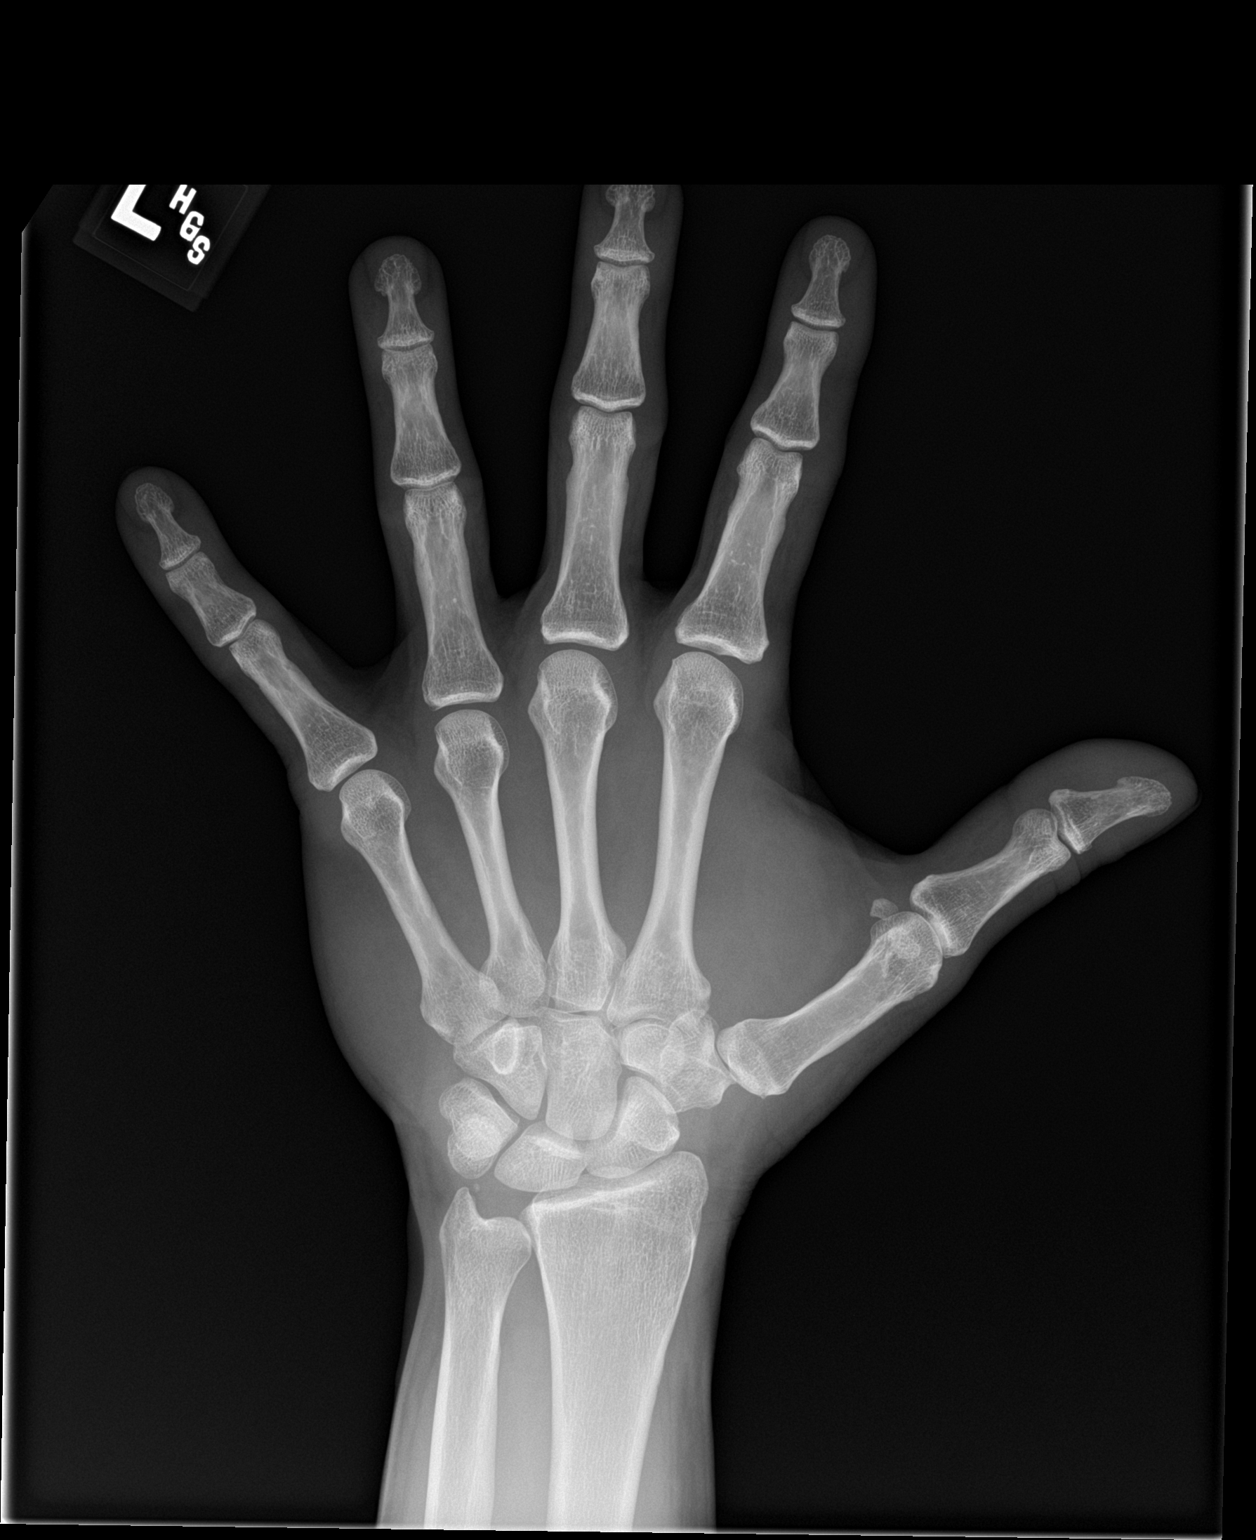

[hand obl]
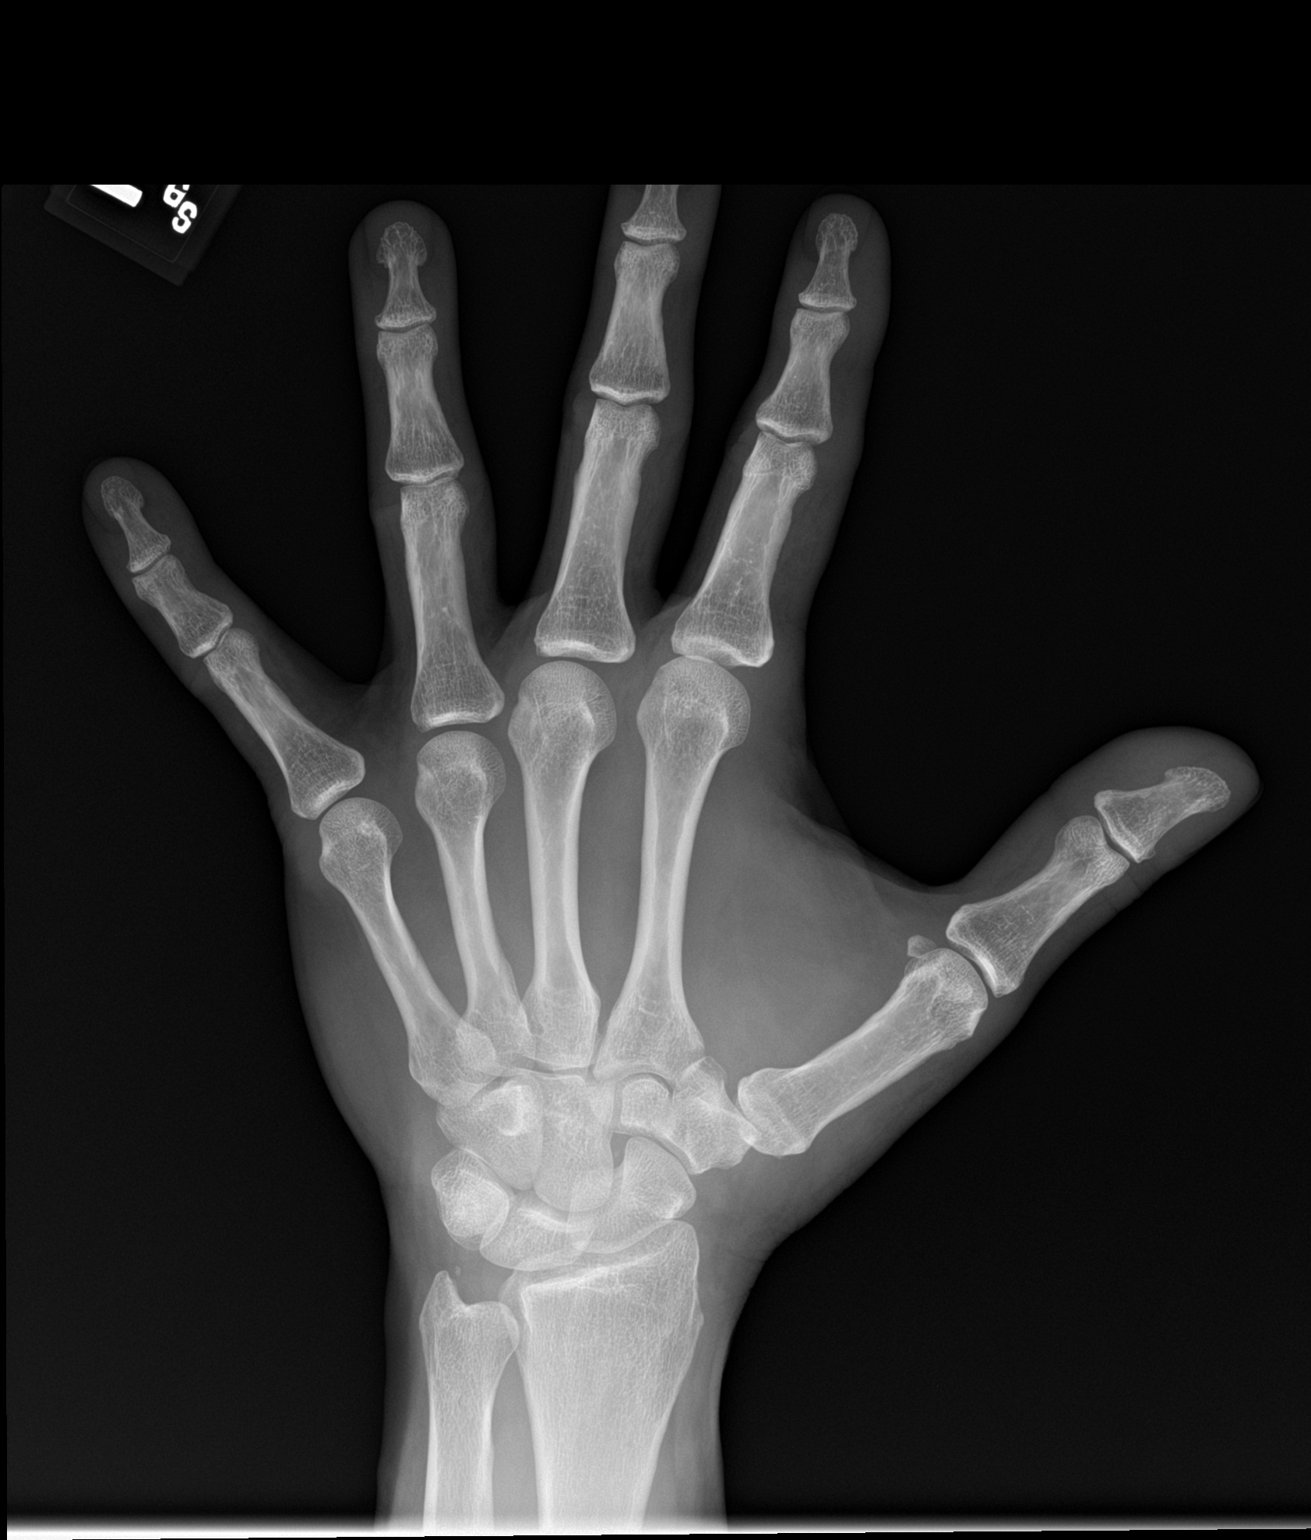

[hand lat]
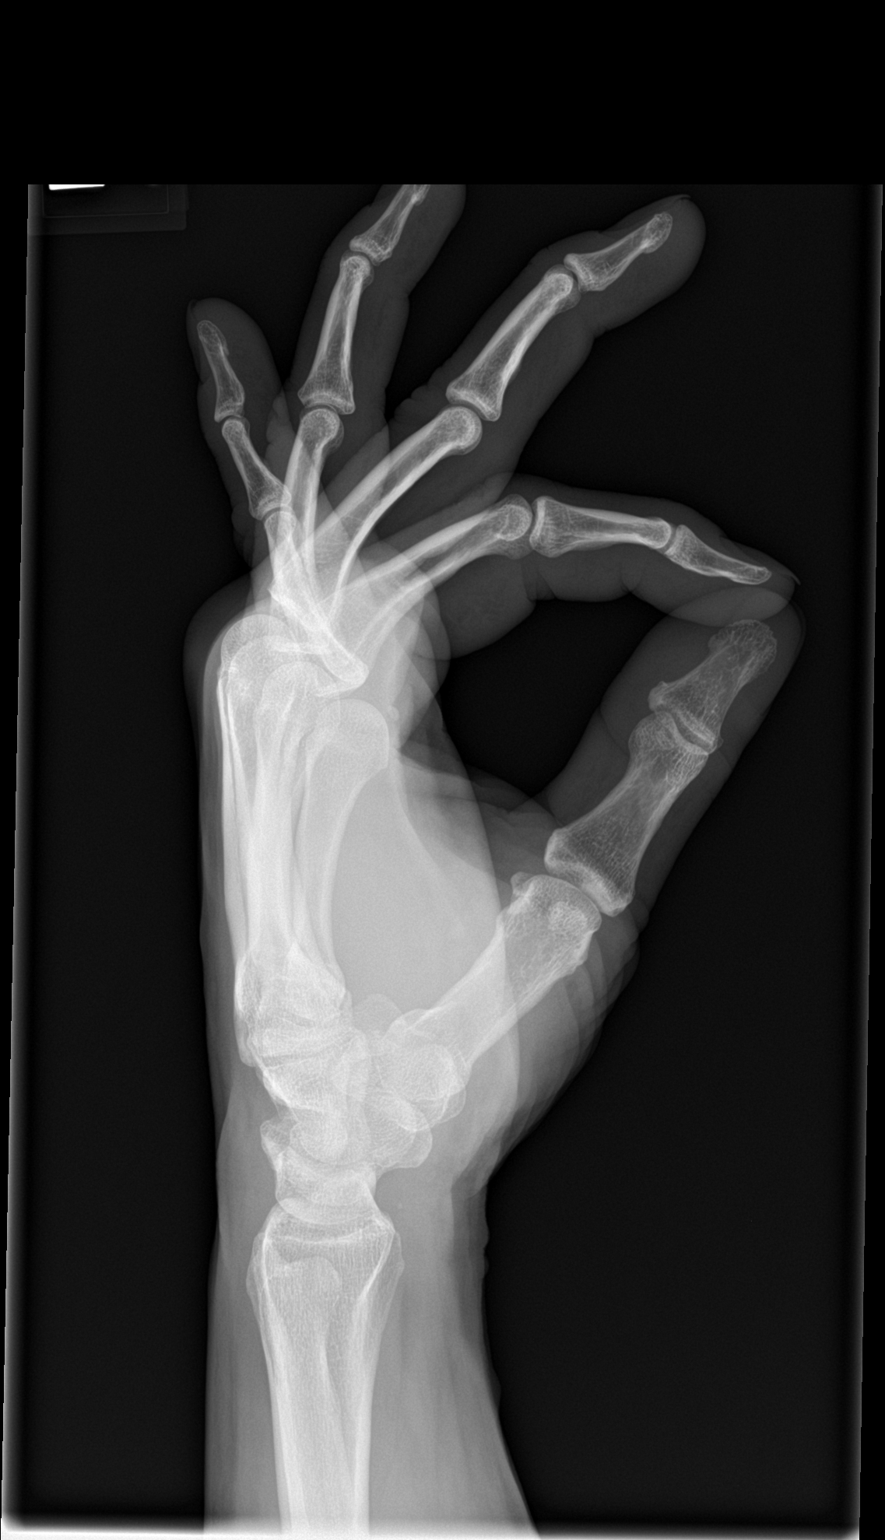

[3 of 3 positions shown; findings below may reference images not displayed]

FINDINGS: There is no evidence of fracture or dislocation. There is no
evidence of arthropathy or other focal bone abnormality. Soft
tissues are unremarkable.
IMPRESSION: Negative.

## 2017-05-19 DIAGNOSIS — I671 Cerebral aneurysm, nonruptured: Secondary | ICD-10-CM | POA: Diagnosis not present

## 2017-05-19 DIAGNOSIS — Z9889 Other specified postprocedural states: Secondary | ICD-10-CM | POA: Diagnosis not present

## 2017-05-19 DIAGNOSIS — Z8679 Personal history of other diseases of the circulatory system: Secondary | ICD-10-CM | POA: Diagnosis not present

## 2017-05-19 DIAGNOSIS — G9389 Other specified disorders of brain: Secondary | ICD-10-CM | POA: Diagnosis not present

## 2017-05-19 DIAGNOSIS — Z79899 Other long term (current) drug therapy: Secondary | ICD-10-CM | POA: Diagnosis not present

## 2017-05-19 DIAGNOSIS — Z09 Encounter for follow-up examination after completed treatment for conditions other than malignant neoplasm: Secondary | ICD-10-CM | POA: Diagnosis not present

## 2017-07-11 ENCOUNTER — Ambulatory Visit (INDEPENDENT_AMBULATORY_CARE_PROVIDER_SITE_OTHER): Payer: Medicare Other | Admitting: Psychiatry

## 2017-07-11 ENCOUNTER — Encounter: Payer: Self-pay | Admitting: Psychiatry

## 2017-07-11 ENCOUNTER — Other Ambulatory Visit: Payer: Self-pay

## 2017-07-11 VITALS — BP 130/80 | HR 69 | Temp 98.3°F | Wt 211.8 lb

## 2017-07-11 DIAGNOSIS — F39 Unspecified mood [affective] disorder: Secondary | ICD-10-CM | POA: Diagnosis not present

## 2017-07-11 DIAGNOSIS — F5105 Insomnia due to other mental disorder: Secondary | ICD-10-CM | POA: Diagnosis not present

## 2017-07-11 MED ORDER — MIRTAZAPINE 30 MG PO TABS: 30.0000 mg | ORAL_TABLET | Freq: Every day | ORAL | 1 refills | Status: DC

## 2017-07-11 MED ORDER — LAMOTRIGINE 150 MG PO TABS: 150.0000 mg | ORAL_TABLET | Freq: Two times a day (BID) | ORAL | 2 refills | Status: DC

## 2017-07-11 MED ORDER — ABILIFY 5 MG PO TABS: 5.0000 mg | ORAL_TABLET | Freq: Every day | ORAL | 2 refills | Status: DC

## 2017-07-11 MED ORDER — TRAZODONE HCL 150 MG PO TABS: 150.0000 mg | ORAL_TABLET | Freq: Every day | ORAL | 1 refills | Status: DC

## 2017-07-11 NOTE — Progress Notes (Signed)
BH MD/PA/NP OP Progress Note  07/11/2017 1:22 PM Dustin Paul  MRN:  [id number]  Subjective:  Patient is a 57 year old male with history of mood disorder presented for follow-up.  He reported that he is doing well since he got married in December. He reported that he has renovated his house and has been busy around the house. Now he is going for his late honeymoon to [place] next week with his wife. He reported that he has good relationship with her. Patient has also changed his pharmacies and is getting good deal with the new pharmacy. He reported that [facility] was very expensive. He reported that the current combination of medications is helping him. He sleeping well with the help of trazodone. He currently denied having any suicidal ideations or plans. He appeared calm and alert during the interview. He denied having any suicidal homicidal ideations or plans. We discussed about the cost of medications and he will take brand name  Abilify as it will cost him only $5 per month. He was given coupon for the same.     Chief Complaint: Chief Complaint    Follow-up; Medication Refill     Visit Diagnosis:     ICD-10-CM   1. Episodic mood disorder ([place]) F39   2. Insomnia due to mental condition F51.05     Past Medical History:  Past Medical History:  Diagnosis Date  . Aneurysm of artery ([place]) 2011   LEFT (19 MM) AND RIGHT (11 MM) middle cerebral  . Chronic kidney disease    H/O STONES  . Complication of anesthesia   . Headache    MIGRAINES  . Hypertension   . Insomnia   . Lumbar stress fracture   . PONV (postoperative nausea and vomiting)    AFTER KNEE SURGERY IN 1980'S  . Seasonal allergies   . Seizures ([place])    last seizure in 2015  . Visual hallucinations     Past Surgical History:  Procedure Laterality Date  . BRAIN SURGERY  2011   2 ANEURYSMS-TOTAL OF 9 CLIPS IN BRAIN FROM ANEURYSM  . COLONOSCOPY WITH PROPOFOL N/A 01/16/2015   Procedure: COLONOSCOPY WITH PROPOFOL;  Surgeon:  Hulen Luster, MD;  Location: [facility] ENDOSCOPY;  Service: Gastroenterology;  Laterality: N/A;  . KNEE ARTHROSCOPY WITH MEDIAL MENISECTOMY Left 11/20/2015   Procedure: KNEE ARTHROSCOPY WITH partial MEDIAL MENISECTOMY / WITH REMOVAL OF LOOSE BODY;  Surgeon: Hessie Knows, MD;  Location: [facility] ORS;  Service: Orthopedics;  Laterality: Left;  . KNEE SURGERY Left    Family History: History reviewed. No pertinent family history. Social History:  Social History   Socioeconomic History  . Marital status: Widowed    Spouse name: None  . Number of children: None  . Years of education: None  . Highest education level: None  Social Needs  . Financial resource strain: None  . Food insecurity - worry: None  . Food insecurity - inability: None  . Transportation needs - medical: None  . Transportation needs - non-medical: None  Occupational History  . None  Tobacco Use  . Smoking status: Never Smoker  . Smokeless tobacco: Never Used  Substance and Sexual Activity  . Alcohol use: No    Alcohol/week: 0.0 oz  . Drug use: No  . Sexual activity: Not Currently  Other Topics Concern  . None  Social History Narrative  . None   Additional History:   Assessment:   Musculoskeletal: Strength & Muscle Tone: within normal limits Gait & Station: normal  Patient leans: N/A  Psychiatric Specialty Exam: Medication Refill     ROS  Blood pressure 130/80, pulse 69, temperature 98.3 F (36.8 C), temperature source Oral, weight 211 lb 12.8 oz (96.1 kg).Body mass index is 28.73 kg/m.  General Appearance: Well Groomed  Eye Contact:  Good  Speech:  Normal Rate  Volume:  Normal  Mood:  Okay  Affect:  Congruent  Thought Process:  Linear and Logical  Orientation:  Full (Time, Place, and Person)  Thought Content:  Negative  Suicidal Thoughts:  No  Homicidal Thoughts:  No  Memory:  Immediate;   Good Recent;   Good Remote;   Good  Judgement:  Good  Insight:  Good  Psychomotor Activity:  Negative   Concentration:  Good  Recall:  Good  Fund of Knowledge: Good  Language: Good  Akathisia:  Negative  Handed:    AIMS (if indicated):  Done 05/15/2015 and normal   Assets:  Communication Skills Desire for Improvement Social Support  ADL's:  Intact  Cognition: Impaired,  Moderate  Sleep:  Poor, he relates over past week he may wake up between the hours of 3 and 7    Is the patient at risk to self?  No. Has the patient been a risk to self in the past 6 months?  No. Has the patient been a risk to self within the distant past?  No. Is the patient a risk to others?  No. Has the patient been a risk to others in the past 6 months?  No. Has the patient been a risk to others within the distant past?  No.  Current Medications: Current Outpatient Medications  Medication Sig Dispense Refill  . ABILIFY 5 MG tablet Take 1 tablet (5 mg total) by mouth daily. 90 tablet 2  . Alpha-Lipoic Acid 300 MG CAPS Take 300 mg by mouth every evening. Reported on 09/24/2015    . cyclobenzaprine (FLEXERIL) 10 MG tablet Take 10 mg by mouth 3 (three) times daily as needed. Reported on 05/15/2015    . lamoTRIgine (LAMICTAL) 150 MG tablet Take 1 tablet (150 mg total) by mouth 2 (two) times daily. 180 tablet 2  . lisinopril (PRINIVIL,ZESTRIL) 5 MG tablet TAKE 1 TABLET BY MOUTH DAILY 90 tablet 3  . Melatonin 10 MG CAPS Take 1 capsule by mouth at bedtime.    . mirtazapine (REMERON) 30 MG tablet Take 1 tablet (30 mg total) by mouth at bedtime. 90 tablet 1  . QUDEXY XR 150 MG CS24 take 1 capsule by mouth daily at bedtime  11  . rizatriptan (MAXALT) 5 MG tablet Take 5 mg by mouth as needed for migraine. May repeat in 2 hours if needed    . traZODone (DESYREL) 150 MG tablet Take 1 tablet (150 mg total) by mouth at bedtime. 90 tablet 1   No current facility-administered medications for this visit.     Medical Decision Making:  Established Problem, Stable/Improving (1), Review of Medication Regimen & Side Effects (2) and  Review of New Medication or Change in Dosage (2)  Treatment Plan Summary:Medication management and Plan plan  Continue Abilify 5 mg by mouth daily. Continue mirtazapine 30 mg by mouth daily at bedtime Continue trazodone 150 mg by mouth daily at bedtime- dispensed as 50mg  x 3 pills at pt request.   Follow-up in 3 months or earlier depending on his symptoms   More than 50% of the time spent in psychoeducation, counseling and coordination of care.    This note  was generated in part or whole with voice recognition software. Voice regonition is usually quite accurate but there are transcription errors that can and very often do occur. I apologize for any typographical errors that were not detected and corrected.   Rainey Pines, MD  07/11/2017, 1:22 PM

## 2017-07-14 ENCOUNTER — Telehealth: Payer: Self-pay

## 2017-07-14 NOTE — Telephone Encounter (Signed)
Pt pharmacy sent a refill request of sildenafil. Can he have them?

## 2017-07-15 MED ORDER — SILDENAFIL CITRATE 20 MG PO TABS: ORAL_TABLET | ORAL | 0 refills | Status: DC

## 2017-07-15 NOTE — Telephone Encounter (Signed)
[facility]  He will be due for annual follow-up June 2019

## 2017-08-03 ENCOUNTER — Telehealth: Payer: Self-pay | Admitting: Family Medicine

## 2017-08-16 ENCOUNTER — Ambulatory Visit (INDEPENDENT_AMBULATORY_CARE_PROVIDER_SITE_OTHER): Payer: Medicare Other

## 2017-08-16 VITALS — BP 132/80 | HR 72 | Temp 99.4°F | Ht 72.0 in

## 2017-08-16 DIAGNOSIS — Z0000 Encounter for general adult medical examination without abnormal findings: Secondary | ICD-10-CM | POA: Diagnosis not present

## 2017-08-16 NOTE — Patient Instructions (Signed)
Dustin Paul , Thank you for taking time to come for your Medicare Wellness Visit. I appreciate your ongoing commitment to your health goals. Please review the following plan we discussed and let me know if I can assist you in the future.   Screening recommendations/referrals: Colonoscopy: Up to date Recommended yearly ophthalmology/optometry visit for glaucoma screening and checkup Recommended yearly dental visit for hygiene and checkup  Vaccinations: Influenza vaccine: Pt declines today.  Pneumococcal vaccine: N/A Tdap vaccine: Pt declines today.  Shingles vaccine: N/A    Advanced directives: Please bring a copy of your POA (Power of Attorney) and/or Living Will to your next appointment.   Conditions/risks identified: None.   Next appointment: 08/22/18 with [facility]. Encouraged pt to schedule physical with PCP.   Preventive Care 40-64 Years, Male Preventive care refers to lifestyle choices and visits with your health care provider that can promote health and wellness. What does preventive care include?  A yearly physical exam. This is also called an annual well check.  Dental exams once or twice a year.  Routine eye exams. Ask your health care provider how often you should have your eyes checked.  Personal lifestyle choices, including:  Daily care of your teeth and gums.  Regular physical activity.  Eating a healthy diet.  Avoiding tobacco and drug use.  Limiting alcohol use.  Practicing safe sex.  Taking low-dose aspirin every day starting at age 62. What happens during an annual well check? The services and screenings done by your health care provider during your annual well check will depend on your age, overall health, lifestyle risk factors, and family history of disease. Counseling  Your health care provider may ask you questions about your:  Alcohol use.  Tobacco use.  Drug use.  Emotional well-being.  Home and relationship well-being.  Sexual  activity.  Eating habits.  Work and work Statistician. Screening  You may have the following tests or measurements:  Height, weight, and BMI.  Blood pressure.  Lipid and cholesterol levels. These may be checked every 5 years, or more frequently if you are over 11 years old.  Skin check.  Lung cancer screening. You may have this screening every year starting at age 74 if you have a 30-pack-year history of smoking and currently smoke or have quit within the past 15 years.  Fecal occult blood test (FOBT) of the stool. You may have this test every year starting at age 64.  Flexible sigmoidoscopy or colonoscopy. You may have a sigmoidoscopy every 5 years or a colonoscopy every 10 years starting at age 40.  Prostate cancer screening. Recommendations will vary depending on your family history and other risks.  Hepatitis C blood test.  Hepatitis B blood test.  Sexually transmitted disease (STD) testing.  Diabetes screening. This is done by checking your blood sugar (glucose) after you have not eaten for a while (fasting). You may have this done every 1-3 years. Discuss your test results, treatment options, and if necessary, the need for more tests with your health care provider. Vaccines  Your health care provider may recommend certain vaccines, such as:  Influenza vaccine. This is recommended every year.  Tetanus, diphtheria, and acellular pertussis (Tdap, Td) vaccine. You may need a Td booster every 10 years.  Zoster vaccine. You may need this after age 90.  Pneumococcal 13-valent conjugate (PCV13) vaccine. You may need this if you have certain conditions and have not been vaccinated.  Pneumococcal polysaccharide (PPSV23) vaccine. You may need one or two  doses if you smoke cigarettes or if you have certain conditions. Talk to your health care provider about which screenings and vaccines you need and how often you need them. This information is not intended to replace advice  given to you by your health care provider. Make sure you discuss any questions you have with your health care provider. Document Released: 06/13/2015 Document Revised: 02/04/2016 Document Reviewed: 03/18/2015 Elsevier Interactive Patient Education  2017 [place] Prevention in the Home Falls can cause injuries. They can happen to people of all ages. There are many things you can do to make your home safe and to help prevent falls. What can I do on the outside of my home?  Regularly fix the edges of walkways and driveways and fix any cracks.  Remove anything that might make you trip as you walk through a door, such as a raised step or threshold.  Trim any bushes or trees on the path to your home.  Use bright outdoor lighting.  Clear any walking paths of anything that might make someone trip, such as rocks or tools.  Regularly check to see if handrails are loose or broken. Make sure that both sides of any steps have handrails.  Any raised decks and porches should have guardrails on the edges.  Have any leaves, snow, or ice cleared regularly.  Use sand or salt on walking paths during winter.  Clean up any spills in your garage right away. This includes oil or grease spills. What can I do in the bathroom?  Use night lights.  Install grab bars by the toilet and in the tub and shower. Do not use towel bars as grab bars.  Use non-skid mats or decals in the tub or shower.  If you need to sit down in the shower, use a plastic, non-slip stool.  Keep the floor dry. Clean up any water that spills on the floor as soon as it happens.  Remove soap buildup in the tub or shower regularly.  Attach bath mats securely with double-sided non-slip rug tape.  Do not have throw rugs and other things on the floor that can make you trip. What can I do in the bedroom?  Use night lights.  Make sure that you have a light by your bed that is easy to reach.  Do not use any sheets or  blankets that are too big for your bed. They should not hang down onto the floor.  Have a firm chair that has side arms. You can use this for support while you get dressed.  Do not have throw rugs and other things on the floor that can make you trip. What can I do in the kitchen?  Clean up any spills right away.  Avoid walking on wet floors.  Keep items that you use a lot in easy-to-reach places.  If you need to reach something above you, use a strong step stool that has a grab bar.  Keep electrical cords out of the way.  Do not use floor polish or wax that makes floors slippery. If you must use wax, use non-skid floor wax.  Do not have throw rugs and other things on the floor that can make you trip. What can I do with my stairs?  Do not leave any items on the stairs.  Make sure that there are handrails on both sides of the stairs and use them. Fix handrails that are broken or loose. Make sure that handrails are as  long as the stairways.  Check any carpeting to make sure that it is firmly attached to the stairs. Fix any carpet that is loose or worn.  Avoid having throw rugs at the top or bottom of the stairs. If you do have throw rugs, attach them to the floor with carpet tape.  Make sure that you have a light switch at the top of the stairs and the bottom of the stairs. If you do not have them, ask someone to add them for you. What else can I do to help prevent falls?  Wear shoes that:  Do not have high heels.  Have rubber bottoms.  Are comfortable and fit you well.  Are closed at the toe. Do not wear sandals.  If you use a stepladder:  Make sure that it is fully opened. Do not climb a closed stepladder.  Make sure that both sides of the stepladder are locked into place.  Ask someone to hold it for you, if possible.  Clearly mark and make sure that you can see:  Any grab bars or handrails.  First and last steps.  Where the edge of each step is.  Use tools  that help you move around (mobility aids) if they are needed. These include:  Canes.  Walkers.  Scooters.  Crutches.  Turn on the lights when you go into a dark area. Replace any light bulbs as soon as they burn out.  Set up your furniture so you have a clear path. Avoid moving your furniture around.  If any of your floors are uneven, fix them.  If there are any pets around you, be aware of where they are.  Review your medicines with your doctor. Some medicines can make you feel dizzy. This can increase your chance of falling. Ask your doctor what other things that you can do to help prevent falls. This information is not intended to replace advice given to you by your health care provider. Make sure you discuss any questions you have with your health care provider. Document Released: 03/13/2009 Document Revised: 10/23/2015 Document Reviewed: 06/21/2014 Elsevier Interactive Patient Education  2017 [employer].

## 2017-08-16 NOTE — Progress Notes (Signed)
Subjective:   Dustin Paul is a 57 y.o. male who presents for an Initial Medicare Annual Wellness Visit.  Review of Systems  N/A  Cardiac Risk Factors include: advanced age (>56men, >60 women);male gender;hypertension    Objective:    Today's Vitals   08/16/17 1042  BP: 132/80  Pulse: 72  Temp: 99.4 F (37.4 C)  TempSrc: Oral  Height: 6' (1.829 m)  PainSc: 0-No pain   Body mass index is 28.73 kg/m.  Advanced Directives 08/16/2017 07/11/2016 11/20/2015  Does Patient Have a Medical Advance Directive? Yes No Yes  Type of Advance Directive [place] will  [place] in Chart? No - copy requested - No - copy requested  Some encounter information is confidential and restricted. Go to Review Flowsheets activity to see all data.    Current Medications (verified) Outpatient Encounter Medications as of 08/16/2017  Medication Sig  . ABILIFY 5 MG tablet Take 1 tablet (5 mg total) by mouth daily.  . cyclobenzaprine (FLEXERIL) 10 MG tablet Take 10 mg by mouth 3 (three) times daily as needed. Reported on 05/15/2015  . lamoTRIgine (LAMICTAL) 150 MG tablet Take 1 tablet (150 mg total) by mouth 2 (two) times daily.  Marland Kitchen lisinopril (PRINIVIL,ZESTRIL) 5 MG tablet TAKE 1 TABLET BY MOUTH DAILY  . Melatonin 10 MG CAPS Take 1 capsule by mouth at bedtime.  . mirtazapine (REMERON) 30 MG tablet Take 1 tablet (30 mg total) by mouth at bedtime.  . QUDEXY XR 150 MG CS24 take 1 capsule by mouth daily at bedtime  . rizatriptan (MAXALT) 5 MG tablet Take 5 mg by mouth as needed for migraine. May repeat in 2 hours if needed  . sildenafil (REVATIO) 20 MG tablet 2-5 tabs 1 hour prior to intercourse  . traZODone (DESYREL) 150 MG tablet Take 1 tablet (150 mg total) by mouth at bedtime.  . Alpha-Lipoic Acid 300 MG CAPS Take 300 mg by mouth every evening. Reported on 09/24/2015   No facility-administered encounter medications on file as of 08/16/2017.      Allergies (verified) Aspirin and Levetiracetam   History: Past Medical History:  Diagnosis Date  . Aneurysm of artery ([place]) 2011   LEFT (19 MM) AND RIGHT (11 MM) middle cerebral  . Chronic kidney disease    H/O STONES  . Complication of anesthesia   . Headache    MIGRAINES  . Hypertension   . Insomnia   . Lumbar stress fracture   . PONV (postoperative nausea and vomiting)    AFTER KNEE SURGERY IN 1980'S  . Seasonal allergies   . Seizures ([place])    last seizure in 2015  . Visual hallucinations    Past Surgical History:  Procedure Laterality Date  . BRAIN SURGERY  2011   2 ANEURYSMS-TOTAL OF 9 CLIPS IN BRAIN FROM ANEURYSM  . COLONOSCOPY WITH PROPOFOL N/A 01/16/2015   Procedure: COLONOSCOPY WITH PROPOFOL;  Surgeon: Hulen Luster, MD;  Location: [facility] ENDOSCOPY;  Service: Gastroenterology;  Laterality: N/A;  . KNEE ARTHROSCOPY WITH MEDIAL MENISECTOMY Left 11/20/2015   Procedure: KNEE ARTHROSCOPY WITH partial MEDIAL MENISECTOMY / WITH REMOVAL OF LOOSE BODY;  Surgeon: Hessie Knows, MD;  Location: [facility] ORS;  Service: Orthopedics;  Laterality: Left;  . KNEE SURGERY Left    History reviewed. No pertinent family history. Social History   Socioeconomic History  . Marital status: Married    Spouse name: None  . Number of children: 2  .  Years of education: None  . Highest education level: Bachelor's degree (e.g., BA, AB, BS)  Social Needs  . Financial resource strain: Not hard at all  . Food insecurity - worry: Never true  . Food insecurity - inability: Never true  . Transportation needs - medical: No  . Transportation needs - non-medical: No  Occupational History  . Occupation: engineer/retired  Tobacco Use  . Smoking status: Never Smoker  . Smokeless tobacco: Never Used  Substance and Sexual Activity  . Alcohol use: No    Alcohol/week: 0.0 oz    Comment: 1-2 drinks a month  . Drug use: No  . Sexual activity: Not Currently  Other Topics Concern  . None  Social History  Narrative  . None   Tobacco Counseling Counseling given: Not Answered   Clinical Intake:  Pre-visit preparation completed: Yes  Pain : No/denies pain Pain Score: 0-No pain     Nutritional Status: BMI 25 -29 Overweight Nutritional Risks: None Diabetes: No  How often do you need to have someone help you when you read instructions, pamphlets, or other written materials from your doctor or pharmacy?: 1 - Never  Interpreter Needed?: No  Information entered by :: [facility], LPN  Activities of Daily Living In your present state of health, do you have any difficulty performing the following activities: 08/16/2017  Hearing? N  Vision? N  Difficulty concentrating or making decisions? N  Walking or climbing stairs? N  Dressing or bathing? N  Doing errands, shopping? N  Preparing Food and eating ? N  Using the Toilet? N  In the past six months, have you accidently leaked urine? N  Do you have problems with loss of bowel control? N  Managing your Medications? N  Managing your Finances? N  Housekeeping or managing your Housekeeping? N  Some recent data might be hidden     Immunizations and Health Maintenance Immunization History  Administered Date(s) Administered  . Td 06/01/1995  . Tdap 07/12/2007   Health Maintenance Due  Topic Date Due  . Hepatitis C Screening  1960-12-29  . HIV Screening  02/09/1976  . TETANUS/TDAP  07/11/2017    Patient Care Team: Birdie Sons, MD as PCP - General (Family Medicine) Agapito Games as Consulting Physician (Optometry) Lavona Mound, Gerri Lins, MD as Referring Physician (Psychiatry) Wyvonne Lenz, DO as Referring Physician (Neurology) Rainey Pines, MD as Consulting Physician (Psychiatry)  Indicate any recent Medical Services you may have received from other than [facility] providers in the past year (date may be approximate).    Assessment:   This is a routine wellness examination for Dustin Paul.  Hearing/Vision screen Vision  Screening Comments: Pt sees Dr Marvel Plan once yearly for vision checks.   Dietary issues and exercise activities discussed: Current Exercise Habits: Home exercise routine(total gym), Type of exercise: walking;strength training/weights, Time (Minutes): 45, Frequency (Times/Week): 5, Weekly Exercise (Minutes/Week): 225, Intensity: Mild  Goals    None     Depression Screen PHQ 2/9 Scores 08/16/2017 08/10/2016  PHQ - 2 Score 0 0  PHQ- 9 Score - 2    Fall Risk Fall Risk  08/16/2017 08/10/2016  Falls in the past year? No No    Is the patient's home free of loose throw rugs in walkways, pet beds, electrical cords, etc?   yes      Grab bars in the bathroom? no      Handrails on the stairs?   yes      Adequate lighting?   yes  Timed Get Up and Go performed: N/A  Cognitive Function:     6CIT Screen 08/16/2017  What Year? 0 points  What month? 0 points  What time? 0 points  Count back from 20 0 points  Months in reverse 0 points  Repeat phrase 0 points  Total Score 0    Screening Tests Health Maintenance  Topic Date Due  . Hepatitis C Screening  1961/01/28  . HIV Screening  02/09/1976  . TETANUS/TDAP  07/11/2017  . INFLUENZA VACCINE  08/28/2017 (Originally 12/29/2016)  . COLONOSCOPY  01/16/2020    Qualifies for Shingles Vaccine? N/A  Cancer Screenings: Lung: Low Dose CT Chest recommended if Age 73-80 years, 30 pack-year currently smoking OR have quit w/in 15years. Patient does not qualify. Colorectal: Up to date  Additional Screenings:  Hepatitis B/HIV/Syphillis: Pt declines today.  Hepatitis C Screening: Pt declines today.      Plan:  I have personally reviewed and addressed the Medicare Annual Wellness questionnaire and have noted the following in the patient's chart:  A. Medical and social history B. Use of alcohol, tobacco or illicit drugs  C. Current medications and supplements D. Functional ability and status E.  Nutritional status F.  Physical  activity G. Advance directives H. List of other physicians I.  Hospitalizations, surgeries, and ER visits in previous 12 months J.  [place] such as hearing and vision if needed, cognitive and depression L. Referrals and appointments - none  In addition, I have reviewed and discussed with patient certain preventive protocols, quality metrics, and best practice recommendations. A written personalized care plan for preventive services as well as general preventive health recommendations were provided to patient.  See attached scanned questionnaire for additional information.   Signed,  Fabio Neighbors, LPN Nurse Health Advisor   Nurse Recommendations: Pt declined the tetanus vaccine, HIV and Hep C lab screenings today.

## 2017-09-01 NOTE — Telephone Encounter (Signed)
complete

## 2017-09-06 ENCOUNTER — Other Ambulatory Visit: Payer: Self-pay

## 2017-09-06 MED ORDER — SILDENAFIL CITRATE 20 MG PO TABS: ORAL_TABLET | ORAL | 2 refills | Status: DC

## 2017-10-10 ENCOUNTER — Ambulatory Visit: Payer: Medicare Other | Admitting: Psychiatry

## 2018-01-02 ENCOUNTER — Other Ambulatory Visit: Payer: Self-pay | Admitting: Psychiatry

## 2018-01-09 ENCOUNTER — Encounter: Payer: Self-pay | Admitting: Psychiatry

## 2018-01-09 ENCOUNTER — Ambulatory Visit (INDEPENDENT_AMBULATORY_CARE_PROVIDER_SITE_OTHER): Payer: 59 | Admitting: Psychiatry

## 2018-01-09 DIAGNOSIS — F5105 Insomnia due to other mental disorder: Secondary | ICD-10-CM | POA: Diagnosis not present

## 2018-01-09 DIAGNOSIS — F39 Unspecified mood [affective] disorder: Secondary | ICD-10-CM | POA: Diagnosis not present

## 2018-01-09 MED ORDER — ABILIFY 5 MG PO TABS: 5.0000 mg | ORAL_TABLET | Freq: Every day | ORAL | 2 refills | Status: DC

## 2018-01-09 MED ORDER — TRAZODONE HCL 150 MG PO TABS: 150.0000 mg | ORAL_TABLET | Freq: Every day | ORAL | 1 refills | Status: DC

## 2018-01-09 MED ORDER — MIRTAZAPINE 30 MG PO TABS: 30.0000 mg | ORAL_TABLET | Freq: Every day | ORAL | 1 refills | Status: DC

## 2018-01-09 NOTE — Progress Notes (Signed)
[facility] MD/PA/NP OP Progress Note  01/09/2018 3:54 PM Dustin Paul  MRN:  [id number]  Subjective:  Patient is a 57 year old male with history of mood disorder presented for follow-up.  He reported that he has a started working since April.  He reported that he works in [employer] now.  He stated that he has been working hard since he did not get any training at his job.  Patient reported that he is doing well with his medication.  His wife also started a full-time job and she has also started cooking at home.  Patient reported that he has no acute symptoms and he feels that his current medications have been helpful.  He appears calm and alert during the interview.  We discussed about medication compliance and he agreed with the plan.  He reported that he sleeps well at night and they both go to bed early as they have to wake up in the morning to go to work.  He has a 30-minute drive to [place].  He was receptive to his medications.  He does not have any acute symptoms at this time.     Patient has been taking brand name Abilify at this time.   Chief Complaint:  Visit Diagnosis:     ICD-10-CM   1. Episodic mood disorder ([place]) F39   2. Insomnia due to mental condition F51.05     Past Medical History:  Past Medical History:  Diagnosis Date  . Aneurysm of artery ([place]) 2011   LEFT (19 MM) AND RIGHT (11 MM) middle cerebral  . Chronic kidney disease    H/O STONES  . Complication of anesthesia   . Headache    MIGRAINES  . Hypertension   . Insomnia   . Lumbar stress fracture   . PONV (postoperative nausea and vomiting)    AFTER KNEE SURGERY IN 1980'S  . Seasonal allergies   . Seizures ([place])    last seizure in 2015  . Visual hallucinations     Past Surgical History:  Procedure Laterality Date  . BRAIN SURGERY  2011   2 ANEURYSMS-TOTAL OF 9 CLIPS IN BRAIN FROM ANEURYSM  . COLONOSCOPY WITH PROPOFOL N/A 01/16/2015   Procedure: COLONOSCOPY WITH PROPOFOL;  Surgeon: Hulen Luster, MD;   Location: [facility] ENDOSCOPY;  Service: Gastroenterology;  Laterality: N/A;  . KNEE ARTHROSCOPY WITH MEDIAL MENISECTOMY Left 11/20/2015   Procedure: KNEE ARTHROSCOPY WITH partial MEDIAL MENISECTOMY / WITH REMOVAL OF LOOSE BODY;  Surgeon: Hessie Knows, MD;  Location: [facility] ORS;  Service: Orthopedics;  Laterality: Left;  . KNEE SURGERY Left    Family History: No family history on file. Social History:  Social History   Socioeconomic History  . Marital status: Married    Spouse name: Not on file  . Number of children: 2  . Years of education: Not on file  . Highest education level: Bachelor's degree (e.g., BA, AB, BS)  Occupational History  . Occupation: engineer/retired  Social Needs  . Financial resource strain: Not hard at all  . Food insecurity:    Worry: Never true    Inability: Never true  . Transportation needs:    Medical: No    Non-medical: No  Tobacco Use  . Smoking status: Never Smoker  . Smokeless tobacco: Never Used  Substance and Sexual Activity  . Alcohol use: No    Alcohol/week: 0.0 standard drinks    Comment: 1-2 drinks a month  . Drug use: No  . Sexual activity: Not Currently  Lifestyle  . Physical activity:    Days per week: Not on file    Minutes per session: Not on file  . Stress: Not at all  Relationships  . Social connections:    Talks on phone: Not on file    Gets together: Not on file    Attends religious service: Not on file    Active member of club or organization: Not on file    Attends meetings of clubs or organizations: Not on file    Relationship status: Not on file  Other Topics Concern  . Not on file  Social History Narrative  . Not on file   Additional History:   Assessment:   Musculoskeletal: Strength & Muscle Tone: within normal limits Gait & Station: normal Patient leans: N/A  Psychiatric Specialty Exam: Medication Refill     ROS  There were no vitals taken for this visit.There is no height or weight on file to calculate  BMI.  General Appearance: Well Groomed  Eye Contact:  Good  Speech:  Normal Rate  Volume:  Normal  Mood:  Okay  Affect:  Congruent  Thought Process:  Linear and Logical  Orientation:  Full (Time, Place, and Person)  Thought Content:  Negative  Suicidal Thoughts:  No  Homicidal Thoughts:  No  Memory:  Immediate;   Good Recent;   Good Remote;   Good  Judgement:  Good  Insight:  Good  Psychomotor Activity:  Negative  Concentration:  Good  Recall:  Good  Fund of Knowledge: Good  Language: Good  Akathisia:  Negative  Handed:    AIMS (if indicated):  Done 05/15/2015 and normal   Assets:  Communication Skills Desire for Improvement Social Support  ADL's:  Intact  Cognition: Impaired,  Moderate  Sleep:  Poor, he relates over past week he may wake up between the hours of 3 and 7    Is the patient at risk to self?  No. Has the patient been a risk to self in the past 6 months?  No. Has the patient been a risk to self within the distant past?  No. Is the patient a risk to others?  No. Has the patient been a risk to others in the past 6 months?  No. Has the patient been a risk to others within the distant past?  No.  Current Medications: Current Outpatient Medications  Medication Sig Dispense Refill  . ABILIFY 5 MG tablet Take 1 tablet (5 mg total) by mouth daily. 90 tablet 2  . Alpha-Lipoic Acid 300 MG CAPS Take 300 mg by mouth every evening. Reported on 09/24/2015    . cyclobenzaprine (FLEXERIL) 10 MG tablet Take 10 mg by mouth 3 (three) times daily as needed. Reported on 05/15/2015    . lamoTRIgine (LAMICTAL) 150 MG tablet Take 1 tablet (150 mg total) by mouth 2 (two) times daily. 180 tablet 2  . lisinopril (PRINIVIL,ZESTRIL) 5 MG tablet TAKE 1 TABLET BY MOUTH DAILY 90 tablet 3  . Melatonin 10 MG CAPS Take 1 capsule by mouth at bedtime.    . mirtazapine (REMERON) 30 MG tablet Take 1 tablet (30 mg total) by mouth at bedtime. 90 tablet 1  . QUDEXY XR 150 MG CS24 take 1 capsule by  mouth daily at bedtime  11  . rizatriptan (MAXALT) 5 MG tablet Take 5 mg by mouth as needed for migraine. May repeat in 2 hours if needed    . sildenafil (REVATIO) 20 MG tablet 2-5 tabs 1  hour prior to intercourse 90 tablet 2  . traZODone (DESYREL) 150 MG tablet Take 1 tablet (150 mg total) by mouth at bedtime. 90 tablet 1   No current facility-administered medications for this visit.     Medical Decision Making:  Established Problem, Stable/Improving (1), Review of Medication Regimen & Side Effects (2) and Review of New Medication or Change in Dosage (2)  Treatment Plan Summary:Medication management and Plan plan  Continue Abilify 5 mg by mouth daily.  Brand name Abilify as it is cost effective Continue mirtazapine 30 mg by mouth daily at bedtime Continue trazodone 150 mg by mouth daily at bedtime- .   Follow-up in 3 months or earlier depending on his symptoms   More than 50% of the time spent in psychoeducation, counseling and coordination of care.    This note was generated in part or whole with voice recognition software. Voice regonition is usually quite accurate but there are transcription errors that can and very often do occur. I apologize for any typographical errors that were not detected and corrected.   Rainey Pines, MD  01/09/2018, 3:54 PM

## 2018-01-10 ENCOUNTER — Telehealth: Payer: Self-pay | Admitting: Urology

## 2018-01-10 NOTE — Telephone Encounter (Signed)
Pt made appt to be seen 9/20, however pt is out of Sildenafil 20mg  3-5 as needed.  Pt asks if he can have a refill sent before his appt, pt states it has been over 1 yr since last seen by Dr. Bernardo Heater. Please advise pt [phone]. Pharmacy is [facility] in [place].

## 2018-01-12 NOTE — Telephone Encounter (Signed)
Okay to fill? 

## 2018-01-12 NOTE — Telephone Encounter (Signed)
He has an appointment on 9/20.  Okay to give 30 tabs

## 2018-01-13 MED ORDER — SILDENAFIL CITRATE 20 MG PO TABS: ORAL_TABLET | ORAL | 0 refills | Status: DC

## 2018-01-13 NOTE — Addendum Note (Signed)
Addended by: Garnette Gunner on: 01/13/2018 03:18 PM   Modules accepted: Orders

## 2018-02-16 ENCOUNTER — Telehealth: Payer: Self-pay | Admitting: Urology

## 2018-02-16 NOTE — Telephone Encounter (Signed)
We are rescheduling his appointment due to you being in surgery and he is out of his Sildenafil, can he get a 90 days supply until he can get another app with you. I am going to try and get him in but it will probably be in October.  Please advise  [facility] [place]

## 2018-02-17 ENCOUNTER — Ambulatory Visit: Payer: Self-pay | Admitting: Urology

## 2018-02-19 MED ORDER — SILDENAFIL CITRATE 20 MG PO TABS: ORAL_TABLET | ORAL | 0 refills | Status: DC

## 2018-02-19 NOTE — Telephone Encounter (Signed)
Rx sent 

## 2018-03-17 ENCOUNTER — Ambulatory Visit: Payer: Managed Care, Other (non HMO) | Admitting: Urology

## 2018-03-17 ENCOUNTER — Encounter: Payer: Self-pay | Admitting: Urology

## 2018-03-17 VITALS — BP 165/112 | HR 80 | Ht 72.0 in | Wt 217.2 lb

## 2018-03-17 DIAGNOSIS — N200 Calculus of kidney: Secondary | ICD-10-CM

## 2018-03-17 DIAGNOSIS — N5201 Erectile dysfunction due to arterial insufficiency: Secondary | ICD-10-CM

## 2018-03-17 MED ORDER — SILDENAFIL CITRATE 20 MG PO TABS: ORAL_TABLET | ORAL | 1 refills | Status: DC

## 2018-03-17 NOTE — Progress Notes (Signed)
03/17/2018 9:15 AM   Dustin Paul 1960/10/25 [id number]  Referring provider: Birdie Sons, MD [address] [place], [place] [address]  Chief Complaint  Patient presents with  . Establish Care  . Erectile Dysfunction   Urologic history: 1.  Nephrolithiasis -3 mm left renal calculus  2.  Erectile dysfunction -On sildenafil  HPI: 57 year old male presents for annual follow-up of the above problem list.  I last saw him at [facility] in June 2018.  See states he has been doing well since his last visit.  He denies flank/abdominal pain or renal colic.  He is using generic sildenafil which has been effective.  He has no bothersome lower urinary tract symptoms.  His PSA is checked annually at [facility].  PMH: Past Medical History:  Diagnosis Date  . Aneurysm of artery ([place]) 2011   LEFT (19 MM) AND RIGHT (11 MM) middle cerebral  . Chronic kidney disease    H/O STONES  . Complication of anesthesia   . Headache    MIGRAINES  . Hypertension   . Insomnia   . Lumbar stress fracture   . PONV (postoperative nausea and vomiting)    AFTER KNEE SURGERY IN 1980'S  . Seasonal allergies   . Seizures ([place])    last seizure in 2015  . Visual hallucinations     Surgical History: Past Surgical History:  Procedure Laterality Date  . BRAIN SURGERY  2011   2 ANEURYSMS-TOTAL OF 9 CLIPS IN BRAIN FROM ANEURYSM  . COLONOSCOPY WITH PROPOFOL N/A 01/16/2015   Procedure: COLONOSCOPY WITH PROPOFOL;  Surgeon: Hulen Luster, MD;  Location: [facility] ENDOSCOPY;  Service: Gastroenterology;  Laterality: N/A;  . KNEE ARTHROSCOPY WITH MEDIAL MENISECTOMY Left 11/20/2015   Procedure: KNEE ARTHROSCOPY WITH partial MEDIAL MENISECTOMY / WITH REMOVAL OF LOOSE BODY;  Surgeon: Hessie Knows, MD;  Location: [facility] ORS;  Service: Orthopedics;  Laterality: Left;  . KNEE SURGERY Left     Home Medications:  Allergies as of 03/17/2018      Reactions   Aspirin    Nose bleed, thin blood   Levetiracetam    Racing thoughts per [facility] neurology note 03-11-11      Medication List        Accurate as of 03/17/18  9:15 AM. Always use your most recent med list.          ABILIFY 5 MG tablet Generic drug:  ARIPiprazole Take 1 tablet (5 mg total) by mouth daily.   Alpha-Lipoic Acid 300 MG Caps Take 300 mg by mouth every evening. Reported on 09/24/2015   cyclobenzaprine 10 MG tablet Commonly known as:  FLEXERIL Take 10 mg by mouth 3 (three) times daily as needed. Reported on 05/15/2015   lamoTRIgine 150 MG tablet Commonly known as:  LAMICTAL Take 1 tablet (150 mg total) by mouth 2 (two) times daily.   lisinopril 5 MG tablet Commonly known as:  PRINIVIL,ZESTRIL TAKE 1 TABLET BY MOUTH DAILY   Melatonin 10 MG Caps Take 1 capsule by mouth at bedtime.   mirtazapine 30 MG tablet Commonly known as:  REMERON Take 1 tablet (30 mg total) by mouth at bedtime.   QUDEXY XR 150 MG Cs24 sprinkle capsule Generic drug:  topiramate ER take 1 capsule by mouth daily at bedtime   rizatriptan 5 MG tablet Commonly known as:  MAXALT Take 5 mg by mouth as needed for migraine. May repeat in 2 hours if needed   sildenafil 20 MG tablet Commonly known  as:  REVATIO 2-5 tabs 1 hour prior to intercourse   traZODone 150 MG tablet Commonly known as:  DESYREL Take 1 tablet (150 mg total) by mouth at bedtime.       Allergies:  Allergies  Allergen Reactions  . Aspirin     Nose bleed, thin blood  . Levetiracetam     Racing thoughts per [facility] neurology note 03-11-11    Family History: History reviewed. No pertinent family history.  Social History:  reports that he has never smoked. He has never used smokeless tobacco. He reports that he does not drink alcohol or use drugs.  ROS: UROLOGY Frequent Urination?: No Hard to postpone urination?: No Burning/pain with urination?: No Get up at night to urinate?: No Leakage of urine?: No Urine stream starts and stops?: No Trouble  starting stream?: No Do you have to strain to urinate?: No Blood in urine?: No Urinary tract infection?: No Sexually transmitted disease?: No Injury to kidneys or bladder?: No Painful intercourse?: No Weak stream?: No Erection problems?: Yes Penile pain?: No  Gastrointestinal Nausea?: No Vomiting?: No Indigestion/heartburn?: No Diarrhea?: No Constipation?: No  Constitutional Fever: No Night sweats?: No Weight loss?: No Fatigue?: No  Skin Skin rash/lesions?: No Itching?: No  Eyes Blurred vision?: No Double vision?: No  Ears/Nose/Throat Sore throat?: No Sinus problems?: No  Hematologic/Lymphatic Swollen glands?: No Easy bruising?: No  Cardiovascular Leg swelling?: No Chest pain?: No  Respiratory Cough?: No Shortness of breath?: No  Endocrine Excessive thirst?: No  Musculoskeletal Back pain?: No Joint pain?: No  Neurological Headaches?: No Dizziness?: No  Psychologic Depression?: No Anxiety?: No  Physical Exam: BP (!) 165/112 (BP Location: Left Arm, Patient Position: Sitting, Cuff Size: Normal)   Pulse 80   Ht 6' (1.829 m)   Wt 217 lb 3.2 oz (98.5 kg)   BMI 29.46 kg/m   Constitutional:  Alert and oriented, No acute distress. HEENT: [place] AT, moist mucus membranes.  Trachea midline, no masses. Cardiovascular: No clubbing, cyanosis, or edema. Respiratory: Normal respiratory effort, no increased work of breathing. GI: Abdomen is soft, nontender, nondistended, no abdominal masses GU: No CVA tenderness Lymph: No cervical or inguinal lymphadenopathy. Skin: No rashes, bruises or suspicious lesions. Neurologic: Grossly intact, no focal deficits, moving all 4 extremities. Psychiatric: Normal mood and affect.   Assessment & Plan:   57 year old male with nephrolithiasis and erectile dysfunction.  He has a asymptomatic small left renal calculus.  A KUB was ordered and he will be notified with results.  Sildenafil was refilled.  Continue annual  follow-up.  Return in about 1 year (around 03/18/2019) for Recheck.   Abbie Sons, [place] [address], [place], [place] [address] [phone]

## 2018-05-08 ENCOUNTER — Ambulatory Visit: Payer: Managed Care, Other (non HMO) | Admitting: Psychiatry

## 2018-05-12 ENCOUNTER — Other Ambulatory Visit: Payer: Self-pay | Admitting: Family Medicine

## 2018-05-12 MED ORDER — SILDENAFIL CITRATE 20 MG PO TABS: ORAL_TABLET | ORAL | 1 refills | Status: DC

## 2018-06-08 ENCOUNTER — Other Ambulatory Visit: Payer: Self-pay

## 2018-06-08 MED ORDER — SILDENAFIL CITRATE 20 MG PO TABS: ORAL_TABLET | ORAL | 1 refills | Status: DC

## 2018-06-12 ENCOUNTER — Ambulatory Visit: Payer: Managed Care, Other (non HMO) | Admitting: Psychiatry

## 2018-06-25 ENCOUNTER — Other Ambulatory Visit: Payer: Self-pay | Admitting: Psychiatry

## 2018-07-03 ENCOUNTER — Other Ambulatory Visit: Payer: Self-pay | Admitting: Psychiatry

## 2018-07-03 MED ORDER — MIRTAZAPINE 30 MG PO TABS: 30.0000 mg | ORAL_TABLET | Freq: Every day | ORAL | 0 refills | Status: DC

## 2018-07-03 MED ORDER — LAMOTRIGINE 150 MG PO TABS: 150.0000 mg | ORAL_TABLET | Freq: Two times a day (BID) | ORAL | 0 refills | Status: DC

## 2018-07-03 MED ORDER — ABILIFY 5 MG PO TABS: 5.0000 mg | ORAL_TABLET | Freq: Every day | ORAL | 0 refills | Status: DC

## 2018-07-03 NOTE — Telephone Encounter (Signed)
Pt called left message that he needs refills on his medications    traZODone (DESYREL) 150 MG tablet  Medication  Date: 01/09/2018 Department: [facility] Ordering/Authorizing: Rainey Pines, MD  Order Providers   Prescribing Provider Encounter Provider  Rainey Pines, MD Rainey Pines, MD  Outpatient Medication Detail    Disp Refills Start End   traZODone (DESYREL) 150 MG tablet 90 tablet 1 01/09/2018    Sig - Route: Take 1 tablet (150 mg total) by mouth at bedtime. - Oral   Sent to pharmacy as: traZODone (DESYREL) 150 MG tablet   E-Prescribing Status: Receipt confirmed by pharmacy (01/09/2018 3:59 PM EDT)

## 2018-07-04 ENCOUNTER — Telehealth: Payer: Self-pay

## 2018-07-04 NOTE — Telephone Encounter (Signed)
form for prior authorization was filled out and office note printed out. and everything faxed to [facility] prior authorization dept. - pending review.

## 2018-07-04 NOTE — Telephone Encounter (Signed)
prior authorization is needed for the lamictal.

## 2018-07-06 NOTE — Telephone Encounter (Signed)
received fax that Dustin Paul does not have a active coverage

## 2018-07-06 NOTE — Telephone Encounter (Signed)
faxed the cigna responce to pharmacy also requested that pharmacy check with patient about correct insurance infomation and if neccessary send information

## 2018-07-10 ENCOUNTER — Ambulatory Visit: Payer: Managed Care, Other (non HMO) | Admitting: Psychiatry

## 2018-07-10 ENCOUNTER — Telehealth: Payer: Self-pay

## 2018-07-10 NOTE — Telephone Encounter (Signed)
received fax requesting a prior authorizatio for the abilify

## 2018-07-10 NOTE — Telephone Encounter (Signed)
prior authorization was submitted - pending review.

## 2018-07-19 ENCOUNTER — Telehealth: Payer: Self-pay | Admitting: Psychiatry

## 2018-07-19 DIAGNOSIS — F39 Unspecified mood [affective] disorder: Secondary | ICD-10-CM

## 2018-07-19 MED ORDER — ARIPIPRAZOLE 5 MG PO TABS: 5.0000 mg | ORAL_TABLET | Freq: Every day | ORAL | 1 refills | Status: DC

## 2018-07-19 NOTE — Telephone Encounter (Signed)
Sent Abilify generic to pharmacy since brand name not covered.

## 2018-07-19 NOTE — Telephone Encounter (Signed)
per the pharmacy they need a rx for the generic abilify insurance will not cover name brand.

## 2018-07-31 ENCOUNTER — Encounter: Payer: Self-pay | Admitting: Psychiatry

## 2018-07-31 ENCOUNTER — Telehealth: Payer: Self-pay

## 2018-07-31 ENCOUNTER — Other Ambulatory Visit: Payer: Self-pay

## 2018-07-31 ENCOUNTER — Ambulatory Visit (INDEPENDENT_AMBULATORY_CARE_PROVIDER_SITE_OTHER): Payer: Medicare Other | Admitting: Psychiatry

## 2018-07-31 VITALS — BP 136/89 | HR 77 | Temp 98.3°F | Wt 222.0 lb

## 2018-07-31 DIAGNOSIS — F39 Unspecified mood [affective] disorder: Secondary | ICD-10-CM

## 2018-07-31 DIAGNOSIS — F5105 Insomnia due to other mental disorder: Secondary | ICD-10-CM

## 2018-07-31 MED ORDER — TRAZODONE HCL 150 MG PO TABS: 150.0000 mg | ORAL_TABLET | Freq: Every day | ORAL | 2 refills | Status: DC

## 2018-07-31 MED ORDER — MIRTAZAPINE 15 MG PO TABS: 15.0000 mg | ORAL_TABLET | Freq: Every day | ORAL | 2 refills | Status: DC

## 2018-07-31 MED ORDER — ARIPIPRAZOLE 5 MG PO TABS: 5.0000 mg | ORAL_TABLET | Freq: Every day | ORAL | 2 refills | Status: DC

## 2018-07-31 NOTE — Telephone Encounter (Signed)
LMTCB and reschedule AWV on 08/22/18. I will be out of the office for a meeting that afternoon. -MM

## 2018-07-31 NOTE — Progress Notes (Signed)
[place] MD/PA/NP OP Progress Note  07/31/2018 1:48 PM Dustin Paul  MRN:  [id number]  Subjective:  Patient is a 58 year old male with history of mood disorder presented for follow-up.  He reported that he has started a new job since October.  He reported that he is doing very well and has recently received a promotion towards the end of the year.  He was very excited and reported that he is financially stable.  He reported that he has also switched his insurance and has been having problems getting a refill on his medications.  He reported that the medications are helping him and he wants to decrease the dose of Remeron since he is stable for a long time.  Has good relationship with his wife.  Patient currently denied having any mood swings anger or anxiety or paranoia.  He reported that the current combination has helped and his mood symptoms are improving.  Patient reported that he sleeps well at night.  He reported that he does not have any use of drugs or alcohol at this time.  We discussed about his medications in detail.  He reported that he has good relationship with his new wife.  He does not want to change his medications at this time.  He will talk to the pharmacist about the prior authorization of the Abilify as it has been rejected by the pharmacy.     He appears calm and alert during the interview.     Chief Complaint: Chief Complaint    Follow-up     Visit Diagnosis:     ICD-10-CM   1. Episodic mood disorder ([facility]) F39 ARIPiprazole (ABILIFY) 5 MG tablet  2. Insomnia due to mental condition F51.05     Past Medical History:  Past Medical History:  Diagnosis Date  . Aneurysm of artery ([place]) 2011   LEFT (19 MM) AND RIGHT (11 MM) middle cerebral  . Chronic kidney disease    H/O STONES  . Complication of anesthesia   . Headache    MIGRAINES  . Hypertension   . Insomnia   . Lumbar stress fracture   . PONV (postoperative nausea and vomiting)    AFTER KNEE SURGERY IN 1980'S  .  Seasonal allergies   . Seizures ([place])    last seizure in 2015  . Visual hallucinations     Past Surgical History:  Procedure Laterality Date  . BRAIN SURGERY  2011   2 ANEURYSMS-TOTAL OF 9 CLIPS IN BRAIN FROM ANEURYSM  . COLONOSCOPY WITH PROPOFOL N/A 01/16/2015   Procedure: COLONOSCOPY WITH PROPOFOL;  Surgeon: Hulen Luster, MD;  Location: [facility] ENDOSCOPY;  Service: Gastroenterology;  Laterality: N/A;  . KNEE ARTHROSCOPY WITH MEDIAL MENISECTOMY Left 11/20/2015   Procedure: KNEE ARTHROSCOPY WITH partial MEDIAL MENISECTOMY / WITH REMOVAL OF LOOSE BODY;  Surgeon: Hessie Knows, MD;  Location: [facility] ORS;  Service: Orthopedics;  Laterality: Left;  . KNEE SURGERY Left    Family History: History reviewed. No pertinent family history. Social History:  Social History   Socioeconomic History  . Marital status: Married    Spouse name: Not on file  . Number of children: 2  . Years of education: Not on file  . Highest education level: Bachelor's degree (e.g., BA, AB, BS)  Occupational History  . Occupation: engineer/retired  Social Needs  . Financial resource strain: Not hard at all  . Food insecurity:    Worry: Never true    Inability: Never true  . Transportation needs:  Medical: No    Non-medical: No  Tobacco Use  . Smoking status: Never Smoker  . Smokeless tobacco: Never Used  Substance and Sexual Activity  . Alcohol use: No    Alcohol/week: 0.0 standard drinks    Comment: 1-2 drinks a month  . Drug use: No  . Sexual activity: Not Currently  Lifestyle  . Physical activity:    Days per week: Not on file    Minutes per session: Not on file  . Stress: Not at all  Relationships  . Social connections:    Talks on phone: Not on file    Gets together: Not on file    Attends religious service: Not on file    Active member of club or organization: Not on file    Attends meetings of clubs or organizations: Not on file    Relationship status: Not on file  Other Topics Concern  . Not on  file  Social History Narrative  . Not on file   Additional History:   Assessment:   Musculoskeletal: Strength & Muscle Tone: within normal limits Gait & Station: normal Patient leans: N/A  Psychiatric Specialty Exam: Medication Refill     ROS  Blood pressure 136/89, pulse 77, temperature 98.3 F (36.8 C), temperature source Oral, weight 222 lb (100.7 kg).Body mass index is 30.11 kg/m.  General Appearance: Well Groomed  Eye Contact:  Good  Speech:  Normal Rate  Volume:  Normal  Mood:  Okay  Affect:  Congruent  Thought Process:  Linear and Logical  Orientation:  Full (Time, Place, and Person)  Thought Content:  Negative  Suicidal Thoughts:  No  Homicidal Thoughts:  No  Memory:  Immediate;   Good Recent;   Good Remote;   Good  Judgement:  Good  Insight:  Good  Psychomotor Activity:  Negative  Concentration:  Good  Recall:  Good  Fund of Knowledge: Good  Language: Good  Akathisia:  Negative  Handed:    AIMS (if indicated):  Done 05/15/2015 and normal   Assets:  Communication Skills Desire for Improvement Social Support  ADL's:  Intact  Cognition: Impaired,  Moderate  Sleep:  Poor, he relates over past week he may wake up between the hours of 3 and 7    Is the patient at risk to self?  No. Has the patient been a risk to self in the past 6 months?  No. Has the patient been a risk to self within the distant past?  No. Is the patient a risk to others?  No. Has the patient been a risk to others in the past 6 months?  No. Has the patient been a risk to others within the distant past?  No.  Current Medications: Current Outpatient Medications  Medication Sig Dispense Refill  . Alpha-Lipoic Acid 300 MG CAPS Take 300 mg by mouth every evening. Reported on 09/24/2015    . ARIPiprazole (ABILIFY) 5 MG tablet Take 1 tablet (5 mg total) by mouth daily. 30 tablet 2  . cyclobenzaprine (FLEXERIL) 10 MG tablet Take 10 mg by mouth 3 (three) times daily as needed. Reported on  05/15/2015    . lamoTRIgine (LAMICTAL) 150 MG tablet Take 1 tablet (150 mg total) by mouth 2 (two) times daily. 60 tablet 0  . lisinopril (PRINIVIL,ZESTRIL) 5 MG tablet TAKE 1 TABLET BY MOUTH DAILY 90 tablet 3  . Melatonin 10 MG CAPS Take 1 capsule by mouth at bedtime.    . mirtazapine (REMERON) 15 MG tablet  Take 1 tablet (15 mg total) by mouth at bedtime. 30 tablet 2  . QUDEXY XR 150 MG CS24 take 1 capsule by mouth daily at bedtime  11  . rizatriptan (MAXALT) 5 MG tablet Take 5 mg by mouth as needed for migraine. May repeat in 2 hours if needed    . sildenafil (REVATIO) 20 MG tablet 2-5 tabs 1 hour prior to intercourse 120 tablet 1  . traZODone (DESYREL) 150 MG tablet Take 1 tablet (150 mg total) by mouth at bedtime. 30 tablet 2   No current facility-administered medications for this visit.     Medical Decision Making:  Established Problem, Stable/Improving (1), Review of Medication Regimen & Side Effects (2) and Review of New Medication or Change in Dosage (2)  Treatment Plan Summary:Medication management and Plan plan  Continue Abilify 5 mg by mouth daily.    Decrease mirtazapine 15 mg by mouth daily at bedtime Continue trazodone 150 mg by mouth daily at bedtime- .   Follow-up in 3 months or earlier depending on his symptoms   More than 50% of the time spent in psychoeducation, counseling and coordination of care.    This note was generated in part or whole with voice recognition software. Voice regonition is usually quite accurate but there are transcription errors that can and very often do occur. I apologize for any typographical errors that were not detected and corrected.   Rainey Pines, MD  07/31/2018, 1:48 PM

## 2018-08-07 ENCOUNTER — Telehealth: Payer: Self-pay | Admitting: Urology

## 2018-08-07 ENCOUNTER — Other Ambulatory Visit: Payer: Self-pay | Admitting: Family Medicine

## 2018-08-07 MED ORDER — SILDENAFIL CITRATE 20 MG PO TABS: ORAL_TABLET | ORAL | 1 refills | Status: DC

## 2018-08-07 NOTE — Telephone Encounter (Signed)
Pt called and needs a refill for his Sildenafil. He states that he uses [facility].

## 2018-08-07 NOTE — Telephone Encounter (Signed)
Per phone team pt r/c. LMTCB. -MM

## 2018-08-07 NOTE — Addendum Note (Signed)
Addended by: Feliberto Gottron on: 08/07/2018 04:55 PM   Modules accepted: Orders

## 2018-08-07 NOTE — Telephone Encounter (Signed)
Rx for Sildenafil sent to [facility]. Patient advised per [facility]

## 2018-08-09 DIAGNOSIS — G40909 Epilepsy, unspecified, not intractable, without status epilepticus: Secondary | ICD-10-CM | POA: Diagnosis not present

## 2018-08-09 DIAGNOSIS — I671 Cerebral aneurysm, nonruptured: Secondary | ICD-10-CM | POA: Diagnosis not present

## 2018-08-09 DIAGNOSIS — R51 Headache: Secondary | ICD-10-CM | POA: Diagnosis not present

## 2018-08-14 NOTE — Telephone Encounter (Signed)
LMTCB on my direct #.

## 2018-08-18 NOTE — Telephone Encounter (Signed)
AWV cancelled but not r/s.

## 2018-08-22 ENCOUNTER — Ambulatory Visit: Payer: Self-pay

## 2018-08-24 NOTE — Telephone Encounter (Signed)
Pt r/c and stated he was back at work and no longer on Medicare. Pt is not eligible for the AWV. Closing encounter.  -MM

## 2018-08-24 NOTE — Telephone Encounter (Signed)
LMTCB to scheduled telephonic AWV for sometime next week. Need to verify # on file to call. Direct # left to CB on.  -MM

## 2018-09-23 ENCOUNTER — Other Ambulatory Visit: Payer: Self-pay | Admitting: Psychiatry

## 2018-09-25 ENCOUNTER — Encounter: Payer: Self-pay | Admitting: Family Medicine

## 2018-11-06 ENCOUNTER — Ambulatory Visit: Payer: 59 | Admitting: Psychiatry

## 2018-11-08 ENCOUNTER — Telehealth: Payer: Self-pay

## 2018-11-08 NOTE — Telephone Encounter (Signed)
Needs office visit.

## 2018-11-08 NOTE — Telephone Encounter (Signed)
Patient is requesting a referral to GI due to swallowing issues.

## 2018-11-08 NOTE — Telephone Encounter (Signed)
Apt made 11/20/2018 at 8:20   Thanks,   -Mickel Baas

## 2018-11-09 NOTE — Telephone Encounter (Signed)
Encounter error

## 2018-11-10 ENCOUNTER — Other Ambulatory Visit: Payer: Self-pay | Admitting: Family Medicine

## 2018-11-10 MED ORDER — SILDENAFIL CITRATE 20 MG PO TABS: ORAL_TABLET | ORAL | 1 refills | Status: DC

## 2018-11-20 ENCOUNTER — Ambulatory Visit
Admission: RE | Admit: 2018-11-20 | Discharge: 2018-11-20 | Disposition: A | Discharge status: Home / Self Care | Payer: BC Managed Care – PPO | Source: Ambulatory Visit | Attending: Family Medicine | Admitting: Family Medicine

## 2018-11-20 ENCOUNTER — Other Ambulatory Visit: Payer: Self-pay

## 2018-11-20 ENCOUNTER — Ambulatory Visit: Payer: Medicare Other | Admitting: Family Medicine

## 2018-11-20 ENCOUNTER — Ambulatory Visit (INDEPENDENT_AMBULATORY_CARE_PROVIDER_SITE_OTHER): Payer: BC Managed Care – PPO | Admitting: Family Medicine

## 2018-11-20 ENCOUNTER — Ambulatory Visit: Payer: Self-pay | Admitting: Family Medicine

## 2018-11-20 ENCOUNTER — Ambulatory Visit: Payer: Medicare Other | Admitting: Psychiatry

## 2018-11-20 ENCOUNTER — Encounter: Payer: Self-pay | Admitting: Family Medicine

## 2018-11-20 VITALS — BP 116/80 | HR 76 | Temp 99.1°F | Resp 16 | Wt 216.0 lb

## 2018-11-20 DIAGNOSIS — R059 Cough, unspecified: Secondary | ICD-10-CM

## 2018-11-20 DIAGNOSIS — R05 Cough: Secondary | ICD-10-CM

## 2018-11-20 DIAGNOSIS — R1310 Dysphagia, unspecified: Secondary | ICD-10-CM | POA: Diagnosis not present

## 2018-11-20 DIAGNOSIS — K219 Gastro-esophageal reflux disease without esophagitis: Secondary | ICD-10-CM | POA: Diagnosis not present

## 2018-11-20 MED ORDER — RABEPRAZOLE SODIUM 20 MG PO TBEC: 20.0000 mg | DELAYED_RELEASE_TABLET | Freq: Every day | ORAL | 2 refills | Status: DC

## 2018-11-20 NOTE — Progress Notes (Signed)
Patient: Dustin Paul Male    DOB: July 21, 1960   58 y.o.   MRN: [id number] Visit Date: 11/20/2018  Today's Provider: Lelon Huh, MD   Chief Complaint  Patient presents with  . Dysphagia    Started about two to three months ago.  . Cough    Chronic issue for about 2-3 years.   Subjective:     Cough This is a chronic problem. The current episode started more than 1 year ago. The problem has been gradually worsening. The cough is non-productive. Associated symptoms include heartburn and postnasal drip. Pertinent negatives include no ear congestion, ear pain, hemoptysis, nasal congestion, rash, rhinorrhea, sore throat, shortness of breath or weight loss. His past medical history is significant for environmental allergies.  Gastroesophageal Reflux He complains of coughing, dysphagia and heartburn. He reports no hoarse voice, no nausea or no sore throat. This is a chronic problem. The problem has been gradually worsening. The heartburn wakes him from sleep. Pertinent negatives include no weight loss.   Wt Readings from Last 3 Encounters:  11/20/18 216 lb (98 kg)  03/17/18 217 lb 3.2 oz (98.5 kg)  08/10/16 209 lb (94.8 kg)    Allergies  Allergen Reactions  . Aspirin     Nose bleed, thin blood  . Levetiracetam     Racing thoughts per [facility] neurology note 03-11-11     Current Outpatient Medications:  .  Alpha-Lipoic Acid 300 MG CAPS, Take 300 mg by mouth every evening. Reported on 09/24/2015, Disp: , Rfl:  .  ARIPiprazole (ABILIFY) 5 MG tablet, Take 1 tablet (5 mg total) by mouth daily., Disp: 30 tablet, Rfl: 2 .  cyclobenzaprine (FLEXERIL) 10 MG tablet, Take 10 mg by mouth 3 (three) times daily as needed. Reported on 05/15/2015, Disp: , Rfl:  .  famotidine (PEPCID) 20 MG tablet, Take 20 mg by mouth 2 (two) times daily., Disp: , Rfl:  .  famotidine-calcium carbonate-magnesium hydroxide (PEPCID COMPLETE) 10-800-165 MG chewable tablet, Chew 1 tablet by mouth daily as  needed., Disp: , Rfl:  .  lamoTRIgine (LAMICTAL) 150 MG tablet, Take 1 tablet (150 mg total) by mouth 2 (two) times daily., Disp: 60 tablet, Rfl: 0 .  lisinopril (PRINIVIL,ZESTRIL) 5 MG tablet, TAKE 1 TABLET BY MOUTH DAILY, Disp: 90 tablet, Rfl: 3 .  Melatonin 10 MG CAPS, Take 1 capsule by mouth at bedtime., Disp: , Rfl:  .  mirtazapine (REMERON) 15 MG tablet, Take 1 tablet (15 mg total) by mouth at bedtime., Disp: 30 tablet, Rfl: 2 .  QUDEXY XR 150 MG CS24, take 1 capsule by mouth daily at bedtime, Disp: , Rfl: 11 .  rizatriptan (MAXALT) 5 MG tablet, Take 5 mg by mouth as needed for migraine. May repeat in 2 hours if needed, Disp: , Rfl:  .  sildenafil (REVATIO) 20 MG tablet, 2-5 tabs 1 hour prior to intercourse, Disp: 120 tablet, Rfl: 1 .  traZODone (DESYREL) 150 MG tablet, TAKE 1 TABLET AT BEDTIME, Disp: 30 tablet, Rfl: 2  Review of Systems  Constitutional: Negative for weight loss.  HENT: Positive for postnasal drip. Negative for ear discharge, ear pain, hoarse voice, rhinorrhea, sinus pressure, sinus pain, sore throat and voice change.   Eyes: Negative.   Respiratory: Positive for cough. Negative for apnea, hemoptysis, chest tightness, shortness of breath and stridor.   Gastrointestinal: Positive for dysphagia and heartburn. Negative for abdominal distention, anal bleeding, blood in stool, constipation, diarrhea, nausea and rectal pain.  Trouble with reflux.   Skin: Negative for rash.  Allergic/Immunologic: Positive for environmental allergies.    Social History   Tobacco Use  . Smoking status: Never Smoker  . Smokeless tobacco: Never Used  Substance Use Topics  . Alcohol use: No    Alcohol/week: 0.0 standard drinks    Comment: 1-2 drinks a month      Objective:   BP 116/80 (BP Location: Right Arm, Patient Position: Sitting, Cuff Size: Large)   Pulse 76   Temp 99.1 F (37.3 C) (Oral)   Resp 16   Wt 216 lb (98 kg)   BMI 29.29 kg/m  Vitals:   11/20/18 1548  BP:  116/80  Pulse: 76  Resp: 16  Temp: 99.1 F (37.3 C)  TempSrc: Oral  Weight: 216 lb (98 kg)     Physical Exam   General Appearance:    Alert, cooperative, no distress  ENT: OP pink and clear. No oral or neck lesions.   Eyes:    PERRL, conjunctiva/corneas clear, EOM's intact       Lungs:     Clear to auscultation bilaterally, respirations unlabored  Heart:    Regular rate and rhythm  Neurologic:   Awake, alert, oriented x 3. No apparent focal neurological           defect.           Assessment & Plan    1. Cough Likely secondary to GERD. If chest XR is clear will refer GI for EGD.  - DG Chest 2 View; Future  2. Gastroesophageal reflux disease, esophagitis presence not specified add - RABEprazole (ACIPHEX) 20 MG tablet; Take 1 tablet (20 mg total) by mouth daily.  Dispense: 30 tablet; Refill: 2  3. Dysphagia, unspecified type Likely secondary to GERD as above.   The entirety of the information documented in the History of Present Illness, Review of Systems and Physical Exam were personally obtained by me. Portions of this information were initially documented by Ashley Royalty, CMA and reviewed by me for thoroughness and accuracy.      Lelon Huh, MD  [facility]

## 2018-11-20 NOTE — Patient Instructions (Addendum)
.   Please review the attached list of medications and notify my office if there are any errors.   . Please bring all of your medications to every appointment so we can make sure that our medication list is the same as yours.  .  . Go to the [facility] on [address] for chest Xray   

## 2018-11-21 ENCOUNTER — Telehealth: Payer: Self-pay | Admitting: Family Medicine

## 2018-11-21 ENCOUNTER — Other Ambulatory Visit: Payer: Self-pay | Admitting: Family Medicine

## 2018-11-21 DIAGNOSIS — R05 Cough: Secondary | ICD-10-CM

## 2018-11-21 DIAGNOSIS — R1310 Dysphagia, unspecified: Secondary | ICD-10-CM

## 2018-11-21 DIAGNOSIS — R059 Cough, unspecified: Secondary | ICD-10-CM

## 2018-11-21 DIAGNOSIS — K219 Gastro-esophageal reflux disease without esophagitis: Secondary | ICD-10-CM

## 2018-11-21 NOTE — Telephone Encounter (Signed)
Pt calling to check back on Xray results.  Please call pt back on cell phone once they are in.  Thanks, [employer]

## 2018-11-21 NOTE — Progress Notes (Signed)
Refer gi for GERD

## 2018-11-21 NOTE — Telephone Encounter (Signed)
Pt advised of X-ray results.    Thanks,   -Krithik Mapel  

## 2018-11-27 ENCOUNTER — Encounter: Payer: Self-pay | Admitting: Psychiatry

## 2018-11-27 ENCOUNTER — Other Ambulatory Visit: Payer: Self-pay

## 2018-11-27 ENCOUNTER — Ambulatory Visit (INDEPENDENT_AMBULATORY_CARE_PROVIDER_SITE_OTHER): Payer: Medicare Other | Admitting: Psychiatry

## 2018-11-27 DIAGNOSIS — R6881 Early satiety: Secondary | ICD-10-CM | POA: Diagnosis not present

## 2018-11-27 DIAGNOSIS — D126 Benign neoplasm of colon, unspecified: Secondary | ICD-10-CM | POA: Diagnosis not present

## 2018-11-27 DIAGNOSIS — K219 Gastro-esophageal reflux disease without esophagitis: Secondary | ICD-10-CM | POA: Diagnosis not present

## 2018-11-27 DIAGNOSIS — R1314 Dysphagia, pharyngoesophageal phase: Secondary | ICD-10-CM | POA: Diagnosis not present

## 2018-11-27 DIAGNOSIS — F39 Unspecified mood [affective] disorder: Secondary | ICD-10-CM

## 2018-11-27 MED ORDER — TRAZODONE HCL 150 MG PO TABS: 150.0000 mg | ORAL_TABLET | Freq: Every day | ORAL | 2 refills | Status: DC

## 2018-11-27 MED ORDER — ARIPIPRAZOLE 5 MG PO TABS: 5.0000 mg | ORAL_TABLET | Freq: Every day | ORAL | 2 refills | Status: DC

## 2018-11-27 MED ORDER — MIRTAZAPINE 15 MG PO TABS: 15.0000 mg | ORAL_TABLET | Freq: Every day | ORAL | 2 refills | Status: DC

## 2018-11-27 MED ORDER — LAMOTRIGINE 150 MG PO TABS: 150.0000 mg | ORAL_TABLET | Freq: Two times a day (BID) | ORAL | 2 refills | Status: DC

## 2018-11-27 NOTE — Progress Notes (Signed)
Patient ID: Dustin Paul, male   DOB: 02-May-1961, 58 y.o.   MRN: [id number]  Patient is a 58 year old male with history of mood disorder who was followed up for his medication management. He reported that he is doing well and has been working from home. He reported that he is enjoying his work from home as he is currently working 40 to 45 hours. 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 He stated that he has gained 10 to 12 pounds working long hours and is trying to lose weight. He has already lost 2 to 4 pounds by eating solid at night. He stated that he is also starting to walk. He reported that he is happy that nobody in his family has Covid symptoms. He reported that he is sleeping well with the help of trazodone. He denied having any side effects the medication. He is also managing his diabetes. No other acute symptoms noted at this time. He remains compliant with his medications.  Plan  I will refill his medications. Follow up in two months earlier depending on his symptoms  I discussed the assessment and treatment plan with the patient. The patient was provided an opportunity to ask questions and all were answered. The patient agreed with the plan and demonstrated an understanding of the instructions.   The patient was advised to call back or seek an in-person evaluation if the symptoms worsen or if the condition fails to improve as anticipated.   I provided 15 minutes of non-face-to-face time during this encounter.

## 2018-11-30 ENCOUNTER — Other Ambulatory Visit: Payer: Self-pay

## 2018-11-30 ENCOUNTER — Other Ambulatory Visit
Admission: RE | Admit: 2018-11-30 | Discharge: 2018-11-30 | Disposition: A | Discharge status: Home / Self Care | Payer: BC Managed Care – PPO | Source: Ambulatory Visit | Attending: Internal Medicine | Admitting: Internal Medicine

## 2018-11-30 DIAGNOSIS — Z01812 Encounter for preprocedural laboratory examination: Secondary | ICD-10-CM | POA: Insufficient documentation

## 2018-11-30 DIAGNOSIS — Z1159 Encounter for screening for other viral diseases: Secondary | ICD-10-CM | POA: Insufficient documentation

## 2018-11-30 LAB — SARS CORONAVIRUS 2 (TAT 6-24 HRS): SARS Coronavirus 2: NEGATIVE

## 2018-12-01 ENCOUNTER — Other Ambulatory Visit: Admission: RE | Admit: 2018-12-01 | Payer: BC Managed Care – PPO | Source: Ambulatory Visit

## 2018-12-05 ENCOUNTER — Ambulatory Visit: Payer: BC Managed Care – PPO | Admitting: Anesthesiology

## 2018-12-05 ENCOUNTER — Ambulatory Visit
Admission: RE | Admit: 2018-12-05 | Discharge: 2018-12-05 | Disposition: A | Discharge status: Home / Self Care | Payer: BC Managed Care – PPO | Attending: Internal Medicine | Admitting: Internal Medicine

## 2018-12-05 ENCOUNTER — Encounter
Admission: RE | Disposition: A | Discharge status: Home / Self Care | Payer: Self-pay | Source: Home / Self Care | Attending: Internal Medicine

## 2018-12-05 ENCOUNTER — Other Ambulatory Visit: Payer: Self-pay

## 2018-12-05 ENCOUNTER — Encounter: Payer: Self-pay | Admitting: Anesthesiology

## 2018-12-05 DIAGNOSIS — K219 Gastro-esophageal reflux disease without esophagitis: Secondary | ICD-10-CM | POA: Diagnosis not present

## 2018-12-05 DIAGNOSIS — N189 Chronic kidney disease, unspecified: Secondary | ICD-10-CM | POA: Insufficient documentation

## 2018-12-05 DIAGNOSIS — Z79899 Other long term (current) drug therapy: Secondary | ICD-10-CM | POA: Diagnosis not present

## 2018-12-05 DIAGNOSIS — I129 Hypertensive chronic kidney disease with stage 1 through stage 4 chronic kidney disease, or unspecified chronic kidney disease: Secondary | ICD-10-CM | POA: Insufficient documentation

## 2018-12-05 DIAGNOSIS — K449 Diaphragmatic hernia without obstruction or gangrene: Secondary | ICD-10-CM | POA: Diagnosis not present

## 2018-12-05 DIAGNOSIS — K228 Other specified diseases of esophagus: Secondary | ICD-10-CM | POA: Diagnosis not present

## 2018-12-05 DIAGNOSIS — K222 Esophageal obstruction: Secondary | ICD-10-CM | POA: Insufficient documentation

## 2018-12-05 DIAGNOSIS — K210 Gastro-esophageal reflux disease with esophagitis: Secondary | ICD-10-CM | POA: Diagnosis not present

## 2018-12-05 DIAGNOSIS — R1314 Dysphagia, pharyngoesophageal phase: Secondary | ICD-10-CM | POA: Diagnosis not present

## 2018-12-05 DIAGNOSIS — G43909 Migraine, unspecified, not intractable, without status migrainosus: Secondary | ICD-10-CM | POA: Insufficient documentation

## 2018-12-05 DIAGNOSIS — R6881 Early satiety: Secondary | ICD-10-CM | POA: Insufficient documentation

## 2018-12-05 DIAGNOSIS — R569 Unspecified convulsions: Secondary | ICD-10-CM | POA: Diagnosis not present

## 2018-12-05 DIAGNOSIS — G4700 Insomnia, unspecified: Secondary | ICD-10-CM | POA: Diagnosis not present

## 2018-12-05 HISTORY — PX: ESOPHAGOGASTRODUODENOSCOPY (EGD) WITH PROPOFOL: SHX5813

## 2018-12-05 SURGERY — ESOPHAGOGASTRODUODENOSCOPY (EGD) WITH PROPOFOL
Anesthesia: General

## 2018-12-05 MED ORDER — PROPOFOL 10 MG/ML IV BOLUS
INTRAVENOUS | Status: AC
  Filled 2018-12-05: qty 20

## 2018-12-05 MED ORDER — SODIUM CHLORIDE 0.9 % IV SOLN
INTRAVENOUS | Status: DC
  Administered 2018-12-05: 1000 mL via INTRAVENOUS

## 2018-12-05 MED ORDER — PROPOFOL 500 MG/50ML IV EMUL
INTRAVENOUS | Status: DC | PRN
  Administered 2018-12-05: 160 ug/kg/min via INTRAVENOUS

## 2018-12-05 MED ORDER — PROPOFOL 10 MG/ML IV BOLUS
INTRAVENOUS | Status: DC | PRN
  Administered 2018-12-05: 80 mg via INTRAVENOUS

## 2018-12-05 NOTE — Anesthesia Preprocedure Evaluation (Signed)
Anesthesia Evaluation  Patient identified by MRN, date of birth, ID band Patient awake    Reviewed: Allergy & Precautions, NPO status , Patient's Chart, lab work & pertinent test results, reviewed documented beta blocker date and time   History of Anesthesia Complications (+) PONV and history of anesthetic complications  Airway Mallampati: II  TM Distance: >3 FB     Dental  (+) Chipped   Pulmonary           Cardiovascular hypertension, Pt. on medications + dysrhythmias      Neuro/Psych  Headaches, Seizures -,     GI/Hepatic   Endo/Other    Renal/GU Renal disease     Musculoskeletal   Abdominal   Peds  Hematology   Anesthesia Other Findings Brain aneurysms clipped. No seizures since then.  Reproductive/Obstetrics                             Anesthesia Physical Anesthesia Plan  ASA: III  Anesthesia Plan: General   Post-op Pain Management:    Induction: Intravenous  PONV Risk Score and Plan:   Airway Management Planned:   Additional Equipment:   Intra-op Plan:   Post-operative Plan:   Informed Consent: I have reviewed the patients History and Physical, chart, labs and discussed the procedure including the risks, benefits and alternatives for the proposed anesthesia with the patient or authorized representative who has indicated his/her understanding and acceptance.       Plan Discussed with: CRNA  Anesthesia Plan Comments:         Anesthesia Quick Evaluation

## 2018-12-05 NOTE — H&P (Signed)
Outpatient short stay form Pre-procedure 12/05/2018 10:51 AM Dustin Paul, M.D.  Primary Physician: Lelon Huh, MD  Reason for visit: Pharyngoesophageal dysphagia, treatment refractory GERD, early satiety.  History of present illness: 58 year old white male with a long history of GERD presents with several months of pharyngoesophageal dysphagia as well as some resistance to treatment with AcipHex.  On his initial visit last month, patient was increased to AcipHex 20 mg daily, same dose, but adding Pepcid twice daily.  Patient has had incomplete relief.  There is been no hematemesis or melena.    Current Facility-Administered Medications:  .  0.9 %  sodium chloride infusion, , Intravenous, Continuous, [place], Benay Pike, MD, Last Rate: 20 mL/hr at 12/05/18 1007, 1,000 mL at 12/05/18 1007  Medications Prior to Admission  Medication Sig Dispense Refill Last Dose  . Alpha-Lipoic Acid 300 MG CAPS Take 300 mg by mouth every evening. Reported on 09/24/2015   12/04/2018 at Unknown time  . ARIPiprazole (ABILIFY) 5 MG tablet Take 1 tablet (5 mg total) by mouth daily. 30 tablet 2 12/04/2018 at Unknown time  . cyclobenzaprine (FLEXERIL) 10 MG tablet Take 10 mg by mouth 3 (three) times daily as needed. Reported on 05/15/2015   12/04/2018 at Unknown time  . famotidine (PEPCID) 20 MG tablet Take 20 mg by mouth 2 (two) times daily.   12/04/2018 at Unknown time  . famotidine-calcium carbonate-magnesium hydroxide (PEPCID COMPLETE) 10-800-165 MG chewable tablet Chew 1 tablet by mouth daily as needed.   12/04/2018 at Unknown time  . lamoTRIgine (LAMICTAL) 150 MG tablet Take 1 tablet (150 mg total) by mouth 2 (two) times daily. 60 tablet 2 12/05/2018 at Unknown time  . lisinopril (PRINIVIL,ZESTRIL) 5 MG tablet TAKE 1 TABLET BY MOUTH DAILY 90 tablet 3 12/04/2018 at Unknown time  . Melatonin 10 MG CAPS Take 1 capsule by mouth at bedtime.   12/04/2018 at Unknown time  . mirtazapine (REMERON) 15 MG tablet Take 1 tablet (15 mg  total) by mouth at bedtime. 30 tablet 2 12/04/2018 at Unknown time  . QUDEXY XR 150 MG CS24 take 1 capsule by mouth daily at bedtime  11 Past Week at Unknown time  . RABEprazole (ACIPHEX) 20 MG tablet Take 1 tablet (20 mg total) by mouth daily. 30 tablet 2 12/04/2018 at Unknown time  . rizatriptan (MAXALT) 5 MG tablet Take 5 mg by mouth as needed for migraine. May repeat in 2 hours if needed   Past Week at Unknown time  . sildenafil (REVATIO) 20 MG tablet 2-5 tabs 1 hour prior to intercourse 120 tablet 1 Past Week at Unknown time  . traZODone (DESYREL) 150 MG tablet Take 1 tablet (150 mg total) by mouth at bedtime. 30 tablet 2 12/04/2018 at Unknown time     Allergies  Allergen Reactions  . Aspirin     Nose bleed, thin blood  . Levetiracetam     Racing thoughts per [facility] neurology note 03-11-11     Past Medical History:  Diagnosis Date  . 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 Chronic kidney disease    H/O STONES  . Complication of anesthesia   . Headache    MIGRAINES  . Hypertension   . Insomnia   . Lumbar stress fracture   . PONV (postoperative nausea and vomiting)    AFTER KNEE SURGERY IN 1980'S  . Seasonal allergies   . Seizures ([place])    last seizure in  2015  . Visual hallucinations     Review of systems:  Otherwise negative.    Physical Exam  Gen: Alert, oriented. Appears stated age.  HEENT: [place]/AT. PERRLA. Lungs: CTA, no wheezes. CV: RR nl S1, S2. Abd: soft, benign, no masses. BS+ Ext: No edema. Pulses 2+    Planned procedures: Proceed with EGD. The patient understands the nature of the planned procedure, indications, risks, alternatives and potential complications including but not limited to bleeding, infection, perforation, damage to internal organs and possible oversedation/side effects from anesthesia. The patient agrees and gives consent to proceed.  Please refer to procedure notes for findings, recommendations and  patient disposition/instructions.     Dustin Paul, M.D. Gastroenterology 12/05/2018  10:51 AM

## 2018-12-05 NOTE — Interval H&P Note (Signed)
History and Physical Interval Note:  12/05/2018 10:53 AM  Dustin Paul  has presented today for surgery, with the diagnosis of GERD.  The various methods of treatment have been discussed with the patient and family. After consideration of risks, benefits and other options for treatment, the patient has consented to  Procedure(s): ESOPHAGOGASTRODUODENOSCOPY (EGD) WITH PROPOFOL (N/A) as a surgical intervention.  The patient's history has been reviewed, patient examined, no change in status, stable for surgery.  I have reviewed the patient's chart and labs.  Questions were answered to the patient's satisfaction.     [place], [place]

## 2018-12-05 NOTE — Op Note (Signed)
[facility] Gastroenterology Patient Name: Dustin Paul Procedure Date: 12/05/2018 11:01 AM MRN: [id number] Account #: [id number] Date of Birth: [birth year] Admit Type: Outpatient Age: 58 Room: [facility] ENDO ROOM 3 Gender: Male Note Status: Finalized Procedure:            Upper GI endoscopy Indications:          Pharyngeal phase dysphagia, Suspected gastro-esophageal                        reflux disease, Failure to respond to medical                        treatment, Early satiety Providers:            Benay Pike. Eulonda Andalon MD, MD Medicines:            Propofol per Anesthesia Complications:        No immediate complications. Procedure:            Pre-Anesthesia Assessment:                       - The risks and benefits of the procedure and the                        sedation options and risks were discussed with the                        patient. All questions were answered and informed                        consent was obtained.                       - Patient identification and proposed procedure were                        verified prior to the procedure by the nurse. The                        procedure was verified in the procedure room.                       - ASA Grade Assessment: III - A patient with severe                        systemic disease.                       - After reviewing the risks and benefits, the patient                        was deemed in satisfactory condition to undergo the                        procedure.                       After obtaining informed consent, the endoscope was                        passed under direct vision. Throughout the procedure,  the patient's blood pressure, pulse, and oxygen                        saturations were monitored continuously. The Endoscope                        was introduced through the mouth, and advanced to the                        third part of duodenum. The upper GI  endoscopy was                        accomplished without difficulty. The patient tolerated                        the procedure well. Findings:      Diffuse mild mucosal changes characterized by a decreased vascular       pattern and white specks were found in the mid esophagus and in the       distal esophagus. Biopsies were obtained from the proximal and distal       esophagus with cold forceps for histology of suspected eosinophilic       esophagitis.      One benign-appearing, intrinsic mild (non-circumferential scarring)       stenosis was found in the distal esophagus. This stenosis measured 1.4       cm (inner diameter) x less than one cm (in length). The stenosis was       traversed. The scope was withdrawn. Dilation was performed with a       Maloney dilator with no resistance at 66 Fr.      A 2 cm hiatal hernia was present.      The examined duodenum was normal.      The exam was otherwise without abnormality. Impression:           - Decreased vascular pattern, white specked mucosa in                        the esophagus. Biopsied.                       - Benign-appearing esophageal stenosis. Dilated.                       - 2 cm hiatal hernia.                       - Normal examined duodenum.                       - The examination was otherwise normal. Recommendation:       - Patient has a contact number available for                        emergencies. The signs and symptoms of potential                        delayed complications were discussed with the patient.                        Return to normal activities tomorrow. Written discharge  instructions were provided to the patient.                       - Resume previous diet.                       - Continue present medications.                       - Await pathology results.                       - Return to physician assistant in 4 weeks.                       - You will be seen by Octavia Bruckner, PA-C for your                        follow up visit in the office. Procedure Code(s):    --- Professional ---                       [phone], Esophagogastroduodenoscopy, flexible, transoral;                        with biopsy, single or multiple                       43450, Dilation of esophagus, by unguided sound or                        bougie, single or multiple passes Diagnosis Code(s):    --- Professional ---                       R68.81, Early satiety                       R13.13, Dysphagia, pharyngeal phase                       K44.9, Diaphragmatic hernia without obstruction or                        gangrene                       K22.2, Esophageal obstruction                       K22.8, Other specified diseases of esophagus CPT copyright 2019 American Medical Association. All rights reserved. The codes documented in this report are preliminary and upon coder review may  be revised to meet current compliance requirements. Efrain Sella MD, MD 12/05/2018 11:23:11 AM This report has been signed electronically. Number of Addenda: 0 Note Initiated On: 12/05/2018 11:01 AM      [facility]

## 2018-12-05 NOTE — Anesthesia Post-op Follow-up Note (Signed)
Anesthesia QCDR form completed.        

## 2018-12-05 NOTE — Anesthesia Postprocedure Evaluation (Signed)
Anesthesia Post Note  Patient: Dustin Paul  Procedure(s) Performed: ESOPHAGOGASTRODUODENOSCOPY (EGD) WITH PROPOFOL (N/A )  Patient location during evaluation: Endoscopy Anesthesia Type: General Level of consciousness: awake and alert Pain management: pain level controlled Vital Signs Assessment: post-procedure vital signs reviewed and stable Respiratory status: spontaneous breathing, nonlabored ventilation, respiratory function stable and patient connected to nasal cannula oxygen Cardiovascular status: blood pressure returned to baseline and stable Postop Assessment: no apparent nausea or vomiting Anesthetic complications: no     Last Vitals:  Vitals:   12/05/18 1140 12/05/18 1150  BP: (!) 134/93 (!) 129/93  Pulse:    Resp:    Temp:    SpO2:      Last Pain:  Vitals:   12/05/18 1150  TempSrc:   PainSc: 0-No pain                 Klye Besecker S

## 2018-12-05 NOTE — Transfer of Care (Signed)
Immediate Anesthesia Transfer of Care Note  Patient: Dustin Paul  Procedure(s) Performed: ESOPHAGOGASTRODUODENOSCOPY (EGD) WITH PROPOFOL (N/A )  Patient Location: PACU  Anesthesia Type:General  Level of Consciousness: drowsy  Airway & Oxygen Therapy: Patient Spontanous Breathing and Patient connected to nasal cannula oxygen  Post-op Assessment: Report given to RN and Post -op Vital signs reviewed and stable  Post vital signs: Reviewed and stable  Last Vitals:  Vitals Value Taken Time  BP 139/92 12/05/18 1121  Temp 36.1 C 12/05/18 1121  Pulse 77 12/05/18 1121  Resp    SpO2 99 % 12/05/18 1121    Last Pain:  Vitals:   12/05/18 1121  TempSrc: Tympanic  PainSc: Asleep         Complications: No apparent anesthesia complications

## 2018-12-06 ENCOUNTER — Encounter: Payer: Self-pay | Admitting: Internal Medicine

## 2018-12-06 LAB — SURGICAL PATHOLOGY

## 2018-12-12 ENCOUNTER — Other Ambulatory Visit: Payer: Self-pay | Admitting: Family Medicine

## 2018-12-12 ENCOUNTER — Encounter: Payer: Self-pay | Admitting: Family Medicine

## 2018-12-12 MED ORDER — SILDENAFIL CITRATE 20 MG PO TABS: ORAL_TABLET | ORAL | 3 refills | Status: DC

## 2019-01-22 ENCOUNTER — Ambulatory Visit (INDEPENDENT_AMBULATORY_CARE_PROVIDER_SITE_OTHER): Payer: BC Managed Care – PPO | Admitting: Psychiatry

## 2019-01-22 ENCOUNTER — Other Ambulatory Visit: Payer: Self-pay

## 2019-01-22 ENCOUNTER — Encounter: Payer: Self-pay | Admitting: Psychiatry

## 2019-01-22 DIAGNOSIS — F39 Unspecified mood [affective] disorder: Secondary | ICD-10-CM

## 2019-01-22 DIAGNOSIS — F5105 Insomnia due to other mental disorder: Secondary | ICD-10-CM

## 2019-01-22 MED ORDER — LAMOTRIGINE 150 MG PO TABS: 150.0000 mg | ORAL_TABLET | Freq: Two times a day (BID) | ORAL | 2 refills | Status: DC

## 2019-01-22 MED ORDER — ARIPIPRAZOLE 5 MG PO TABS: 5.0000 mg | ORAL_TABLET | Freq: Every day | ORAL | 2 refills | Status: DC

## 2019-01-22 MED ORDER — TRAZODONE HCL 150 MG PO TABS: 150.0000 mg | ORAL_TABLET | Freq: Every day | ORAL | 2 refills | Status: DC

## 2019-01-22 MED ORDER — MIRTAZAPINE 15 MG PO TABS: 15.0000 mg | ORAL_TABLET | Freq: Every day | ORAL | 2 refills | Status: DC

## 2019-01-22 NOTE — Progress Notes (Signed)
Patient ID: Dustin Paul, male   DOB: 05-05-1961, 58 y.o.   MRN: [id number]   Patient is a 58 year old male with history of Episodic mood disorder who was followed up for medication management.He reported that he has been doing well and busy in his work as he is working long hours and his company is really busy. He reported that he works from home. Patient reported that his medications are helping him and he's feeling much better. His headaches are improved and he is not taking the headache medication at this time. He is compliant with his medications. He sleeping well with the help of trazodone. He appeared calm and alert during the interview. He does not have any adverse reactions from his medications. He denied having any perceptual disturbances he denied having any suicidal or homicidal ideation or plans. No other acute symptoms noted at this time. He reported that he will exercise and go out for a walk with his wife on a regular basis.  Plan I will refill his medications for the next three months Follow-up in three months earlier depending on his symptoms    I connected with patient via telemedicine application and verified that I am speaking with the correct person using two identifiers.  I discussed the limitations of evaluation and management by telemedicine and the availability of in person appointments. The patient expressed understanding and agreed to proceed.   I discussed the assessment and treatment plan with the patient. The patient was provided an opportunity to ask questions and all were answered. The patient agreed with the plan and demonstrated an understanding of the instructions.   The patient was advised to call back or seek an in-person evaluation if the symptoms worsen or if the condition fails to improve as anticipated.   I provided 15 minutes of non-face-to-face time during this encounter.

## 2019-02-12 ENCOUNTER — Other Ambulatory Visit: Payer: Self-pay | Admitting: Family Medicine

## 2019-02-12 DIAGNOSIS — K219 Gastro-esophageal reflux disease without esophagitis: Secondary | ICD-10-CM

## 2019-02-13 ENCOUNTER — Other Ambulatory Visit: Payer: Self-pay

## 2019-02-13 ENCOUNTER — Ambulatory Visit (INDEPENDENT_AMBULATORY_CARE_PROVIDER_SITE_OTHER): Payer: BC Managed Care – PPO | Admitting: Family Medicine

## 2019-02-13 ENCOUNTER — Encounter: Payer: Self-pay | Admitting: Family Medicine

## 2019-02-13 VITALS — BP 122/84 | HR 98 | Temp 97.1°F | Resp 16 | Ht 72.0 in | Wt 221.0 lb

## 2019-02-13 DIAGNOSIS — G40909 Epilepsy, unspecified, not intractable, without status epilepticus: Secondary | ICD-10-CM

## 2019-02-13 DIAGNOSIS — Z125 Encounter for screening for malignant neoplasm of prostate: Secondary | ICD-10-CM | POA: Diagnosis not present

## 2019-02-13 DIAGNOSIS — Z23 Encounter for immunization: Secondary | ICD-10-CM | POA: Diagnosis not present

## 2019-02-13 DIAGNOSIS — I10 Essential (primary) hypertension: Secondary | ICD-10-CM | POA: Diagnosis not present

## 2019-02-13 DIAGNOSIS — Z1159 Encounter for screening for other viral diseases: Secondary | ICD-10-CM

## 2019-02-13 DIAGNOSIS — Z0000 Encounter for general adult medical examination without abnormal findings: Secondary | ICD-10-CM | POA: Diagnosis not present

## 2019-02-13 NOTE — Patient Instructions (Addendum)
.   Please review the attached list of medications and notify my office if there are any errors.   . Please bring all of your medications to every appointment so we can make sure that our medication list is the same as yours.    It is especially important to get the annual flu vaccine this year. If you haven't had it already, please go to your pharmacy or call the office as soon as possible to schedule you flu shot.   The CDC recommends two doses of Shingrix (the shingles vaccine) separated by 2 to 6 months for adults age 39 years and older. I recommend checking with your insurance plan regarding coverage for this vaccine.    . Please go to the lab draw station in [address] on the second floor of [facility]  when you are fasting for 12 hours. Normal hours are 8:00am to 12:30pm and 1:30pm to 4:00pm Monday through Friday

## 2019-02-13 NOTE — Progress Notes (Signed)
Patient: Dustin Paul, Male    DOB: [birth year], 58 y.o.   MRN: [id number] Visit Date: 02/13/2019  Today's Provider: Lelon Huh, MD   Chief Complaint  Patient presents with  . Annual Exam   Subjective:     Annual physical exam Dustin Paul is a 58 y.o. male who presents today for health maintenance and complete physical. He feels fairly well. He reports exercising 4 times weekly. He reports he is sleeping fairly well.  -----------------------------------------------------------------   Follow up for GERD:  The patient was last seen for this 3 months ago. Changes made at last visit include adding Aciphex.  He reports good compliance with treatment. He feels that condition is Improved. He is not having side effects.   ------------------------------------------------------------------------------------  Hypertension, follow-up:  BP Readings from Last 3 Encounters:  02/13/19 122/84  12/05/18 (!) 129/93  11/20/18 116/80    He was last seen for hypertension 6 months ago.  BP at that visit was 104/60. Management since that visit includes no chnages. He reports good compliance with treatment. He is not having side effects.  He is exercising. He is adherent to low salt diet.   Outside blood pressures are not being checked. He is experiencing none.  Patient denies chest pain, chest pressure/discomfort, claudication, dyspnea, exertional chest pressure/discomfort, fatigue, irregular heart beat, lower extremity edema, near-syncope, orthopnea, palpitations, paroxysmal nocturnal dyspnea, syncope and tachypnea.   Cardiovascular risk factors include hypertension and male gender.  Use of agents associated with hypertension: none.     Weight trend: increasing steadily Wt Readings from Last 3 Encounters:  02/13/19 221 lb (100.2 kg)  12/05/18 214 lb (97.1 kg)  11/20/18 216 lb (98 kg)    Current diet: well balanced   ------------------------------------------------------------------------  Follow up for Seizure:  The patient was last seen for this 6 months ago. Changes made at last visit include none.  He reports good compliance with treatment. He feels that condition is stable. He is not having side effects.   ------------------------------------------------------------------------------------  Review of Systems  Constitutional: Negative for appetite change, chills, fatigue and fever.  HENT: Positive for trouble swallowing. Negative for congestion, ear pain, hearing loss and nosebleeds.   Eyes: Negative for pain and visual disturbance.  Respiratory: Negative for cough, chest tightness and shortness of breath.   Cardiovascular: Negative for chest pain, palpitations and leg swelling.  Gastrointestinal: Negative for abdominal pain, blood in stool, constipation, diarrhea, nausea and vomiting.  Endocrine: Negative for polydipsia, polyphagia and polyuria.  Genitourinary: Negative for dysuria and flank pain.  Musculoskeletal: Negative for arthralgias, back pain, joint swelling, myalgias and neck stiffness.  Skin: Negative for color change, rash and wound.  Neurological: Negative for dizziness, tremors, seizures, speech difficulty, weakness, light-headedness and headaches.  Psychiatric/Behavioral: Negative for behavioral problems, confusion, decreased concentration, dysphoric mood and sleep disturbance. The patient is not nervous/anxious.   All other systems reviewed and are negative.   Social History      He  reports that he has never smoked. He has never used smokeless tobacco. He reports that he does not drink alcohol or use drugs.       Social History   Socioeconomic History  . Marital status: Married    Spouse name: Not on file  . Number of children: 2  . Years of education: Not on file  . Highest education level: Bachelor's degree (e.g., BA, AB, BS)  Occupational History  . Occupation:  engineer/retired  Social Needs  .  Financial resource strain: Not hard at all  . Food insecurity    Worry: Never true    Inability: Never true  . Transportation needs    Medical: No    Non-medical: No  Tobacco Use  . Smoking status: Never Smoker  . Smokeless tobacco: Never Used  Substance and Sexual Activity  . Alcohol use: No    Alcohol/week: 0.0 standard drinks    Comment: 1-2 drinks a month  . Drug use: No  . Sexual activity: Not Currently  Lifestyle  . Physical activity    Days per week: Not on file    Minutes per session: Not on file  . Stress: Not at all  Relationships  . Social Herbalist on phone: Not on file    Gets together: Not on file    Attends religious service: Not on file    Active member of club or organization: Not on file    Attends meetings of clubs or organizations: Not on file    Relationship status: Not on file  Other Topics Concern  . Not on file  Social History Narrative  . Not on file    Past Medical History:  Diagnosis Date  . Aneurysm of artery ([place]) 2011   LEFT (19 MM) AND RIGHT (11 MM) middle cerebral  . Chronic kidney disease    H/O STONES  . Complication of anesthesia   . Headache    MIGRAINES  . Insomnia   . Lumbar stress fracture   . PONV (postoperative nausea and vomiting)    AFTER KNEE SURGERY IN 1980'S  . Seasonal allergies   . Seizures ([place])    last seizure in 2015  . Visual hallucinations      Patient Active Problem List   Diagnosis Date Noted  . History of adenomatous polyp of colon 01/21/2015  . Chronic headache 12/05/2014  . Compression fracture of lumbar vertebra ([place]) 12/05/2014  . DD (diverticular disease) 12/05/2014  . Hallucination 12/05/2014  . Initial insomnia 12/05/2014  . Closed fracture of lumbar vertebra ([place]) 12/05/2014  . Diverticular disease of large intestine 12/05/2014  . Abdominal pain 11/22/2014  . Injury of kidney 08/18/2013  . Disturbance, visual, psychophysical 08/16/2013  .  Episodic memory loss 06/07/2013  . Memory loss, short term 01/29/2013  . Blurred vision 06/21/2012  . Cephalalgia 06/01/2012  . Cannot sleep 06/01/2012  . Seizure disorder ([place]) 10/21/2011  . Cardiac arrhythmia 03/12/2010  . Cardiac conduction disorder 03/12/2010  . Balloon like swelling of an artery of the brain 02/14/2010  . Calculus of kidney 10/22/2008  . Sleep arousal disorder 08/19/2008  . Family history of cancer of digestive system 03/27/2006  . Essential (primary) hypertension 03/23/2006    Past Surgical History:  Procedure Laterality Date  . BRAIN SURGERY  2011   2 ANEURYSMS-TOTAL OF 9 CLIPS IN BRAIN FROM ANEURYSM  . COLONOSCOPY WITH PROPOFOL N/A 01/16/2015   Procedure: COLONOSCOPY WITH PROPOFOL;  Surgeon: Hulen Luster, MD;  Location[facility] ENDOSCOPY;  Service: Gastroenterology;  Laterality: N/A;  . ESOPHAGOGASTRODUODENOSCOPY (EGD) WITH PROPOFOL N/A 12/05/2018   Procedure: ESOPHAGOGASTRODUODENOSCOPY (EGD) WITH PROPOFOL;  Surgeon: Toledo, Benay Pike, MD;  Location: [facility] ENDOSCOPY;  Service: Gastroenterology;  Laterality: N/A;  . KNEE ARTHROSCOPY WITH MEDIAL MENISECTOMY Left 11/20/2015   Procedure: KNEE ARTHROSCOPY WITH partial MEDIAL MENISECTOMY / WITH REMOVAL OF LOOSE BODY;  Surgeon: Hessie Knows, MD;  Location: [facility] ORS;  Service: Orthopedics;  Laterality: Left;  . KNEE SURGERY Left  Family History        Family Status  Relation Name Status  . Mother  Alive  . Sister  Deceased at age 65       MVA  . Father  Alive       does not have contact with         His family history is not on file.      Allergies  Allergen Reactions  . Aspirin     Nose bleed, thin blood  . Levetiracetam     Racing thoughts per [facility] neurology note 03-11-11     Current Outpatient Medications:  .  Alpha-Lipoic Acid 300 MG CAPS, Take 300 mg by mouth every evening. Reported on 09/24/2015, Disp: , Rfl:  .  ARIPiprazole (ABILIFY) 5 MG tablet, Take 1 tablet (5 mg total) by mouth daily., Disp: 90  tablet, Rfl: 2 .  cyclobenzaprine (FLEXERIL) 10 MG tablet, Take 10 mg by mouth 3 (three) times daily as needed. Reported on 05/15/2015, Disp: , Rfl:  .  famotidine (PEPCID) 20 MG tablet, Take 20 mg by mouth 2 (two) times daily., Disp: , Rfl:  .  famotidine-calcium carbonate-magnesium hydroxide (PEPCID COMPLETE) 10-800-165 MG chewable tablet, Chew 1 tablet by mouth daily as needed., Disp: , Rfl:  .  lamoTRIgine (LAMICTAL) 150 MG tablet, Take 1 tablet (150 mg total) by mouth 2 (two) times daily., Disp: 180 tablet, Rfl: 2 .  lisinopril (PRINIVIL,ZESTRIL) 5 MG tablet, TAKE 1 TABLET BY MOUTH DAILY, Disp: 90 tablet, Rfl: 3 .  Melatonin 10 MG CAPS, Take 1 capsule by mouth at bedtime., Disp: , Rfl:  .  mirtazapine (REMERON) 15 MG tablet, Take 1 tablet (15 mg total) by mouth at bedtime., Disp: 90 tablet, Rfl: 2 .  QUDEXY XR 150 MG CS24, take 1 capsule by mouth daily at bedtime, Disp: , Rfl: 11 .  RABEprazole (ACIPHEX) 20 MG tablet, TAKE 1 TABLET BY MOUTH DAILY, Disp: 30 tablet, Rfl: 5 .  rizatriptan (MAXALT) 5 MG tablet, Take 5 mg by mouth as needed for migraine. May repeat in 2 hours if needed, Disp: , Rfl:  .  sildenafil (REVATIO) 20 MG tablet, 2-5 tabs 1 hour prior to intercourse, Disp: 120 tablet, Rfl: 3 .  traZODone (DESYREL) 150 MG tablet, Take 1 tablet (150 mg total) by mouth at bedtime., Disp: 90 tablet, Rfl: 2   Patient Care Team: Birdie Sons, MD as PCP - General (Family Medicine) Agapito Games as Consulting Physician (Optometry) Lavona Mound, Gerri Lins, MD as Referring Physician (Psychiatry) Wyvonne Lenz, DO as Referring Physician (Neurology) Rainey Pines, MD as Consulting Physician (Psychiatry) Efrain Sella, MD as Consulting Physician (Gastroenterology)    Objective:    Vitals: BP 122/84 (BP Location: Left Arm, Patient Position: Sitting, Cuff Size: Large)   Pulse 98   Temp (!) 97.1 F (36.2 C) (Temporal)   Resp 16   Ht 6' (1.829 m)   Wt 221 lb (100.2 kg)   SpO2 97%  Comment: room air  BMI 29.97 kg/m    Vitals:   02/13/19 1355  BP: 122/84  Pulse: 98  Resp: 16  Temp: (!) 97.1 F (36.2 C)  TempSrc: Temporal  SpO2: 97%  Weight: 221 lb (100.2 kg)  Height: 6' (1.829 m)     Physical Exam   General Appearance:    Alert, cooperative, no distress, appears stated age  Head:    Normocephalic, without obvious abnormality, atraumatic  Eyes:    PERRL, conjunctiva/corneas clear, EOM's intact,  fundi    benign, both eyes       Ears:    Normal TM's and external ear canals, both ears  Nose:   Nares normal, septum midline, mucosa normal, no drainage   or sinus tenderness  Throat:   Lips, mucosa, and tongue normal; teeth and gums normal  Neck:   Supple, symmetrical, trachea midline, no adenopathy;       thyroid:  No enlargement/tenderness/nodules; no carotid   bruit or JVD  Back:     Symmetric, no curvature, ROM normal, no CVA tenderness  Lungs:     Clear to auscultation bilaterally, respirations unlabored  Chest wall:    No tenderness or deformity  Heart:    Normal heart rate. Normal rhythm. No murmurs, rubs, or gallops.  S1 and S2 normal  Abdomen:     Soft, non-tender, bowel sounds active all four quadrants,    no masses, no organomegaly  Genitalia:    deferred  Rectal:    deferred  Extremities:   All extremities are intact. No cyanosis or edema  Pulses:   2+ and symmetric all extremities  Skin:   Skin color, texture, turgor normal, no rashes or lesions  Lymph nodes:   Cervical, supraclavicular, and axillary nodes normal  Neurologic:   CNII-XII intact. Normal strength, sensation and reflexes      throughout    Depression Screen PHQ 2/9 Scores 02/13/2019 02/13/2019 08/16/2017 08/10/2016  PHQ - 2 Score 0 0 0 0  PHQ- 9 Score 1 - - 2       Assessment & Plan:     Routine Health Maintenance and Physical Exam  Exercise Activities and Dietary recommendations Goals   None     Immunization History  Administered Date(s) Administered  . Td  10/12/1996  . Tdap 07/12/2007, 02/13/2019    Health Maintenance  Topic Date Due  . Hepatitis C Screening  07/15/1960  . HIV Screening  02/09/1976  . TETANUS/TDAP  07/11/2017  . INFLUENZA VACCINE  08/29/2019 (Originally 12/30/2018)  . COLONOSCOPY  01/16/2020     Discussed health benefits of physical activity, and encouraged him to engage in regular exercise appropriate for his age and condition.    -------------------------------------------------------------------- 1. Annual physical exam He wants to wait until October for flu vaccine. Recommend he also check insurance regarding coverage for Shingrix.  - Comprehensive metabolic panel - Lipid panel - CBC with Differential/Platelet  2. Essential (primary) hypertension Well controlled.  Continue current medications.    3. Seizure disorder ([place]) No seizures for many years on current medication regiment.  - Comprehensive metabolic panel - CBC with Differential/Platelet  4. Prostate cancer screening  - PSA  5. Need for hepatitis C screening test  - Hepatitis C antibody  6. Need for Tdap vaccination  - Tdap vaccine greater than or equal to 7yo IM   The entirety of the information documented in the History of Present Illness, Review of Systems and Physical Exam were personally obtained by me. Portions of this information were initially documented by Meyer Cory, CMA and reviewed by me for thoroughness and accuracy.    Lelon Huh, MD  [facility]

## 2019-02-16 DIAGNOSIS — Z1159 Encounter for screening for other viral diseases: Secondary | ICD-10-CM | POA: Diagnosis not present

## 2019-02-16 DIAGNOSIS — Z0000 Encounter for general adult medical examination without abnormal findings: Secondary | ICD-10-CM | POA: Diagnosis not present

## 2019-02-16 DIAGNOSIS — G40909 Epilepsy, unspecified, not intractable, without status epilepticus: Secondary | ICD-10-CM | POA: Diagnosis not present

## 2019-02-16 DIAGNOSIS — Z125 Encounter for screening for malignant neoplasm of prostate: Secondary | ICD-10-CM | POA: Diagnosis not present

## 2019-02-17 LAB — CBC WITH DIFFERENTIAL/PLATELET
Basophils Absolute: 0.1 10*3/uL (ref 0.0–0.2)
Basos: 1 %
EOS (ABSOLUTE): 0.2 10*3/uL (ref 0.0–0.4)
Eos: 2 %
Hematocrit: 47.4 % (ref 37.5–51.0)
Hemoglobin: 16.3 g/dL (ref 13.0–17.7)
Immature Grans (Abs): 0 10*3/uL (ref 0.0–0.1)
Immature Granulocytes: 1 %
Lymphocytes Absolute: 2.3 10*3/uL (ref 0.7–3.1)
Lymphs: 36 %
MCH: 30.4 pg (ref 26.6–33.0)
MCHC: 34.4 g/dL (ref 31.5–35.7)
MCV: 88 fL (ref 79–97)
Monocytes Absolute: 0.6 10*3/uL (ref 0.1–0.9)
Monocytes: 10 %
Neutrophils Absolute: 3.2 10*3/uL (ref 1.4–7.0)
Neutrophils: 50 %
Platelets: 220 10*3/uL (ref 150–450)
RBC: 5.36 x10E6/uL (ref 4.14–5.80)
RDW: 13.3 % (ref 11.6–15.4)
WBC: 6.3 10*3/uL (ref 3.4–10.8)

## 2019-02-17 LAB — COMPREHENSIVE METABOLIC PANEL
ALT: 29 IU/L (ref 0–44)
AST: 23 IU/L (ref 0–40)
Albumin/Globulin Ratio: 1.9 (ref 1.2–2.2)
Albumin: 4.6 g/dL (ref 3.8–4.9)
Alkaline Phosphatase: 54 IU/L (ref 39–117)
BUN/Creatinine Ratio: 12 (ref 9–20)
BUN: 17 mg/dL (ref 6–24)
Bilirubin Total: <0.2 mg/dL (ref 0.0–1.2)
CO2: 19 mmol/L — ABNORMAL LOW (ref 20–29)
Calcium: 9.9 mg/dL (ref 8.7–10.2)
Chloride: 104 mmol/L (ref 96–106)
Creatinine, Ser: 1.44 mg/dL — ABNORMAL HIGH (ref 0.76–1.27)
GFR calc Af Amer: 61 mL/min/{1.73_m2} (ref >59)
GFR calc non Af Amer: 53 mL/min/{1.73_m2} — ABNORMAL LOW (ref >59)
Globulin, Total: 2.4 g/dL (ref 1.5–4.5)
Glucose: 102 mg/dL — ABNORMAL HIGH (ref 65–99)
Potassium: 4.8 mmol/L (ref 3.5–5.2)
Sodium: 141 mmol/L (ref 134–144)
Total Protein: 7 g/dL (ref 6.0–8.5)

## 2019-02-17 LAB — LIPID PANEL
Chol/HDL Ratio: 4.4 ratio (ref 0.0–5.0)
Cholesterol, Total: 149 mg/dL (ref 100–199)
HDL: 34 mg/dL — ABNORMAL LOW (ref >39)
LDL Chol Calc (NIH): 81 mg/dL (ref 0–99)
Triglycerides: 202 mg/dL — ABNORMAL HIGH (ref 0–149)
VLDL Cholesterol Cal: 34 mg/dL (ref 5–40)

## 2019-02-17 LAB — PSA: Prostate Specific Ag, Serum: 2.9 ng/mL (ref 0.0–4.0)

## 2019-02-17 LAB — HEPATITIS C ANTIBODY: Hep C Virus Ab: <0.1 s/co ratio (ref 0.0–0.9)

## 2019-02-26 ENCOUNTER — Telehealth: Payer: Self-pay

## 2019-02-26 NOTE — Telephone Encounter (Signed)
Pt dropped off physical form to be completed. Form placed in provider box. Thanks TNP

## 2019-02-28 ENCOUNTER — Telehealth: Payer: Self-pay

## 2019-02-28 NOTE — Telephone Encounter (Signed)
Tried calling patient to advise him that the form he dropped off has been completed, but requires his signature before we can fax it. Left message to call back. Please advise patient that he needs to sign this form before it can be faxed. Form is on my desk .

## 2019-02-28 NOTE — Telephone Encounter (Signed)
Patient advised. Per patients request: Original copy placed up front for pick up. Patient will sign and then mail form in himself.

## 2019-03-27 ENCOUNTER — Other Ambulatory Visit: Payer: Self-pay | Admitting: Urology

## 2019-04-09 ENCOUNTER — Ambulatory Visit: Payer: Medicare Other | Admitting: Psychiatry

## 2019-04-29 ENCOUNTER — Other Ambulatory Visit: Payer: Self-pay | Admitting: Urology

## 2019-04-30 ENCOUNTER — Ambulatory Visit: Payer: Medicare Other | Admitting: Psychiatry

## 2019-05-03 ENCOUNTER — Other Ambulatory Visit: Payer: Self-pay

## 2019-05-03 ENCOUNTER — Ambulatory Visit: Payer: Medicare Other | Admitting: Psychiatry

## 2019-05-14 ENCOUNTER — Other Ambulatory Visit: Payer: Self-pay | Admitting: Urology

## 2019-05-14 DIAGNOSIS — N200 Calculus of kidney: Secondary | ICD-10-CM

## 2019-05-16 ENCOUNTER — Other Ambulatory Visit: Payer: Self-pay | Admitting: Urology

## 2019-05-17 ENCOUNTER — Ambulatory Visit (INDEPENDENT_AMBULATORY_CARE_PROVIDER_SITE_OTHER): Payer: Medicare Other | Admitting: Adult Health

## 2019-05-17 ENCOUNTER — Other Ambulatory Visit: Payer: Self-pay

## 2019-05-17 ENCOUNTER — Encounter: Payer: Self-pay | Admitting: Adult Health

## 2019-05-17 DIAGNOSIS — J069 Acute upper respiratory infection, unspecified: Secondary | ICD-10-CM

## 2019-05-17 DIAGNOSIS — Z20828 Contact with and (suspected) exposure to other viral communicable diseases: Secondary | ICD-10-CM | POA: Diagnosis not present

## 2019-05-17 DIAGNOSIS — Z20822 Contact with and (suspected) exposure to covid-19: Secondary | ICD-10-CM

## 2019-05-17 MED ORDER — FLUTICASONE PROPIONATE 50 MCG/ACT NA SUSP: 2.0000 | Freq: Every day | NASAL | 0 refills | Status: DC

## 2019-05-17 MED ORDER — BENZONATATE 100 MG PO CAPS: 100.0000 mg | ORAL_CAPSULE | Freq: Two times a day (BID) | ORAL | 0 refills | Status: DC | PRN

## 2019-05-17 MED ORDER — AMOXICILLIN 500 MG PO CAPS: 500.0000 mg | ORAL_CAPSULE | Freq: Three times a day (TID) | ORAL | 0 refills | Status: DC

## 2019-05-17 NOTE — Progress Notes (Addendum)
Patient: Dustin Paul Male    DOB: August 17, 1960   58 y.o.   MRN: [id number] Visit Date: 05/17/2019  Today's Provider: Marcille Buffy, FNP   Chief Complaint  Patient presents with  . Sinus Problem   Subjective:    Virtual Visit via Video Note  I connected with Dustin Paul on 05/17/19 at  8:00 AM EST by a video enabled telemedicine application and verified that I am speaking with the correct person using two identifiers.  Location: Patient: at home  Provider: Provider: Provider's office at  [facility], [place].      I discussed the limitations of evaluation and management by telemedicine and the availability of in person appointments. The patient expressed understanding and agreed to proceed.    I discussed the assessment and treatment plan with the patient. The patient was provided an opportunity to ask questions and all were answered. The patient agreed with the plan and demonstrated an understanding of the instructions.   The patient was advised to call back or seek an in-person evaluation if the symptoms worsen or if the condition fails to improve as anticipated.  I provided 15 minutes of non-face-to-face time during this encounter.    Sinus Problem This is a new problem. The current episode started in the past 7 days. The problem is unchanged. There has been no fever. Associated symptoms include congestion, coughing, sinus pressure, sneezing and a sore throat. Pertinent negatives include no chills, diaphoresis, ear pain, headaches, hoarse voice, neck pain, shortness of breath or swollen glands. Treatments tried: Tylenol Sinus & Cold, otc cough syrup with DM. The treatment provided no relief.    He reports symptoms present over one week, getting worse nasal congestion, post nasal drip cough. Noticed worse after blowing leaves the other week. Taking allegra daily. No nasal sprays. No history of known covid exposures. Reports  working from home.  Denies cough, chest congestion. Mild sinus pressure.  Has tried Mucinex- helping clear congestion, thick mucous in nose.  Patient  denies any fever, body aches,chills, rash, chest pain, shortness of breath, nausea, vomiting, or diarrhea.    Allergies  Allergen Reactions  . Aspirin     Nose bleed, thin blood  . Levetiracetam     Racing thoughts per [facility] neurology note 03-11-11     Current Outpatient Medications:  .  Alpha-Lipoic Acid 300 MG CAPS, Take 300 mg by mouth every evening. Reported on 09/24/2015, Disp: , Rfl:  .  ARIPiprazole (ABILIFY) 5 MG tablet, Take 1 tablet (5 mg total) by mouth daily., Disp: 90 tablet, Rfl: 2 .  cyclobenzaprine (FLEXERIL) 10 MG tablet, Take 10 mg by mouth 3 (three) times daily as needed. Reported on 05/15/2015, Disp: , Rfl:  .  famotidine-calcium carbonate-magnesium hydroxide (PEPCID COMPLETE) 10-800-165 MG chewable tablet, Chew 1 tablet by mouth daily as needed., Disp: , Rfl:  .  lamoTRIgine (LAMICTAL) 150 MG tablet, Take 1 tablet (150 mg total) by mouth 2 (two) times daily., Disp: 180 tablet, Rfl: 2 .  lisinopril (PRINIVIL,ZESTRIL) 5 MG tablet, TAKE 1 TABLET BY MOUTH DAILY, Disp: 90 tablet, Rfl: 3 .  Melatonin 10 MG CAPS, Take 1 capsule by mouth at bedtime., Disp: , Rfl:  .  mirtazapine (REMERON) 15 MG tablet, Take 1 tablet (15 mg total) by mouth at bedtime., Disp: 90 tablet, Rfl: 2 .  RABEprazole (ACIPHEX) 20 MG tablet, TAKE 1 TABLET BY MOUTH DAILY, Disp: 30 tablet, Rfl: 5 .  rizatriptan (  MAXALT) 5 MG tablet, Take 5 mg by mouth as needed for migraine. May repeat in 2 hours if needed, Disp: , Rfl:  .  sildenafil (REVATIO) 20 MG tablet, 2-5 tabs 1 hour prior to intercourse, Disp: 120 tablet, Rfl: 3 .  traZODone (DESYREL) 150 MG tablet, Take 1 tablet (150 mg total) by mouth at bedtime., Disp: 90 tablet, Rfl: 2 .  QUDEXY XR 150 MG CS24, take 1 capsule by mouth daily at bedtime, Disp: , Rfl: 11  Review of Systems  Constitutional: Positive  for fatigue. Negative for activity change, appetite change, chills, diaphoresis, fever and unexpected weight change.  HENT: Positive for congestion, postnasal drip, rhinorrhea, sinus pressure, sneezing and sore throat. Negative for dental problem, drooling, ear discharge, ear pain, facial swelling, hearing loss, hoarse voice, mouth sores, nosebleeds, sinus pain, tinnitus, trouble swallowing and voice change.   Eyes: Negative.   Respiratory: Positive for cough. Negative for apnea, choking, chest tightness, shortness of breath, wheezing and stridor.   Cardiovascular: Negative.  Negative for chest pain, palpitations and leg swelling.  Gastrointestinal: Negative.   Genitourinary: Negative.   Musculoskeletal: Negative.  Negative for neck pain.  Skin: Negative for color change, pallor, rash and wound.  Neurological: Negative for dizziness, seizures, syncope, light-headedness and headaches.  Psychiatric/Behavioral: Negative.     Social History   Tobacco Use  . Smoking status: Never Smoker  . Smokeless tobacco: Never Used  Substance Use Topics  . Alcohol use: No    Alcohol/week: 0.0 standard drinks    Comment: 1-2 drinks a month      Objective:   There were no vitals taken for this visit. There were no vitals filed for this visit.There is no height or weight on file to calculate BMI.   Physical Exam  Patient is alert and oriented and responsive to questions Engages in conversation with provider. Speaks in full sentences without any pauses without any shortness of breath or distress.   No vital signs available.   No results found for any visits on 05/17/19.     Assessment & Plan       1. Suspected COVID-19 virus infection  Information given to schedule testing at [facility]. He agrees to get tested.   2. Upper respiratory infection, acute  Cough and sinus drainage worsening. Recommended Augmentin 875 mg patinet declines due to GI upset. Discussed rationale. Patient prefers  Amoxicililn 500mg  instead.  Discussed possible resistance.   Meds ordered this encounter  Medications  . amoxicillin (AMOXIL) 500 MG capsule    Sig: Take 1 capsule (500 mg total) by mouth 3 (three) times daily.    Dispense:  30 capsule    Refill:  0  . benzonatate (TESSALON) 100 MG capsule    Sig: Take 1 capsule (100 mg total) by mouth 2 (two) times daily as needed for cough.    Dispense:  20 capsule    Refill:  0  . fluticasone (FLONASE) 50 MCG/ACT nasal spray    Sig: Place 2 sprays into both nostrils daily.    Dispense:  16 g    Refill:  0   Discussed above medications and how to use. He verbalizes understanding of side effects. He agrees to see in person visit if no improvement.   Advised patient call the office or your primary care doctor for an appointment if no improvement within 72 hours or if any symptoms change or worsen at any time  Advised ER or urgent Care if after hours or on weekend.  Call 911 for emergency symptoms at any time.Patinet verbalized understanding of all instructions given/reviewed and treatment plan and has no further questions or concerns at this time.      The entirety of the information documented in the History of Present Illness, Review of Systems and Physical Exam were personally obtained by me. Portions of this information were initially documented by the  Certified Medical Assistant whose name is documented in [facility] and reviewed by me for thoroughness and accuracy.  I have personally performed the exam and reviewed the chart and it is accurate to the best of my knowledge.  Haematologist has been used and any errors in dictation or transcription are unintentional.  Kelby Aline. [place], [facility]

## 2019-05-17 NOTE — Patient Instructions (Signed)
Upper Respiratory Infection, Adult An upper respiratory infection (URI) affects the nose, throat, and upper air passages. URIs are caused by germs (viruses). The most common type of URI is often called "the common cold." Medicines cannot cure URIs, but you can do things at home to relieve your symptoms. URIs usually get better within 7-10 days. Follow these instructions at home: Activity  Rest as needed.  If you have a fever, stay home from work or school until your fever is gone, or until your doctor says you may return to work or school. ? You should stay home until you cannot spread the infection anymore (you are not contagious). ? Your doctor may have you wear a face mask so you have less risk of spreading the infection. Relieving symptoms  Gargle with a salt-water mixture 3-4 times a day or as needed. To make a salt-water mixture, completely dissolve -1 tsp of salt in 1 cup of warm water.  Use a cool-mist humidifier to add moisture to the air. This can help you breathe more easily. Eating and drinking   Drink enough fluid to keep your pee (urine) pale yellow.  Eat soups and other clear broths. General instructions   Take over-the-counter and prescription medicines only as told by your doctor. These include cold medicines, fever reducers, and cough suppressants.  Do not use any products that contain nicotine or tobacco. These include cigarettes and e-cigarettes. If you need help quitting, ask your doctor.  Avoid being where people are smoking (avoid secondhand smoke).  Make sure you get regular shots and get the flu shot every year.  Keep all follow-up visits as told by your doctor. This is important. How to avoid spreading infection to others   Wash your hands often with soap and water. If you do not have soap and water, use hand sanitizer.  Avoid touching your mouth, face, eyes, or nose.  Cough or sneeze into a tissue or your sleeve or elbow. Do not cough or sneeze  into your hand or into the air. Contact a doctor if:  You are getting worse, not better.  You have any of these: ? A fever. ? Chills. ? Brown or red mucus in your nose. ? Yellow or brown fluid (discharge)coming from your nose. ? Pain in your face, especially when you bend forward. ? Swollen neck glands. ? Pain with swallowing. ? White areas in the back of your throat. Get help right away if:  You have shortness of breath that gets worse.  You have very bad or constant: ? Headache. ? Ear pain. ? Pain in your forehead, behind your eyes, and over your cheekbones (sinus pain). ? Chest pain.  You have long-lasting (chronic) lung disease along with any of these: ? Wheezing. ? Long-lasting cough. ? Coughing up blood. ? A change in your usual mucus.  You have a stiff neck.  You have changes in your: ? Vision. ? Hearing. ? Thinking. ? Mood. Summary  An upper respiratory infection (URI) is caused by a germ called a virus. The most common type of URI is often called "the common cold."  URIs usually get better within 7-10 days.  Take over-the-counter and prescription medicines only as told by your doctor. This information is not intended to replace advice given to you by your health care provider. Make sure you discuss any questions you have with your health care provider. Document Released: 11/03/2007 Document Revised: 05/25/2018 Document Reviewed: 01/07/2017 Elsevier Patient Education  2020 [employer]. COVID-19:  How to Protect Yourself and Others Know how it spreads  There is currently no vaccine to prevent coronavirus disease 2019 (COVID-19).  The best way to prevent illness is to avoid being exposed to this virus.  The virus is thought to spread mainly from person-to-person. ? Between people who are in close contact with one another (within about 6 feet). ? Through respiratory droplets produced when an infected person coughs, sneezes or talks. ? These droplets can  land in the mouths or noses of people who are nearby or possibly be inhaled into the lungs. ? Some recent studies have suggested that COVID-19 may be spread by people who are not showing symptoms. Everyone should Clean your hands often  Wash your hands often with soap and water for at least 20 seconds especially after you have been in a public place, or after blowing your nose, coughing, or sneezing.  If soap and water are not readily available, use a hand sanitizer that contains at least 60% alcohol. Cover all surfaces of your hands and rub them together until they feel dry.  Avoid touching your eyes, nose, and mouth with unwashed hands. Avoid close contact  Stay home if you are sick.  Avoid close contact with people who are sick.  Put distance between yourself and other people. ? Remember that some people without symptoms may be able to spread virus. ? This is especially important for people who are at higher risk of getting very [employer]ain[employer]com.cy Cover your mouth and nose with a cloth face cover when around others  You could spread COVID-19 to others even if you do not feel sick.  Everyone should wear a cloth face cover when they have to go out in public, for example to the grocery store or to pick up other necessities. ? Cloth face coverings should not be placed on young children under age 26, anyone who has trouble breathing, or is unconscious, incapacitated or otherwise unable to remove the mask without assistance.  The cloth face cover is meant to protect other people in case you are infected.  Do NOT use a facemask meant for a Dietitian.  Continue to keep about 6 feet between yourself and others. The cloth face cover is not a substitute for social distancing. Cover coughs and sneezes  If you are in a private setting and do not have on your cloth face covering, remember to always cover your mouth and  nose with a tissue when you cough or sneeze or use the inside of your elbow.  Throw used tissues in the trash.  Immediately wash your hands with soap and water for at least 20 seconds. If soap and water are not readily available, clean your hands with a hand sanitizer that contains at least 60% alcohol. Clean and disinfect  Clean AND disinfect frequently touched surfaces daily. This includes tables, doorknobs, light switches, countertops, handles, desks, phones, keyboards, toilets, faucets, and sinks. [employer]  If surfaces are dirty, clean them: Use detergent or soap and water prior to disinfection.  Then, use a household disinfectant. You can see a list of EPA-registered household disinfectants here. michellinders.com 10/03/2018 This information is not intended to replace advice given to you by your health care provider. Make sure you discuss any questions you have with your health care provider. Document Released: 09/12/2018 Document Revised: 10/11/2018 Document Reviewed: 09/12/2018 Elsevier Patient Education  [place].

## 2019-05-26 ENCOUNTER — Other Ambulatory Visit: Payer: Self-pay | Admitting: Urology

## 2019-05-28 ENCOUNTER — Telehealth: Payer: Self-pay

## 2019-05-28 DIAGNOSIS — Z20828 Contact with and (suspected) exposure to other viral communicable diseases: Secondary | ICD-10-CM | POA: Diagnosis not present

## 2019-05-28 NOTE — Telephone Encounter (Signed)
Pt stated he wanted to come into the office to be evaluated.  I advised him with his symptoms he would need to go to an Urgent Care.  Pt agreed and will go.   Thanks,   -Mickel Baas

## 2019-05-28 NOTE — Telephone Encounter (Signed)
Dr. Caryn Section is out this week.  Left message advising pt to set up a virtual visit with another provider.  Please schedule when pt calls back.  Thanks,   Mickel Baas    Copied from [place] [id number]. Topic: Appointment Scheduling - Scheduling Inquiry for Clinic >> May 28, 2019 11:00 AM Dustin Paul wrote: Reason for CRM: pt had a doxy me appt with a PA a couple weeks ago for cough and congestion, He was given medications and he has finished the medications but he still has cough and wants to know if he can see Dr. Caryn Section.  Chest feels tight especially on the right side.  CB#   [phone]

## 2019-06-04 ENCOUNTER — Other Ambulatory Visit: Payer: Self-pay | Admitting: Urology

## 2019-06-05 ENCOUNTER — Other Ambulatory Visit: Payer: Self-pay | Admitting: Urology

## 2019-06-12 ENCOUNTER — Encounter: Payer: Self-pay | Admitting: Psychiatry

## 2019-06-12 ENCOUNTER — Other Ambulatory Visit: Payer: Self-pay

## 2019-06-12 ENCOUNTER — Ambulatory Visit (INDEPENDENT_AMBULATORY_CARE_PROVIDER_SITE_OTHER): Payer: BC Managed Care – PPO | Admitting: Psychiatry

## 2019-06-12 DIAGNOSIS — G40909 Epilepsy, unspecified, not intractable, without status epilepticus: Secondary | ICD-10-CM

## 2019-06-12 DIAGNOSIS — F5105 Insomnia due to other mental disorder: Secondary | ICD-10-CM

## 2019-06-12 DIAGNOSIS — F39 Unspecified mood [affective] disorder: Secondary | ICD-10-CM

## 2019-06-12 MED ORDER — MIRTAZAPINE 15 MG PO TABS: 15.0000 mg | ORAL_TABLET | Freq: Every day | ORAL | 1 refills | Status: DC

## 2019-06-12 MED ORDER — TRAZODONE HCL 150 MG PO TABS: 150.0000 mg | ORAL_TABLET | Freq: Every day | ORAL | 1 refills | Status: DC

## 2019-06-12 MED ORDER — LAMOTRIGINE 150 MG PO TABS: 150.0000 mg | ORAL_TABLET | Freq: Two times a day (BID) | ORAL | 1 refills | Status: DC

## 2019-06-12 NOTE — Progress Notes (Signed)
[facility] MD OP Progress Note  I connected with  Dustin Paul on 06/12/19 by a video enabled telemedicine application and verified that I am speaking with the correct person using two identifiers.   I discussed the limitations of evaluation and management by telemedicine. The patient expressed understanding and agreed to proceed.    06/12/2019 9:32 AM Dustin Paul  MRN:  [id number]  Chief Complaint: " I am doing pretty good."  HPI: Pt reported that he has been doing well on his current medications. He is no longer taking Abilify. He stated that he recalls it was to help with hallucinations and after his last visit in August with Dr.Faheem he had discussed the possibility of getting off of it. He stopped taking it in fall. He has not had any hallucinations since then. He is still continuing to take Lamictal 150 mg BID, Mirtazapine 15 mg HS, Trazodone 150 mg HS. He denied any acute concerns or issues at this time.  Visit Diagnosis:    ICD-10-CM   1. Episodic mood disorder (HCC)  F39 mirtazapine (REMERON) 15 MG tablet    lamoTRIgine (LAMICTAL) 150 MG tablet  2. Insomnia due to mental condition  F51.05 traZODone (DESYREL) 150 MG tablet    Past Psychiatric History: Mood d/o, insomnia  Past Medical History:  Past Medical History:  Diagnosis Date  . Aneurysm of artery ([place]) 2011   LEFT (19 MM) AND RIGHT (11 MM) middle cerebral  . Chronic kidney disease    H/O STONES  . Complication of anesthesia   . Headache    MIGRAINES  . Insomnia   . Lumbar stress fracture   . PONV (postoperative nausea and vomiting)    AFTER KNEE SURGERY IN 1980'S  . Seasonal allergies   . Seizures ([place])    last seizure in 2015  . Visual hallucinations     Past Surgical History:  Procedure Laterality Date  . BRAIN SURGERY  2011   2 ANEURYSMS-TOTAL OF 9 CLIPS IN BRAIN FROM ANEURYSM  . COLONOSCOPY WITH PROPOFOL N/A 01/16/2015   Procedure: COLONOSCOPY WITH PROPOFOL;  Surgeon: Hulen Luster, MD;  Location:  [facility] ENDOSCOPY;  Service: Gastroenterology;  Laterality: N/A;  . ESOPHAGOGASTRODUODENOSCOPY (EGD) WITH PROPOFOL N/A 12/05/2018   Procedure: ESOPHAGOGASTRODUODENOSCOPY (EGD) WITH PROPOFOL;  Surgeon: Toledo, Benay Pike, MD;  Location: [facility] ENDOSCOPY;  Service: Gastroenterology;  Laterality: N/A;  . KNEE ARTHROSCOPY WITH MEDIAL MENISECTOMY Left 11/20/2015   Procedure: KNEE ARTHROSCOPY WITH partial MEDIAL MENISECTOMY / WITH REMOVAL OF LOOSE BODY;  Surgeon: Hessie Knows, MD;  Location: [facility] ORS;  Service: Orthopedics;  Laterality: Left;  . KNEE SURGERY Left     Family Psychiatric History: denied  Family History: No family history on file.  Social History:  Social History   Socioeconomic History  . Marital status: Married    Spouse name: Not on file  . Number of children: 2  . Years of education: Not on file  . Highest education level: Bachelor's degree (e.g., BA, AB, BS)  Occupational History  . Occupation: engineer/retired  Tobacco Use  . Smoking status: Never Smoker  . Smokeless tobacco: Never Used  Substance and Sexual Activity  . Alcohol use: No    Alcohol/week: 0.0 standard drinks    Comment: 1-2 drinks a month  . Drug use: No  . Sexual activity: Not Currently  Other Topics Concern  . Not on file  Social History Narrative  . Not on file   Social Determinants of Health   Financial Resource  Strain:   . Difficulty of Paying Living Expenses: Not on file  Food Insecurity:   . Worried About Charity fundraiser in the Last Year: Not on file  . Ran Out of Food in the Last Year: Not on file  Transportation Needs:   . Lack of Transportation (Medical): Not on file  . Lack of Transportation (Non-Medical): Not on file  Physical Activity:   . Days of Exercise per Week: Not on file  . Minutes of Exercise per Session: Not on file  Stress:   . Feeling of Stress : Not on file  Social Connections:   . Frequency of Communication with Friends and Family: Not on file  . Frequency of  Social Gatherings with Friends and Family: Not on file  . Attends Religious Services: Not on file  . Active Member of Clubs or Organizations: Not on file  . Attends Archivist Meetings: Not on file  . Marital Status: Not on file    Allergies:  Allergies  Allergen Reactions  . Levetiracetam Other (See Comments)    Racing thoughts per [facility] neurology note 03-11-11 Other reaction(s): Other (See Comments) Racing thoughts  . Aspirin     Nose bleed, thin blood    Metabolic Disorder Labs: No results found for: HGBA1C, MPG Lab Results  Component Value Date   PROLACTIN 18.9 (H) 06/25/2015   Lab Results  Component Value Date   CHOL 149 02/16/2019   TRIG 202 (H) 02/16/2019   HDL 34 (L) 02/16/2019   CHOLHDL 4.4 02/16/2019   LDLCALC 81 02/16/2019   LDLCALC 32 08/10/2016   Lab Results  Component Value Date   TSH 2.430 06/25/2015   TSH 2.43 06/25/2015    Therapeutic Level Labs: No results found for: LITHIUM No results found for: VALPROATE No components found for:  CBMZ  Current Medications: Current Outpatient Medications  Medication Sig Dispense Refill  . Alfalfa 650 MG TABS Take by mouth.    . Alpha-Lipoic Acid 300 MG CAPS Take 300 mg by mouth every evening. Reported on 09/24/2015    . amoxicillin (AMOXIL) 500 MG capsule Take 1 capsule (500 mg total) by mouth 3 (three) times daily. 30 capsule 0  . ARIPiprazole (ABILIFY) 5 MG tablet Take 1 tablet (5 mg total) by mouth daily. 90 tablet 2  . Armodafinil 250 MG tablet Take 250 mg by mouth daily.    . benzonatate (TESSALON) 100 MG capsule Take 1 capsule (100 mg total) by mouth 2 (two) times daily as needed for cough. 20 capsule 0  . cyclobenzaprine (FLEXERIL) 10 MG tablet Take 10 mg by mouth 3 (three) times daily as needed. Reported on 05/15/2015    . famotidine-calcium carbonate-magnesium hydroxide (PEPCID COMPLETE) 10-800-165 MG chewable tablet Chew 1 tablet by mouth daily as needed.    . fluticasone (FLONASE) 50 MCG/ACT  nasal spray Place 2 sprays into both nostrils daily. 16 g 0  . lamoTRIgine (LAMICTAL) 150 MG tablet Take 1 tablet (150 mg total) by mouth 2 (two) times daily. 180 tablet 1  . lisinopril (PRINIVIL,ZESTRIL) 5 MG tablet TAKE 1 TABLET BY MOUTH DAILY 90 tablet 3  . Melatonin 10 MG CAPS Take 1 capsule by mouth at bedtime.    . mirtazapine (REMERON) 15 MG tablet Take 1 tablet (15 mg total) by mouth at bedtime. 90 tablet 1  . QUDEXY XR 150 MG CS24 take 1 capsule by mouth daily at bedtime  11  . RABEprazole (ACIPHEX) 20 MG tablet TAKE 1 TABLET  BY MOUTH DAILY 30 tablet 5  . rizatriptan (MAXALT) 5 MG tablet Take 5 mg by mouth as needed for migraine. May repeat in 2 hours if needed    . sildenafil (REVATIO) 20 MG tablet 2-5 tabs 1 hour prior to intercourse 120 tablet 3  . traZODone (DESYREL) 150 MG tablet Take 1 tablet (150 mg total) by mouth at bedtime. 90 tablet 1   No current facility-administered medications for this visit.     Musculoskeletal: Strength & Muscle Tone: unable to assess due to telemed visit Gait & Station: unable to assess due to telemed visit Patient leans: unable to assess due to telemed visit  Psychiatric Specialty Exam: Review of Systems  There were no vitals taken for this visit.There is no height or weight on file to calculate BMI.  General Appearance: Fairly Groomed  Eye Contact:  Good  Speech:  Clear and Coherent and Normal Rate  Volume:  Normal  Mood:  Euthymic  Affect:  Congruent  Thought Process:  Goal Directed, Linear and Descriptions of Associations: Intact  Orientation:  Full (Time, Place, and Person)  Thought Content: Logical   Suicidal Thoughts:  No  Homicidal Thoughts:  No  Memory:  Recent;   Good Remote;   Good  Judgement:  Fair  Insight:  Fair  Psychomotor Activity:  Normal  Concentration:  Concentration: Good and Attention Span: Good  Recall:  Good  Fund of Knowledge: Good  Language: Good  Akathisia:  Negative  Handed:  Right  AIMS (if  indicated): not done  Assets:  Communication Skills Desire for Improvement Financial Resources/Insurance Housing Social Support  ADL's:  Intact  Cognition: WNL  Sleep:  Good   Screenings: AIMS     Office Visit from 07/12/2016 in [place] Total Score  0    PHQ2-9     Office Visit from 02/13/2019 in [facility] from 08/16/2017 in [facility] Visit from 08/10/2016 in [facility]  PHQ-2 Total Score  0  0  0  PHQ-9 Total Score  1  --  2       Assessment and Plan: Pt reported doing well on his regimen, he has discontinued taking Abilify since fall and denied any hallucinations since discontinuation. He denied any concerns or issues at this time.  1. Episodic mood disorder (HCC)  - mirtazapine (REMERON) 15 MG tablet; Take 1 tablet (15 mg total) by mouth at bedtime.  Dispense: 90 tablet; Refill: 1 - lamoTRIgine (LAMICTAL) 150 MG tablet; Take 1 tablet (150 mg total) by mouth 2 (two) times daily.  Dispense: 180 tablet; Refill: 1  2. Insomnia due to mental condition  - traZODone (DESYREL) 150 MG tablet; Take 1 tablet (150 mg total) by mouth at bedtime.  Dispense: 90 tablet; Refill: 1  F/up in 3 months.  [place] Crane, MD 06/12/2019, 9:32 AM

## 2019-06-13 ENCOUNTER — Telehealth: Payer: Self-pay | Admitting: Urology

## 2019-06-13 NOTE — Telephone Encounter (Signed)
RX denied, pt has not been seen in a year. Please schedule appt.

## 2019-06-13 NOTE — Telephone Encounter (Signed)
Pt requests a refill for Sildenafil.

## 2019-06-15 ENCOUNTER — Other Ambulatory Visit: Payer: Self-pay

## 2019-06-15 ENCOUNTER — Ambulatory Visit (INDEPENDENT_AMBULATORY_CARE_PROVIDER_SITE_OTHER): Payer: BC Managed Care – PPO | Admitting: Urology

## 2019-06-15 ENCOUNTER — Encounter: Payer: Self-pay | Admitting: Urology

## 2019-06-15 VITALS — BP 133/89 | HR 89 | Ht 72.0 in | Wt 218.0 lb

## 2019-06-15 DIAGNOSIS — N5201 Erectile dysfunction due to arterial insufficiency: Secondary | ICD-10-CM | POA: Diagnosis not present

## 2019-06-15 DIAGNOSIS — N200 Calculus of kidney: Secondary | ICD-10-CM

## 2019-06-15 MED ORDER — SILDENAFIL CITRATE 20 MG PO TABS: ORAL_TABLET | ORAL | 3 refills | Status: DC

## 2019-06-15 NOTE — Progress Notes (Signed)
06/15/2019 9:16 AM   Dustin Paul 1961/04/03 [id number]  Referring provider: Birdie Sons, MD [address] [place],  [place] [address]  Chief Complaint  Patient presents with  . Medication Refill    Urologic history: 1.  Nephrolithiasis -3 mm left renal calculus  2.  Erectile dysfunction -On sildenafil  HPI: 59 y.o. male presents for annual follow-up.  He was last seen in October 2019.  He denies flank, abdominal or pelvic pain.  No bothersome lower urinary tract symptoms or gross hematuria.  He remains on sildenafil for ED which has been effective.   PMH: Past Medical History:  Diagnosis Date  . Aneurysm of artery ([place]) 2011   LEFT (19 MM) AND RIGHT (11 MM) middle cerebral  . Chronic kidney disease    H/O STONES  . Complication of anesthesia   . Headache    MIGRAINES  . Insomnia   . Lumbar stress fracture   . PONV (postoperative nausea and vomiting)    AFTER KNEE SURGERY IN 1980'S  . Seasonal allergies   . Seizures ([place])    last seizure in 2015  . Visual hallucinations     Surgical History: Past Surgical History:  Procedure Laterality Date  . BRAIN SURGERY  2011   2 ANEURYSMS-TOTAL OF 9 CLIPS IN BRAIN FROM ANEURYSM  . COLONOSCOPY WITH PROPOFOL N/A 01/16/2015   Procedure: COLONOSCOPY WITH PROPOFOL;  Surgeon: Hulen Luster, MD;  Location[facility] ENDOSCOPY;  Service: Gastroenterology;  Laterality: N/A;  . ESOPHAGOGASTRODUODENOSCOPY (EGD) WITH PROPOFOL N/A 12/05/2018   Procedure: ESOPHAGOGASTRODUODENOSCOPY (EGD) WITH PROPOFOL;  Surgeon: Toledo, Benay Pike, MD;  Location: [facility] ENDOSCOPY;  Service: Gastroenterology;  Laterality: N/A;  . KNEE ARTHROSCOPY WITH MEDIAL MENISECTOMY Left 11/20/2015   Procedure: KNEE ARTHROSCOPY WITH partial MEDIAL MENISECTOMY / WITH REMOVAL OF LOOSE BODY;  Surgeon: Hessie Knows, MD;  Location: [facility] ORS;  Service: Orthopedics;  Laterality: Left;  . KNEE SURGERY Left     Home Medications:  Allergies as of 06/15/2019    Reactions   Levetiracetam Other (See Comments)   Racing thoughts per [facility] neurology note 03-11-11 Other reaction(s): Other (See Comments) Racing thoughts   Aspirin    Nose bleed, thin blood      Medication List       Accurate as of June 15, 2019  9:16 AM. If you have any questions, ask your nurse or doctor.        STOP taking these medications   amoxicillin 500 MG capsule Commonly known as: AMOXIL Stopped by: Abbie Sons, MD     TAKE these medications   Alfalfa 650 MG Tabs Take by mouth.   Alpha-Lipoic Acid 300 MG Caps Take 300 mg by mouth every evening. Reported on 09/24/2015   ARIPiprazole 5 MG tablet Commonly known as: ABILIFY Take 1 tablet (5 mg total) by mouth daily.   Armodafinil 250 MG tablet Take 250 mg by mouth daily.   benzonatate 100 MG capsule Commonly known as: TESSALON Take 1 capsule (100 mg total) by mouth 2 (two) times daily as needed for cough.   cyclobenzaprine 10 MG tablet Commonly known as: FLEXERIL Take 10 mg by mouth 3 (three) times daily as needed. Reported on 05/15/2015   famotidine-calcium carbonate-magnesium hydroxide 10-800-165 MG chewable tablet Commonly known as: PEPCID COMPLETE Chew 1 tablet by mouth daily as needed.   fluticasone 50 MCG/ACT nasal spray Commonly known as: FLONASE Place 2 sprays into both nostrils daily.   lamoTRIgine 150 MG tablet Commonly  known as: LAMICTAL Take 1 tablet (150 mg total) by mouth 2 (two) times daily.   lisinopril 5 MG tablet Commonly known as: ZESTRIL TAKE 1 TABLET BY MOUTH DAILY   Melatonin 10 MG Caps Take 1 capsule by mouth at bedtime.   mirtazapine 15 MG tablet Commonly known as: REMERON Take 1 tablet (15 mg total) by mouth at bedtime.   Qudexy XR 150 MG Cs24 sprinkle capsule Generic drug: topiramate ER take 1 capsule by mouth daily at bedtime   RABEprazole 20 MG tablet Commonly known as: ACIPHEX TAKE 1 TABLET BY MOUTH DAILY   rizatriptan 5 MG tablet Commonly known as:  MAXALT Take 5 mg by mouth as needed for migraine. May repeat in 2 hours if needed   sildenafil 20 MG tablet Commonly known as: REVATIO 2-5 tabs 1 hour prior to intercourse   traZODone 150 MG tablet Commonly known as: DESYREL Take 1 tablet (150 mg total) by mouth at bedtime.       Allergies:  Allergies  Allergen Reactions  . Levetiracetam Other (See Comments)    Racing thoughts per [facility] neurology note 03-11-11 Other reaction(s): Other (See Comments) Racing thoughts  . Aspirin     Nose bleed, thin blood    Family History: No family history on file.  Social History:  reports that he has never smoked. He has never used smokeless tobacco. He reports that he does not drink alcohol or use drugs.  ROS: UROLOGY Frequent Urination?: No Hard to postpone urination?: No Burning/pain with urination?: No Get up at night to urinate?: No Leakage of urine?: No Urine stream starts and stops?: No Trouble starting stream?: No Do you have to strain to urinate?: No Blood in urine?: No Urinary tract infection?: No Sexually transmitted disease?: No Injury to kidneys or bladder?: No Painful intercourse?: No Weak stream?: No Erection problems?: Yes Penile pain?: No  Gastrointestinal Nausea?: No Vomiting?: No Indigestion/heartburn?: No Diarrhea?: No Constipation?: No  Constitutional Fever: No Night sweats?: No Weight loss?: No Fatigue?: No  Skin Skin rash/lesions?: No Itching?: No  Eyes Blurred vision?: No Double vision?: No  Ears/Nose/Throat Sore throat?: No Sinus problems?: No  Hematologic/Lymphatic Swollen glands?: No Easy bruising?: No  Cardiovascular Leg swelling?: No Chest pain?: No  Respiratory Cough?: No Shortness of breath?: No  Endocrine Excessive thirst?: No  Musculoskeletal Back pain?: No Joint pain?: No  Neurological Headaches?: No Dizziness?: No  Psychologic Depression?: No Anxiety?: No  Physical Exam: BP 133/89   Pulse 89   Ht  6' (1.829 m)   Wt 218 lb (98.9 kg)   BMI 29.57 kg/m   Constitutional:  Alert and oriented, No acute distress. HEENT: [place] AT, moist mucus membranes.  Trachea midline, no masses. Cardiovascular: No clubbing, cyanosis, or edema. Respiratory: Normal respiratory effort, no increased work of breathing. Skin: No rashes, bruises or suspicious lesions. Neurologic: Grossly intact, no focal deficits, moving all 4 extremities. Psychiatric: Normal mood and affect.   Assessment & Plan:    - Nephrolithiasis Small nonobstructing left renal calculus.  KUB was ordered and he will be notified with results.  Follow-up 1 year  - Erectile dysfunction Stable on sildenafil.  Refill sent to pharmacy.   Abbie Sons, [place] [address], [place], [place] [address] [phone]

## 2019-06-19 ENCOUNTER — Other Ambulatory Visit: Payer: Self-pay | Admitting: Psychiatry

## 2019-07-04 ENCOUNTER — Encounter: Payer: Self-pay | Admitting: Family Medicine

## 2019-08-02 ENCOUNTER — Telehealth: Payer: Self-pay

## 2019-08-02 NOTE — Telephone Encounter (Signed)
Patient called requesting a dosage increase from 150mg  to 200mg  on his Trazodone. He stated that he's not sleeping well. He uses [facility] on [address] in [place]. Patient has a scheduled appointment on 09/04/19. Thank you.

## 2019-08-07 MED ORDER — TRAZODONE HCL 100 MG PO TABS: ORAL_TABLET | ORAL | 0 refills | Status: DC

## 2019-08-07 NOTE — Telephone Encounter (Signed)
New prescription for Trazodone 200 mg HS sent to his pharmacy.

## 2019-09-03 ENCOUNTER — Other Ambulatory Visit: Payer: Self-pay | Admitting: Urology

## 2019-09-04 ENCOUNTER — Other Ambulatory Visit: Payer: Self-pay

## 2019-09-04 ENCOUNTER — Ambulatory Visit (INDEPENDENT_AMBULATORY_CARE_PROVIDER_SITE_OTHER): Payer: BC Managed Care – PPO | Admitting: Psychiatry

## 2019-09-04 ENCOUNTER — Encounter: Payer: Self-pay | Admitting: Psychiatry

## 2019-09-04 DIAGNOSIS — F5105 Insomnia due to other mental disorder: Secondary | ICD-10-CM | POA: Diagnosis not present

## 2019-09-04 DIAGNOSIS — F39 Unspecified mood [affective] disorder: Secondary | ICD-10-CM | POA: Diagnosis not present

## 2019-09-04 MED ORDER — LAMOTRIGINE 150 MG PO TABS: 150.0000 mg | ORAL_TABLET | Freq: Two times a day (BID) | ORAL | 1 refills | Status: DC

## 2019-09-04 MED ORDER — MIRTAZAPINE 15 MG PO TABS: 15.0000 mg | ORAL_TABLET | Freq: Every day | ORAL | 1 refills | Status: DC

## 2019-09-04 MED ORDER — TRAZODONE HCL 100 MG PO TABS: ORAL_TABLET | ORAL | 0 refills | Status: DC

## 2019-09-04 NOTE — Progress Notes (Signed)
Dustin Paul  I connected with  Dustin Paul on 09/04/19 by a video enabled telemedicine application and verified that I am speaking with the correct person using two identifiers.   I discussed the limitations of evaluation and management by telemedicine. The patient expressed understanding and agreed to proceed.    09/04/2019 8:39 AM Dustin Paul  MRN:  [id number]  Chief Complaint: " I am doing well."  HPI: Pt reported that he has been doing well on his current medications.  He had called a few weeks ago to request increasing the dose of trazodone as the dose was not helping him sleep throughout the night.  His dose of trazodone was increased to 200 mg in early March.  Patient reported that he is sleeping much better now.  He informed that his mood has been stable.  He informed that things have been little hectic at home as there are having some remodeling done in their house.  Other than that minor stressor things are going well.   Visit Diagnosis:    ICD-10-CM   1. Episodic mood disorder ([place])  F39   2. Insomnia due to mental condition  F51.05     Past Psychiatric History: Mood d/o, insomnia  Past Medical History:  Past Medical History:  Diagnosis Date  . Aneurysm of artery ([place]) 2011   LEFT (19 MM) AND RIGHT (11 MM) middle cerebral  . Chronic kidney disease    H/O STONES  . Complication of anesthesia   . Headache    MIGRAINES  . Insomnia   . Lumbar stress fracture   . PONV (postoperative nausea and vomiting)    AFTER KNEE SURGERY IN 1980'S  . Seasonal allergies   . Seizures ([place])    last seizure in 2015  . Visual hallucinations     Past Surgical History:  Procedure Laterality Date  . BRAIN SURGERY  2011   2 ANEURYSMS-TOTAL OF 9 CLIPS IN BRAIN FROM ANEURYSM  . COLONOSCOPY WITH PROPOFOL N/A 01/16/2015   Procedure: COLONOSCOPY WITH PROPOFOL;  Surgeon: Hulen Luster, MD;  Location: [facility] ENDOSCOPY;  Service: Gastroenterology;  Laterality: N/A;  .  ESOPHAGOGASTRODUODENOSCOPY (EGD) WITH PROPOFOL N/A 12/05/2018   Procedure: ESOPHAGOGASTRODUODENOSCOPY (EGD) WITH PROPOFOL;  Surgeon: Toledo, Benay Pike, MD;  Location: [facility] ENDOSCOPY;  Service: Gastroenterology;  Laterality: N/A;  . KNEE ARTHROSCOPY WITH MEDIAL MENISECTOMY Left 11/20/2015   Procedure: KNEE ARTHROSCOPY WITH partial MEDIAL MENISECTOMY / WITH REMOVAL OF LOOSE BODY;  Surgeon: Hessie Knows, MD;  Location: [facility] ORS;  Service: Orthopedics;  Laterality: Left;  . KNEE SURGERY Left     Family Psychiatric History: denied  Family History: History reviewed. No pertinent family history.  Social History:  Social History   Socioeconomic History  . Marital status: Married    Spouse name: Not on file  . Number of children: 2  . Years of education: Not on file  . Highest education level: Bachelor's degree (e.g., BA, AB, BS)  Occupational History  . Occupation: engineer/retired  Tobacco Use  . Smoking status: Never Smoker  . Smokeless tobacco: Never Used  Substance and Sexual Activity  . Alcohol use: No    Alcohol/week: 0.0 standard drinks    Comment: 1-2 drinks a month  . Drug use: No  . Sexual activity: Not Currently  Other Topics Concern  . Not on file  Social History Narrative  . Not on file   Social Determinants of Health   Financial Resource Strain:   .  Difficulty of Paying Living Expenses:   Food Insecurity:   . Worried About Charity fundraiser in the Last Year:   . Arboriculturist in the Last Year:   Transportation Needs:   . Film/video editor (Medical):   Marland Kitchen Lack of Transportation (Non-Medical):   Physical Activity:   . Days of Exercise per Week:   . Minutes of Exercise per Session:   Stress:   . Feeling of Stress :   Social Connections:   . Frequency of Communication with Friends and Family:   . Frequency of Social Gatherings with Friends and Family:   . Attends Religious Services:   . Active Member of Clubs or Organizations:   . Attends Theatre manager Meetings:   Marland Kitchen Marital Status:     Allergies:  Allergies  Allergen Reactions  . Levetiracetam Other (See Comments)    Racing thoughts per [facility] neurology Paul 03-11-11 Other reaction(s): Other (See Comments) Racing thoughts  . Aspirin     Nose bleed, thin blood    Metabolic Disorder Labs: No results found for: HGBA1C, MPG Lab Results  Component Value Date   PROLACTIN 18.9 (H) 06/25/2015   Lab Results  Component Value Date   CHOL 149 02/16/2019   TRIG 202 (H) 02/16/2019   HDL 34 (L) 02/16/2019   CHOLHDL 4.4 02/16/2019   LDLCALC 81 02/16/2019   LDLCALC 32 08/10/2016   Lab Results  Component Value Date   TSH 2.430 06/25/2015   TSH 2.43 06/25/2015    Therapeutic Level Labs: No results found for: LITHIUM No results found for: VALPROATE No components found for:  CBMZ  Current Medications: Current Outpatient Medications  Medication Sig Dispense Refill  . Alfalfa 650 MG TABS Take by mouth.    . Alpha-Lipoic Acid 300 MG CAPS Take 300 mg by mouth every evening. Reported on 09/24/2015    . ARIPiprazole (ABILIFY) 5 MG tablet Take 1 tablet (5 mg total) by mouth daily. 90 tablet 2  . Armodafinil 250 MG tablet Take 250 mg by mouth daily.    . benzonatate (TESSALON) 100 MG capsule Take 1 capsule (100 mg total) by mouth 2 (two) times daily as needed for cough. 20 capsule 0  . cyclobenzaprine (FLEXERIL) 10 MG tablet Take 10 mg by mouth 3 (three) times daily as needed. Reported on 05/15/2015    . famotidine-calcium carbonate-magnesium hydroxide (PEPCID COMPLETE) 10-800-165 MG chewable tablet Chew 1 tablet by mouth daily as needed.    . fluticasone (FLONASE) 50 MCG/ACT nasal spray Place 2 sprays into both nostrils daily. 16 g 0  . lamoTRIgine (LAMICTAL) 150 MG tablet Take 1 tablet (150 mg total) by mouth 2 (two) times daily. 180 tablet 1  . lisinopril (PRINIVIL,ZESTRIL) 5 MG tablet TAKE 1 TABLET BY MOUTH DAILY 90 tablet 3  . Melatonin 10 MG CAPS Take 1 capsule by mouth at  bedtime.    . mirtazapine (REMERON) 15 MG tablet Take 1 tablet (15 mg total) by mouth at bedtime. 90 tablet 1  . QUDEXY XR 150 MG CS24 take 1 capsule by mouth daily at bedtime  11  . RABEprazole (ACIPHEX) 20 MG tablet TAKE 1 TABLET BY MOUTH DAILY 30 tablet 5  . rizatriptan (MAXALT) 5 MG tablet Take 5 mg by mouth as needed for migraine. May repeat in 2 hours if needed    . sildenafil (REVATIO) 20 MG tablet TAKE 2-5 TABLETS 1 HOUR PRIOR TO INTERCOURSE 120 tablet 0  . traZODone (DESYREL) 100  MG tablet Take 2 tablets at bedtime as needed for sleep 180 tablet 0   No current facility-administered medications for this visit.     Musculoskeletal: Strength & Muscle Tone: unable to assess due to telemed visit Gait & Station: unable to assess due to telemed visit Patient leans: unable to assess due to telemed visit  Psychiatric Specialty Exam: Review of Systems  There were no vitals taken for this visit.There is no height or weight on file to calculate BMI.  General Appearance: Fairly Groomed  Eye Contact:  Good  Speech:  Clear and Coherent and Normal Rate  Volume:  Normal  Mood:  Euthymic  Affect:  Congruent  Thought Process:  Goal Directed, Linear and Descriptions of Associations: Intact  Orientation:  Full (Time, Place, and Person)  Thought Content: Logical   Suicidal Thoughts:  No  Homicidal Thoughts:  No  Memory:  Recent;   Good Remote;   Good  Judgement:  Fair  Insight:  Fair  Psychomotor Activity:  Normal  Concentration:  Concentration: Good and Attention Span: Good  Recall:  Good  Fund of Knowledge: Good  Language: Good  Akathisia:  Negative  Handed:  Right  AIMS (if indicated): not done  Assets:  Communication Skills Desire for Improvement Financial Resources/Insurance Housing Social Support  ADL's:  Intact  Cognition: WNL  Sleep:  Good   Screenings: AIMS     Office Visit from 07/12/2016 in [facility] Total Score  0    PHQ2-9      Office Visit from 02/13/2019 in [facility] from 08/16/2017 in [facility] Visit from 08/10/2016 in [facility]  PHQ-2 Total Score  0  0  0  PHQ-9 Total Score  1  --  2       Assessment and Plan: Pt reported doing well on his regimen.  1. Episodic mood disorder (HCC)  - mirtazapine (REMERON) 15 MG tablet; Take 1 tablet (15 mg total) by mouth at bedtime.  Dispense: 90 tablet; Refill: 1 - lamoTRIgine (LAMICTAL) 150 MG tablet; Take 1 tablet (150 mg total) by mouth 2 (two) times daily.  Dispense: 180 tablet; Refill: 1  2. Insomnia due to mental condition  - traZODone (DESYREL) 100 MG tablet; Take 2 tablets at bedtime as needed for sleep  Dispense: 180 tablet; Refill: 0    F/up in 3 months.  [place] Crane, MD 09/04/2019, 8:39 AM

## 2019-09-05 DIAGNOSIS — G43719 Chronic migraine without aura, intractable, without status migrainosus: Secondary | ICD-10-CM | POA: Diagnosis not present

## 2019-09-05 DIAGNOSIS — G2581 Restless legs syndrome: Secondary | ICD-10-CM | POA: Diagnosis not present

## 2019-09-05 DIAGNOSIS — R52 Pain, unspecified: Secondary | ICD-10-CM | POA: Diagnosis not present

## 2019-09-10 ENCOUNTER — Telehealth: Payer: Self-pay | Admitting: *Deleted

## 2019-09-10 ENCOUNTER — Encounter: Payer: BC Managed Care – PPO | Admitting: Family Medicine

## 2019-09-10 ENCOUNTER — Encounter: Payer: Self-pay | Admitting: Family Medicine

## 2019-09-10 ENCOUNTER — Other Ambulatory Visit: Payer: Self-pay | Admitting: Family Medicine

## 2019-09-10 MED ORDER — LISINOPRIL 5 MG PO TABS: 5.0000 mg | ORAL_TABLET | Freq: Every day | ORAL | 0 refills | Status: DC

## 2019-09-10 NOTE — Progress Notes (Deleted)
MyChart Video Visit   {  This note template is in development as part of the [place].   This template contains optional SmartLists. If no selections are made from an optional SmartList, it will be automatically removed when the note is signed.  Left clicking SmartLists that are in blue, underlined text will show additional information regarding the patient's history unless you are already editing the note in a pop-up window.  This text will be automatically removed from this note when it is signed.:1}   Virtual Visit via Video Note   This visit type was conducted due to national recommendations for restrictions regarding the COVID-19 Pandemic (e.g. social distancing) in an effort to limit this patient's exposure and mitigate transmission in our community. This patient is at least at moderate risk for complications without adequate follow up. This format is felt to be most appropriate for this patient at this time. Physical exam was limited by quality of the video and audio technology used for the visit.   Patient location: *** Provider location: ***    Patient: Dustin Paul   DOB: 08/07/1960   59 y.o. Male  MRN: [id number] Visit Date: 09/10/2019  Today's Provider: Lelon Huh, MD  Subjective:   No chief complaint on file.  HPI  Hypertension, follow-up:  BP Readings from Last 3 Encounters:  06/15/19 133/89  02/13/19 122/84  12/05/18 (!) 129/93    He was last seen for hypertension 7 months ago.  BP at that visit was 122/84. Management since that visit includes no changes. He reports {excellent/good/fair/poor:19665} compliance with treatment. He {ACTION; IS/IS VG:[id number] having side effects. *** He {is/is not:9024} exercising. He {is/is not:9024} adherent to low salt diet.   Outside blood pressures are ***. He is experiencing {Symptoms; cardiac:12860}.  Patient denies {Symptoms; cardiac:12860}.   Cardiovascular risk factors  include {cv risk factors:510}.  Use of agents associated with hypertension: {bp agents assoc with hypertension:511::"none"}.     Weight trend: {trend:16658} Wt Readings from Last 3 Encounters:  06/15/19 218 lb (98.9 kg)  02/13/19 221 lb (100.2 kg)  12/05/18 214 lb (97.1 kg)    Current diet: {diet habits:16563}  ------------------------------------------------------------------------  Follow up for Seizure Disorder:  The patient was last seen for this 7 months ago. Changes made at last visit include none.  He reports {excellent/good/fair/poor:19665} compliance with treatment. He feels that condition is {improved/worse/unchanged:3041574}. He {ACTION; IS/IS VG:[id number] having side effects. ***  ------------------------------------------------------------------------------------    {Show patient history (optional):23778::" "}  Medications: Outpatient Medications Prior to Visit  Medication Sig  . Alfalfa 650 MG TABS Take by mouth.  . Alpha-Lipoic Acid 300 MG CAPS Take 300 mg by mouth every evening. Reported on 09/24/2015  . Armodafinil 250 MG tablet Take 250 mg by mouth daily.  . benzonatate (TESSALON) 100 MG capsule Take 1 capsule (100 mg total) by mouth 2 (two) times daily as needed for cough.  . cyclobenzaprine (FLEXERIL) 10 MG tablet Take 10 mg by mouth 3 (three) times daily as needed. Reported on 05/15/2015  . famotidine-calcium carbonate-magnesium hydroxide (PEPCID COMPLETE) 10-800-165 MG chewable tablet Chew 1 tablet by mouth daily as needed.  . fluticasone (FLONASE) 50 MCG/ACT nasal spray Place 2 sprays into both nostrils daily.  Marland Kitchen lamoTRIgine (LAMICTAL) 150 MG tablet Take 1 tablet (150 mg total) by mouth 2 (two) times daily.  Marland Kitchen lisinopril (ZESTRIL) 5 MG tablet Take 1 tablet (5 mg total) by mouth daily.  . Melatonin 10 MG CAPS Take 1  capsule by mouth at bedtime.  . mirtazapine (REMERON) 15 MG tablet Take 1 tablet (15 mg total) by mouth at bedtime.  . QUDEXY XR 150 MG  CS24 take 1 capsule by mouth daily at bedtime  . RABEprazole (ACIPHEX) 20 MG tablet TAKE 1 TABLET BY MOUTH DAILY  . rizatriptan (MAXALT) 5 MG tablet Take 5 mg by mouth as needed for migraine. May repeat in 2 hours if needed  . sildenafil (REVATIO) 20 MG tablet TAKE 2-5 TABLETS 1 HOUR PRIOR TO INTERCOURSE  . traZODone (DESYREL) 100 MG tablet Take 2 tablets at bedtime as needed for sleep   No facility-administered medications prior to visit.    {Show previous labs (optional):23779::" "}  Review of Systems  Constitutional: Negative for appetite change, chills and fever.  Respiratory: Negative for chest tightness, shortness of breath and wheezing.   Cardiovascular: Negative for chest pain and palpitations.  Gastrointestinal: Negative for abdominal pain, nausea and vomiting.    {Show previous labs (optional):23779::" "}    Objective:    There were no vitals taken for this visit. {Show previous vital signs (optional):23777::" "}  Physical Exam       Assessment & Plan:    ***    I discussed the assessment and treatment plan with the patient. The patient was provided an opportunity to ask questions and all were answered. The patient agreed with the plan and demonstrated an understanding of the instructions.   The patient was advised to call back or seek an in-person evaluation if the symptoms worsen or if the condition fails to improve as anticipated.  I provided *** minutes of non-face-to-face time during this encounter.   Lelon Huh, MD  [facility] [phone] (phone) [phone] (fax)  [place]

## 2019-09-10 NOTE — Telephone Encounter (Signed)
Medication Refill - Medication: Lisinopril 5mg   Has the patient contacted their pharmacy? No. (Agent: If no, request that the patient contact the pharmacy for the refill.) (Agent: If yes, when and what did the pharmacy advise?)  Preferred Pharmacy (with phone number or street name): [facility]   Agent: Please be advised that RX refills may take up to 3 business days. We ask that you follow-up with your pharmacy.

## 2019-09-10 NOTE — Telephone Encounter (Signed)
Requested Prescriptions  Pending Prescriptions Disp Refills  . lisinopril (ZESTRIL) 5 MG tablet 90 tablet 3    Sig: Take 1 tablet (5 mg total) by mouth daily.     Cardiovascular:  ACE Inhibitors Failed - 09/10/2019  9:14 AM      Failed - Cr in normal range and within 180 days    Creatinine, Ser  Date Value Ref Range Status  02/16/2019 1.44 (H) 0.76 - 1.27 mg/dL Final         Failed - K in normal range and within 180 days    Potassium  Date Value Ref Range Status  02/16/2019 4.8 3.5 - 5.2 mmol/L Final         Failed - Valid encounter within last 6 months    Recent Outpatient Visits          3 months ago Upper respiratory infection, acute   [facility], Kelby Aline, FNP   6 months ago Annual physical exam   [facility] Birdie Sons, MD   9 months ago Cough   [facility] Birdie Sons, MD   3 years ago Medicare annual wellness visit, subsequent   [facility] Birdie Sons, MD   3 years ago Subungual hematoma of digit of hand, initial encounter   [place], Kirstie Peri, MD      Future Appointments            Today Caryn Section, Kirstie Peri, MD [facility]   In 9 months Stoioff, Ronda Fairly, MD [place] - Patient is not pregnant      Passed - Last BP in normal range    BP Readings from Last 1 Encounters:  06/15/19 133/89

## 2019-09-10 NOTE — Telephone Encounter (Signed)
Refill request for Lisinopril; no valid encounter within last 6 months; no upcoming visits noted; pt notified; pt offered and accepted virtual appt with Dr Lelon Huh, [facility], 09/10/19 at 1020; 30 day courtesy refill granted; will route to office for notification.

## 2019-09-14 NOTE — Progress Notes (Signed)
  This encounter was created in error - please disregard. Appointment was cancelled

## 2019-09-27 ENCOUNTER — Other Ambulatory Visit: Payer: Self-pay | Admitting: Urology

## 2019-09-27 ENCOUNTER — Other Ambulatory Visit: Payer: Self-pay | Admitting: Family Medicine

## 2019-09-27 DIAGNOSIS — K219 Gastro-esophageal reflux disease without esophagitis: Secondary | ICD-10-CM

## 2019-10-04 ENCOUNTER — Telehealth: Payer: Self-pay | Admitting: Family Medicine

## 2019-10-04 NOTE — Telephone Encounter (Signed)
Copied from [place] [phone]. Topic: General - Other >> Oct 04, 2019  9:23 AM Keene Breath wrote: Reason for CRM: Patient would like the nurse to call regarding a physical appt.  Patient's last physical was 01/2019.  He would like to know the cost if he pays for it himself.  Please advise and call patient to discuss at [phone]

## 2019-10-05 NOTE — Progress Notes (Signed)
Established patient visit   Patient: Dustin Paul   DOB: 1960-07-05   59 y.o. Male  MRN: [id number] Visit Date: 10/08/2019  Today's healthcare provider: Lelon Huh, MD   Chief Complaint  Patient presents with  . Hypertension   Subjective    HPI Hypertension, follow-up  BP Readings from Last 3 Encounters:  10/08/19 122/80  06/15/19 133/89  02/13/19 122/84   Wt Readings from Last 3 Encounters:  10/08/19 223 lb (101.2 kg)  06/15/19 218 lb (98.9 kg)  02/13/19 221 lb (100.2 kg)     He was last seen for hypertension 7 months ago.  BP at that visit was 122/84. Management since that visit includes continuing same medication.  He reports good compliance with treatment. He is not having side effects.  He is following a Regular diet. He is exercising. He does not smoke.  Use of agents associated with hypertension: none.   Outside blood pressures are not checked. Symptoms: No chest pain No chest pressure No palpitations No dyspnea No orthopnea No paroxysmal nocturnal dyspnea No lower extremity edema No syncope   Pertinent labs: Lab Results  Component Value Date   CHOL 149 02/16/2019   HDL 34 (L) 02/16/2019   LDLCALC 81 02/16/2019   TRIG 202 (H) 02/16/2019   CHOLHDL 4.4 02/16/2019   Lab Results  Component Value Date   NA 141 02/16/2019   K 4.8 02/16/2019   CO2 19 (L) 02/16/2019   GLUCOSE 102 (H) 02/16/2019   BUN 17 02/16/2019   CREATININE 1.44 (H) 02/16/2019   CALCIUM 9.9 02/16/2019   GFRNONAA 53 (L) 02/16/2019   GFRAA 61 02/16/2019     The 10-year ASCVD risk score Mikey Bussing DC Jr., et al., 2013) is: 7.9%   ---------------------------------------------------------------------------------------------------  Mass: Patient complains of a hard mass on the right side of his chest. He states the mass is painful to touch. He says he has had these knots pop up and resolve for almost a year. It comes and goes, usually appears for a day or two every  week, then goes away. Has had no known injury. Indicates area over 4th anterior rib as location, although it had mostly gone away today.      Medications: Outpatient Medications Prior to Visit  Medication Sig  . Armodafinil 250 MG tablet Take 250 mg by mouth daily.  . cyclobenzaprine (FLEXERIL) 10 MG tablet Take 10 mg by mouth 3 (three) times daily as needed. Reported on 05/15/2015  . famotidine-calcium carbonate-magnesium hydroxide (PEPCID COMPLETE) 10-800-165 MG chewable tablet Chew 1 tablet by mouth daily as needed.  . lamoTRIgine (LAMICTAL) 150 MG tablet Take 1 tablet (150 mg total) by mouth 2 (two) times daily.  Marland Kitchen lisinopril (ZESTRIL) 5 MG tablet Take 1 tablet (5 mg total) by mouth daily.  . Melatonin 10 MG CAPS Take 1 capsule by mouth at bedtime.  . mirtazapine (REMERON) 15 MG tablet Take 1 tablet (15 mg total) by mouth at bedtime.  . QUDEXY XR 150 MG CS24 take 1 capsule by mouth daily at bedtime  . RABEprazole (ACIPHEX) 20 MG tablet TAKE ONE TABLET EVERY DAY  . rizatriptan (MAXALT) 5 MG tablet Take 5 mg by mouth as needed for migraine. May repeat in 2 hours if needed  . sildenafil (REVATIO) 20 MG tablet TAKE 2-5 TABLETS 1 HOUR PRIOR TO INTERCOURSE  . traZODone (DESYREL) 100 MG tablet Take 2 tablets at bedtime as needed for sleep  . [DISCONTINUED] Alfalfa 650 MG TABS Take by  mouth.  . [DISCONTINUED] Alpha-Lipoic Acid 300 MG CAPS Take 300 mg by mouth every evening. Reported on 09/24/2015  . [DISCONTINUED] benzonatate (TESSALON) 100 MG capsule Take 1 capsule (100 mg total) by mouth 2 (two) times daily as needed for cough. (Patient not taking: Reported on 09/10/2019)  . [DISCONTINUED] fluticasone (FLONASE) 50 MCG/ACT nasal spray Place 2 sprays into both nostrils daily. (Patient not taking: Reported on 09/10/2019)   No facility-administered medications prior to visit.    Review of Systems  Constitutional: Negative for appetite change, chills and fever.  Respiratory: Negative for chest  tightness, shortness of breath and wheezing.   Cardiovascular: Negative for chest pain and palpitations.  Gastrointestinal: Negative for abdominal pain, nausea and vomiting.  Musculoskeletal:       Mass on right side of chest      Objective    BP 122/80 (BP Location: Left Arm, Patient Position: Sitting, Cuff Size: Large)   Pulse 72   Temp (!) 97.3 F (36.3 C) (Temporal)   Resp 16   Wt 223 lb (101.2 kg)   SpO2 95% Comment: room air  BMI 30.24 kg/m    Physical Exam   General: Appearance:    Obese male in no acute distress  Eyes:    PERRL, conjunctiva/corneas clear, EOM's intact       Lungs:     Clear to auscultation bilaterally, respirations unlabored  Heart:    Normal heart rate. Normal rhythm. No murmurs, rubs, or gallops.   MS:   Slight tenderness and very vague mass over anterior rib as per HPI, but is much smaller that it was a few days ago by patient report.   Neurologic:   Awake, alert, oriented x 3. No apparent focal neurological           defect.         No results found for any visits on 10/08/19.  Assessment & Plan     1. Essential (primary) hypertension Well controlled.  Continue current medications.  Follow up yearly exam and labs in about 4 months.   2. Chest wall mass Comes and goes for about a day or two each week, now mostly gone. Mostly likely muscular or subcutaneous cyst. Advised that if it becomes more persistent would refer him to dermatology.   Return in about 4 months (around 02/08/2020) for Yearly Physical.      The entirety of the information documented in the History of Present Illness, Review of Systems and Physical Exam were personally obtained by me. Portions of this information were initially documented by the CMA and reviewed by me for thoroughness and accuracy.      Lelon Huh, MD  [facility] [phone] (phone) [phone] (fax)  [place]

## 2019-10-08 ENCOUNTER — Encounter: Payer: Self-pay | Admitting: Family Medicine

## 2019-10-08 ENCOUNTER — Ambulatory Visit (INDEPENDENT_AMBULATORY_CARE_PROVIDER_SITE_OTHER): Payer: BC Managed Care – PPO | Admitting: Family Medicine

## 2019-10-08 ENCOUNTER — Other Ambulatory Visit: Payer: Self-pay

## 2019-10-08 VITALS — BP 122/80 | HR 72 | Temp 97.3°F | Resp 16 | Wt 223.0 lb

## 2019-10-08 DIAGNOSIS — R222 Localized swelling, mass and lump, trunk: Secondary | ICD-10-CM | POA: Diagnosis not present

## 2019-10-08 DIAGNOSIS — I10 Essential (primary) hypertension: Secondary | ICD-10-CM

## 2019-10-08 NOTE — Patient Instructions (Signed)
.   Please review the attached list of medications and notify my office if there are any errors.   . Please bring all of your medications to every appointment so we can make sure that our medication list is the same as yours.   

## 2019-10-09 ENCOUNTER — Other Ambulatory Visit: Payer: Self-pay | Admitting: Urology

## 2019-10-09 ENCOUNTER — Other Ambulatory Visit: Payer: Self-pay | Admitting: Family Medicine

## 2019-10-09 NOTE — Telephone Encounter (Signed)
Requested Prescriptions  Pending Prescriptions Disp Refills  . lisinopril (ZESTRIL) 5 MG tablet [Pharmacy Med Name: LISINOPRIL 5 MG TAB] 90 tablet 1    Sig: TAKE ONE TABLET BY MOUTH EVERY DAY     Cardiovascular:  ACE Inhibitors Failed - 10/09/2019  6:32 PM      Failed - Cr in normal range and within 180 days    Creatinine, Ser  Date Value Ref Range Status  02/16/2019 1.44 (H) 0.76 - 1.27 mg/dL Final         Failed - K in normal range and within 180 days    Potassium  Date Value Ref Range Status  02/16/2019 4.8 3.5 - 5.2 mmol/L Final         Passed - Patient is not pregnant      Passed - Last BP in normal range    BP Readings from Last 1 Encounters:  10/08/19 122/80         Passed - Valid encounter within last 6 months    Recent Outpatient Visits          Yesterday Essential (primary) hypertension   [facility] Birdie Sons, MD   4 months ago Upper respiratory infection, acute   [facility], Kelby Aline, FNP   7 months ago Annual physical exam   [facility] Birdie Sons, MD   10 months ago Cough   [facility] Birdie Sons, MD   3 years ago Medicare annual wellness visit, subsequent   [place], MD      Future Appointments            In 8 months Stoioff, Ronda Fairly, MD [place]           Seen by provider on 10/08/2019 with notation to continue antihypertensive medications and plan to recheck labs in 4 months.

## 2019-10-22 ENCOUNTER — Other Ambulatory Visit: Payer: Self-pay | Admitting: Urology

## 2019-11-02 ENCOUNTER — Other Ambulatory Visit: Payer: Self-pay | Admitting: Urology

## 2019-11-06 ENCOUNTER — Telehealth: Payer: Self-pay | Admitting: Urology

## 2019-11-06 ENCOUNTER — Other Ambulatory Visit: Payer: Self-pay | Admitting: *Deleted

## 2019-11-06 MED ORDER — SILDENAFIL CITRATE 20 MG PO TABS: ORAL_TABLET | ORAL | 0 refills | Status: DC

## 2019-11-06 NOTE — Telephone Encounter (Signed)
Rx sent 

## 2019-11-06 NOTE — Telephone Encounter (Signed)
Pt called office to let us know they denied his request for Sildenafil.  He wants to know what's going on.  [facility] told pt that is was denied from dr office when they sent over for a refill.

## 2019-11-23 ENCOUNTER — Telehealth (HOSPITAL_COMMUNITY): Payer: BC Managed Care – PPO | Admitting: Psychiatry

## 2019-11-23 ENCOUNTER — Telehealth: Payer: BC Managed Care – PPO | Admitting: Psychiatry

## 2019-11-26 ENCOUNTER — Other Ambulatory Visit: Payer: Self-pay | Admitting: Family Medicine

## 2019-11-26 ENCOUNTER — Other Ambulatory Visit: Payer: Self-pay | Admitting: Urology

## 2019-11-26 DIAGNOSIS — K219 Gastro-esophageal reflux disease without esophagitis: Secondary | ICD-10-CM

## 2019-11-27 ENCOUNTER — Telehealth: Payer: Self-pay | Admitting: Radiology

## 2019-11-27 ENCOUNTER — Other Ambulatory Visit: Payer: Self-pay | Admitting: Urology

## 2019-11-27 NOTE — Telephone Encounter (Signed)
Patient would like a refill of sildenafil sent to [facility].

## 2019-12-14 ENCOUNTER — Other Ambulatory Visit: Payer: Self-pay | Admitting: Urology

## 2020-01-02 ENCOUNTER — Other Ambulatory Visit: Payer: Self-pay | Admitting: Urology

## 2020-01-02 ENCOUNTER — Telehealth: Payer: Self-pay | Admitting: Urology

## 2020-01-02 NOTE — Telephone Encounter (Signed)
Would you call Mr. Sellick and ask him if he has received his prescription for sildenafil?  I just sent another request for refill, but it states he received 120 tablets 2 weeks ago by Dr. Bernardo Heater.

## 2020-01-02 NOTE — Telephone Encounter (Signed)
Patient states he has not picked up the 120 count polls yet. Thanks

## 2020-01-03 DIAGNOSIS — M9901 Segmental and somatic dysfunction of cervical region: Secondary | ICD-10-CM | POA: Diagnosis not present

## 2020-01-03 DIAGNOSIS — M4602 Spinal enthesopathy, cervical region: Secondary | ICD-10-CM | POA: Diagnosis not present

## 2020-01-03 DIAGNOSIS — M62830 Muscle spasm of back: Secondary | ICD-10-CM | POA: Diagnosis not present

## 2020-01-03 DIAGNOSIS — M542 Cervicalgia: Secondary | ICD-10-CM | POA: Diagnosis not present

## 2020-01-07 ENCOUNTER — Telehealth (HOSPITAL_COMMUNITY): Payer: BC Managed Care – PPO | Admitting: Psychiatry

## 2020-01-10 DIAGNOSIS — M62830 Muscle spasm of back: Secondary | ICD-10-CM | POA: Diagnosis not present

## 2020-01-10 DIAGNOSIS — M542 Cervicalgia: Secondary | ICD-10-CM | POA: Diagnosis not present

## 2020-01-10 DIAGNOSIS — M9901 Segmental and somatic dysfunction of cervical region: Secondary | ICD-10-CM | POA: Diagnosis not present

## 2020-01-10 DIAGNOSIS — M4602 Spinal enthesopathy, cervical region: Secondary | ICD-10-CM | POA: Diagnosis not present

## 2020-01-22 ENCOUNTER — Other Ambulatory Visit: Payer: Self-pay | Admitting: Urology

## 2020-01-22 DIAGNOSIS — M62830 Muscle spasm of back: Secondary | ICD-10-CM | POA: Diagnosis not present

## 2020-01-22 DIAGNOSIS — M9901 Segmental and somatic dysfunction of cervical region: Secondary | ICD-10-CM | POA: Diagnosis not present

## 2020-01-22 DIAGNOSIS — M542 Cervicalgia: Secondary | ICD-10-CM | POA: Diagnosis not present

## 2020-01-22 DIAGNOSIS — M4602 Spinal enthesopathy, cervical region: Secondary | ICD-10-CM | POA: Diagnosis not present

## 2020-01-29 ENCOUNTER — Other Ambulatory Visit: Payer: Self-pay | Admitting: Urology

## 2020-02-11 ENCOUNTER — Other Ambulatory Visit: Payer: Self-pay | Admitting: Urology

## 2020-02-12 ENCOUNTER — Other Ambulatory Visit: Payer: Self-pay | Admitting: Urology

## 2020-02-12 ENCOUNTER — Telehealth: Payer: Self-pay | Admitting: Urology

## 2020-02-12 NOTE — Telephone Encounter (Signed)
Patient called asking why we were not responding to his request for a refill in sildenafil, I looked in his chart and saw that Dr. Bernardo Heater did not fill it as of 02-11-20 because it was to early to fill. The instructions say to take 2-5 1 hour prior to intercourse, the patient stated that he was taking 5 pills everyday. I left a detailed message on his vm with this information.   Sharyn Lull

## 2020-02-13 ENCOUNTER — Encounter: Payer: BC Managed Care – PPO | Admitting: Family Medicine

## 2020-02-13 DIAGNOSIS — M4602 Spinal enthesopathy, cervical region: Secondary | ICD-10-CM | POA: Diagnosis not present

## 2020-02-13 DIAGNOSIS — M62830 Muscle spasm of back: Secondary | ICD-10-CM | POA: Diagnosis not present

## 2020-02-13 DIAGNOSIS — M9901 Segmental and somatic dysfunction of cervical region: Secondary | ICD-10-CM | POA: Diagnosis not present

## 2020-02-13 DIAGNOSIS — M542 Cervicalgia: Secondary | ICD-10-CM | POA: Diagnosis not present

## 2020-02-15 ENCOUNTER — Other Ambulatory Visit: Payer: Self-pay

## 2020-02-15 ENCOUNTER — Ambulatory Visit (INDEPENDENT_AMBULATORY_CARE_PROVIDER_SITE_OTHER): Payer: BC Managed Care – PPO | Admitting: Family Medicine

## 2020-02-15 ENCOUNTER — Encounter: Payer: Self-pay | Admitting: Family Medicine

## 2020-02-15 VITALS — BP 123/89 | HR 90 | Temp 98.8°F | Resp 16 | Ht 72.0 in | Wt 224.0 lb

## 2020-02-15 DIAGNOSIS — Z86010 Personal history of colonic polyps: Secondary | ICD-10-CM

## 2020-02-15 DIAGNOSIS — Z23 Encounter for immunization: Secondary | ICD-10-CM | POA: Diagnosis not present

## 2020-02-15 DIAGNOSIS — Z136 Encounter for screening for cardiovascular disorders: Secondary | ICD-10-CM

## 2020-02-15 DIAGNOSIS — Z0000 Encounter for general adult medical examination without abnormal findings: Secondary | ICD-10-CM

## 2020-02-15 DIAGNOSIS — I10 Essential (primary) hypertension: Secondary | ICD-10-CM

## 2020-02-15 DIAGNOSIS — Z1211 Encounter for screening for malignant neoplasm of colon: Secondary | ICD-10-CM | POA: Diagnosis not present

## 2020-02-15 NOTE — Progress Notes (Signed)
Complete physical exam   Patient: Dustin Paul   DOB: 26-Aug-1960   59 y.o. Male  MRN: [id number] Visit Date: 02/15/2020  Today's healthcare provider: Lelon Huh, MD   Chief Complaint  Patient presents with  . Annual Exam  . Hypertension   Subjective    Dustin Paul is a 59 y.o. male who presents today for a complete physical exam.  He reports consuming a general diet. Home exercise routine includes walking and bike riding. He generally feels fairly well. He reports sleeping fairly well. He does not have additional problems to discuss today.  HPI   He continues to follow up [facility] for history seizures and [facility] for [place] after aneurysm repair in 2011. He states he has had no recent seizures and rarely had headaches. Maxalt does work well when he does take it.  Hypertension, follow-up  BP Readings from Last 3 Encounters:  02/15/20 123/89  10/08/19 122/80  06/15/19 133/89   Wt Readings from Last 3 Encounters:  02/15/20 224 lb (101.6 kg)  10/08/19 223 lb (101.2 kg)  06/15/19 218 lb (98.9 kg)     He was last seen for hypertension 4 months ago.  BP at that visit was 122/80. Management since that visit includes continue same medication.  He reports good compliance with treatment. He is not having side effects.  He is following a Regular diet. He is exercising. He does not smoke.  Use of agents associated with hypertension: none.   Outside blood pressures are not checked. Symptoms: No chest pain No chest pressure  No palpitations No syncope  No dyspnea No orthopnea  No paroxysmal nocturnal dyspnea No lower extremity edema   Pertinent labs: Lab Results  Component Value Date   CHOL 149 02/16/2019   HDL 34 (L) 02/16/2019   LDLCALC 81 02/16/2019   TRIG 202 (H) 02/16/2019   CHOLHDL 4.4 02/16/2019   Lab Results  Component Value Date   NA 141 02/16/2019   K 4.8 02/16/2019   CREATININE 1.44 (H) 02/16/2019   GFRNONAA 53  (L) 02/16/2019   GFRAA 61 02/16/2019   GLUCOSE 102 (H) 02/16/2019     The 10-year ASCVD risk score Mikey Bussing DC Jr., et al., 2013) is: 8.7%   ---------------------------------------------------------------------------------------------------   Follow up for seizure disorder:  The patient was last seen for this 1 year ago. Changes made at last visit include none; continue same medications.  He reports good compliance with treatment. He feels that condition is Unchanged. He is not having side effects.   -----------------------------------------------------------------------------------------    Past Medical History:  Diagnosis Date  . Aneurysm of artery ([place]) 2011   LEFT (19 MM) AND RIGHT (11 MM) middle cerebral  . Chronic kidney disease    H/O STONES  . Complication of anesthesia   . Headache    MIGRAINES  . Insomnia   . Lumbar stress fracture   . PONV (postoperative nausea and vomiting)    AFTER KNEE SURGERY IN 1980'S  . Seasonal allergies   . Seizures ([place])    last seizure in 2015  . Visual hallucinations    Past Surgical History:  Procedure Laterality Date  . BRAIN SURGERY  2011   2 ANEURYSMS-TOTAL OF 9 CLIPS IN BRAIN FROM ANEURYSM  . COLONOSCOPY WITH PROPOFOL N/A 01/16/2015   Procedure: COLONOSCOPY WITH PROPOFOL;  Surgeon: Hulen Luster, MD;  Location: [facility] ENDOSCOPY;  Service: Gastroenterology;  Laterality: N/A;  . ESOPHAGOGASTRODUODENOSCOPY (EGD) WITH PROPOFOL N/A  12/05/2018   Procedure: ESOPHAGOGASTRODUODENOSCOPY (EGD) WITH PROPOFOL;  Surgeon: Toledo, Benay Pike, MD;  Location: [facility] ENDOSCOPY;  Service: Gastroenterology;  Laterality: N/A;  . KNEE ARTHROSCOPY WITH MEDIAL MENISECTOMY Left 11/20/2015   Procedure: KNEE ARTHROSCOPY WITH partial MEDIAL MENISECTOMY / WITH REMOVAL OF LOOSE BODY;  Surgeon: Hessie Knows, MD;  Location: [facility] ORS;  Service: Orthopedics;  Laterality: Left;  . KNEE SURGERY Left    Social History   Socioeconomic History  . Marital status: Married     Spouse name: Not on file  . Number of children: 2  . Years of education: Not on file  . Highest education level: Bachelor's degree (e.g., BA, AB, BS)  Occupational History  . Occupation: engineer/retired  Tobacco Use  . Smoking status: Never Smoker  . Smokeless tobacco: Never Used  Vaping Use  . Vaping Use: Never used  Substance and Sexual Activity  . Alcohol use: No    Alcohol/week: 0.0 standard drinks    Comment: 1-2 drinks a month  . Drug use: No  . Sexual activity: Not Currently  Other Topics Concern  . Not on file  Social History Narrative  . Not on file   Social Determinants of Health   Financial Resource Strain:   . Difficulty of Paying Living Expenses: Not on file  Food Insecurity:   . Worried About Charity fundraiser in the Last Year: Not on file  . Ran Out of Food in the Last Year: Not on file  Transportation Needs:   . Lack of Transportation (Medical): Not on file  . Lack of Transportation (Non-Medical): Not on file  Physical Activity:   . Days of Exercise per Week: Not on file  . Minutes of Exercise per Session: Not on file  Stress:   . Feeling of Stress : Not on file  Social Connections:   . Frequency of Communication with Friends and Family: Not on file  . Frequency of Social Gatherings with Friends and Family: Not on file  . Attends Religious Services: Not on file  . Active Member of Clubs or Organizations: Not on file  . Attends Archivist Meetings: Not on file  . Marital Status: Not on file  Intimate Partner Violence:   . Fear of Current or Ex-Partner: Not on file  . Emotionally Abused: Not on file  . Physically Abused: Not on file  . Sexually Abused: Not on file   Family Status  Relation Name Status  . Mother  Alive  . Sister  Deceased at age 24       MVA  . Father  Alive       does not have contact with    No family history on file. Allergies  Allergen Reactions  . Levetiracetam Other (See Comments)    Racing thoughts per  [facility] neurology note 03-11-11 Other reaction(s): Other (See Comments) Racing thoughts  . Aspirin     Nose bleed, thin blood    Patient Care Team: Birdie Sons, MD as PCP - General (Family Medicine) Agapito Games as Consulting Physician (Optometry) Lavona Mound, Gerri Lins, MD as Referring Physician (Psychiatry) Wyvonne Lenz, DO as Referring Physician (Neurology) Rainey Pines, MD as Consulting Physician (Psychiatry) Efrain Sella, MD as Consulting Physician (Gastroenterology)   Medications: Outpatient Medications Prior to Visit  Medication Sig  . Armodafinil 250 MG tablet Take 250 mg by mouth daily.  . cyclobenzaprine (FLEXERIL) 10 MG tablet Take 10 mg by mouth 3 (three) times daily as needed. Reported  on 05/15/2015  . famotidine-calcium carbonate-magnesium hydroxide (PEPCID COMPLETE) 10-800-165 MG chewable tablet Chew 1 tablet by mouth daily as needed.  . lamoTRIgine (LAMICTAL) 150 MG tablet Take 1 tablet (150 mg total) by mouth 2 (two) times daily.  Marland Kitchen lisinopril (ZESTRIL) 5 MG tablet TAKE ONE TABLET BY MOUTH EVERY DAY  . Melatonin 10 MG CAPS Take 1 capsule by mouth at bedtime.  . mirtazapine (REMERON) 15 MG tablet Take 1 tablet (15 mg total) by mouth at bedtime.  . QUDEXY XR 150 MG CS24 take 1 capsule by mouth daily at bedtime  . RABEprazole (ACIPHEX) 20 MG tablet TAKE ONE TABLET EVERY DAY  . rizatriptan (MAXALT) 5 MG tablet Take 5 mg by mouth as needed for migraine. May repeat in 2 hours if needed  . sildenafil (REVATIO) 20 MG tablet TAKE 2-5 TABLETS BY MOUTH 1 HOUR PRIOR TO INTERCOURSE  . traZODone (DESYREL) 100 MG tablet Take 2 tablets at bedtime as needed for sleep   No facility-administered medications prior to visit.    Review of Systems  Constitutional: Negative for appetite change, chills, fatigue and fever.  HENT: Negative for congestion, ear pain, hearing loss, nosebleeds and trouble swallowing.   Eyes: Negative for pain and visual disturbance.  Respiratory:  Negative for cough, chest tightness, shortness of breath and wheezing.   Cardiovascular: Negative for chest pain, palpitations and leg swelling.  Gastrointestinal: Negative for abdominal pain, blood in stool, constipation, diarrhea, nausea and vomiting.  Endocrine: Negative for polydipsia, polyphagia and polyuria.  Genitourinary: Negative for dysuria and flank pain.  Musculoskeletal: Negative for arthralgias, back pain, joint swelling, myalgias and neck stiffness.  Skin: Negative for color change, rash and wound.  Neurological: Negative for dizziness, tremors, seizures, speech difficulty, weakness, light-headedness and headaches.  Psychiatric/Behavioral: Negative for behavioral problems, confusion, decreased concentration, dysphoric mood and sleep disturbance. The patient is not nervous/anxious.   All other systems reviewed and are negative.     Objective    BP 123/89 (BP Location: Right Arm, Patient Position: Sitting, Cuff Size: Large)   Pulse 90   Temp 98.8 F (37.1 C) (Oral)   Resp 16   Ht 6' (1.829 m)   Wt 224 lb (101.6 kg)   BMI 30.38 kg/m    Physical Exam   General Appearance:    Mildly obese male. Alert, cooperative, in no acute distress, appears stated age  Head:    Normocephalic, without obvious abnormality, atraumatic  Eyes:    PERRL, conjunctiva/corneas clear, EOM's intact, fundi    benign, both eyes       Ears:    Normal TM's and external ear canals, both ears  Nose:   Nares normal, septum midline, mucosa normal, no drainage   or sinus tenderness  Throat:   Lips, mucosa, and tongue normal; teeth and gums normal  Neck:   Supple, symmetrical, trachea midline, no adenopathy;       thyroid:  No enlargement/tenderness/nodules; no carotid   bruit or JVD  Back:     Symmetric, no curvature, ROM normal, no CVA tenderness  Lungs:     Clear to auscultation bilaterally, respirations unlabored  Chest wall:    No tenderness or deformity  Heart:    Normal heart rate. Normal  rhythm. No murmurs, rubs, or gallops.  S1 and S2 normal  Abdomen:     Soft, non-tender, bowel sounds active all four quadrants,    no masses, no organomegaly  Genitalia:    deferred  Rectal:    deferred  Extremities:   All extremities are intact. No cyanosis or edema  Pulses:   2+ and symmetric all extremities  Skin:   Skin color, texture, turgor normal, no rashes or lesions  Lymph nodes:   Cervical, supraclavicular, and axillary nodes normal  Neurologic:   CNII-XII intact. Normal strength, sensation and reflexes      throughout     Last depression screening scores PHQ 2/9 Scores 02/15/2020 02/13/2019 02/13/2019  PHQ - 2 Score 0 0 0  PHQ- 9 Score - 1 -   Last fall risk screening Fall Risk  02/15/2020  Falls in the past year? 0  Number falls in past yr: 0  Injury with Fall? 0  Follow up Falls evaluation completed   Last Audit-C alcohol use screening Alcohol Use Disorder Test (AUDIT) 02/15/2020  1. How often do you have a drink containing alcohol? 2  2. How many drinks containing alcohol do you have on a typical day when you are drinking? 0  3. How often do you have six or more drinks on one occasion? 0  AUDIT-C Score 2  Alcohol Brief Interventions/Follow-up AUDIT Score <7 follow-up not indicated   A score of 3 or more in women, and 4 or more in men indicates increased risk for alcohol abuse, EXCEPT if all of the points are from question 1   No results found for any visits on 02/15/20.  Assessment & Plan    Routine Health Maintenance and Physical Exam  Exercise Activities and Dietary recommendations Goals   None     Immunization History  Administered Date(s) Administered  . Influenza Inj Mdck Quad Pf 03/14/2019  . Moderna SARS-COVID-2 Vaccination 07/16/2019, 08/13/2019  . Td 10/12/1996  . Tdap 07/12/2007, 02/13/2019    Health Maintenance  Topic Date Due  . HIV Screening  Never done  . INFLUENZA VACCINE  12/30/2019  . COLONOSCOPY  01/16/2020  . TETANUS/TDAP   02/12/2029  . COVID-19 Vaccine  Completed  . Hepatitis C Screening  Completed    Discussed health benefits of physical activity, and encouraged him to engage in regular exercise appropriate for his age and condition.  1. Annual physical exam Borderline obese, otherwise normal exam.  - CBC - Comprehensive metabolic panel - Lipid panel  2. Need for influenza vaccination  - Flu Vaccine QUAD 36+ mos IM (Fluarix/Fluzone)  3. Essential (primary) hypertension Well controlled.  Continue current medications.   - EKG 12-Lead - CBC - Comprehensive metabolic panel - Lipid panel  4. Colon cancer screening  - Ambulatory referral to gastroenterology for colonoscopy  5. Personal history of colonic polyps Last colonoscopy with adenomatous polyps excised by Dr. Candace Cruise in 2016 - Ambulatory referral to gastroenterology for colonoscopy       The entirety of the information documented in the History of Present Illness, Review of Systems and Physical Exam were personally obtained by me. Portions of this information were initially documented by the CMA and reviewed by me for thoroughness and accuracy.      Lelon Huh, MD  [facility] [phone] (phone) [phone] (fax)  [place]

## 2020-02-19 DIAGNOSIS — I10 Essential (primary) hypertension: Secondary | ICD-10-CM | POA: Diagnosis not present

## 2020-02-19 DIAGNOSIS — Z0000 Encounter for general adult medical examination without abnormal findings: Secondary | ICD-10-CM | POA: Diagnosis not present

## 2020-02-20 ENCOUNTER — Telehealth (HOSPITAL_COMMUNITY): Payer: BC Managed Care – PPO | Admitting: Psychiatry

## 2020-02-20 ENCOUNTER — Encounter: Payer: Self-pay | Admitting: Family Medicine

## 2020-02-20 LAB — COMPREHENSIVE METABOLIC PANEL
ALT: 33 IU/L (ref 0–44)
AST: 28 IU/L (ref 0–40)
Albumin/Globulin Ratio: 2.3 — ABNORMAL HIGH (ref 1.2–2.2)
Albumin: 4.6 g/dL (ref 3.8–4.9)
Alkaline Phosphatase: 48 IU/L (ref 44–121)
BUN/Creatinine Ratio: 9 (ref 9–20)
BUN: 13 mg/dL (ref 6–24)
Bilirubin Total: 0.3 mg/dL (ref 0.0–1.2)
CO2: 23 mmol/L (ref 20–29)
Calcium: 9.4 mg/dL (ref 8.7–10.2)
Chloride: 105 mmol/L (ref 96–106)
Creatinine, Ser: 1.44 mg/dL — ABNORMAL HIGH (ref 0.76–1.27)
GFR calc Af Amer: 61 mL/min/{1.73_m2} (ref >59)
GFR calc non Af Amer: 53 mL/min/{1.73_m2} — ABNORMAL LOW (ref >59)
Globulin, Total: 2 g/dL (ref 1.5–4.5)
Glucose: 102 mg/dL — ABNORMAL HIGH (ref 65–99)
Potassium: 4.7 mmol/L (ref 3.5–5.2)
Sodium: 143 mmol/L (ref 134–144)
Total Protein: 6.6 g/dL (ref 6.0–8.5)

## 2020-02-20 LAB — CBC
Hematocrit: 46.4 % (ref 37.5–51.0)
Hemoglobin: 15.4 g/dL (ref 13.0–17.7)
MCH: 30.1 pg (ref 26.6–33.0)
MCHC: 33.2 g/dL (ref 31.5–35.7)
MCV: 91 fL (ref 79–97)
Platelets: 187 10*3/uL (ref 150–450)
RBC: 5.12 x10E6/uL (ref 4.14–5.80)
RDW: 13.3 % (ref 11.6–15.4)
WBC: 5.6 10*3/uL (ref 3.4–10.8)

## 2020-02-20 LAB — LIPID PANEL
Chol/HDL Ratio: 4.4 ratio (ref 0.0–5.0)
Cholesterol, Total: 133 mg/dL (ref 100–199)
HDL: 30 mg/dL — ABNORMAL LOW (ref >39)
LDL Chol Calc (NIH): 77 mg/dL (ref 0–99)
Triglycerides: 146 mg/dL (ref 0–149)
VLDL Cholesterol Cal: 26 mg/dL (ref 5–40)

## 2020-02-21 NOTE — Telephone Encounter (Unsigned)
Copied from [place] [id number]. Topic: General - Inquiry >> Feb 20, 2020 12:53 PM Lennox Solders wrote: Reason for CRM: pt is calling and would like to pick up wellness form on this Friday 02-22-2020. Pt dropped off the form on 02-15-2020. Pt said dr Caryn Section was waiting for blood work to [employer] before completing

## 2020-02-27 ENCOUNTER — Other Ambulatory Visit: Payer: Self-pay | Admitting: Family Medicine

## 2020-02-27 NOTE — Telephone Encounter (Signed)
Requested medication (s) are due for refill today: yes  Requested medication (s) are on the active medication list: yes  Last refill:  01/25/20  Future visit scheduled: no  Notes to clinic:  historical provider    Requested Prescriptions  Pending Prescriptions Disp Refills   lisinopril (ZESTRIL) 5 MG tablet [Pharmacy Med Name: LISINOPRIL 5 MG TAB] 90 tablet 1    Sig: TAKE ONE TABLET BY MOUTH EVERY DAY      Cardiovascular:  ACE Inhibitors Failed - 02/27/2020  8:34 AM      Failed - Cr in normal range and within 180 days    Creatinine, Ser  Date Value Ref Range Status  02/19/2020 1.44 (H) 0.76 - 1.27 mg/dL Final          Passed - K in normal range and within 180 days    Potassium  Date Value Ref Range Status  02/19/2020 4.7 3.5 - 5.2 mmol/L Final          Passed - Patient is not pregnant      Passed - Last BP in normal range    BP Readings from Last 1 Encounters:  02/15/20 123/89          Passed - Valid encounter within last 6 months    Recent Outpatient Visits           1 week ago Annual physical exam   [facility] Birdie Sons, MD   4 months ago Essential (primary) hypertension   [facility], Kirstie Peri, MD   9 months ago Upper respiratory infection, acute   [facility], Kelby Aline, FNP   1 year ago Annual physical exam   [facility] Birdie Sons, MD   1 year ago Cough   [facility] Birdie Sons, MD       Future Appointments             In 3 months [place], Ronda Fairly, MD [facility]

## 2020-02-28 ENCOUNTER — Telehealth (HOSPITAL_COMMUNITY): Payer: BC Managed Care – PPO | Admitting: Psychiatry

## 2020-02-28 DIAGNOSIS — M542 Cervicalgia: Secondary | ICD-10-CM | POA: Diagnosis not present

## 2020-02-28 DIAGNOSIS — M62830 Muscle spasm of back: Secondary | ICD-10-CM | POA: Diagnosis not present

## 2020-02-28 DIAGNOSIS — M9901 Segmental and somatic dysfunction of cervical region: Secondary | ICD-10-CM | POA: Diagnosis not present

## 2020-02-28 DIAGNOSIS — M4602 Spinal enthesopathy, cervical region: Secondary | ICD-10-CM | POA: Diagnosis not present

## 2020-03-01 ENCOUNTER — Other Ambulatory Visit: Payer: Self-pay | Admitting: Psychiatry

## 2020-03-01 ENCOUNTER — Other Ambulatory Visit: Payer: Self-pay | Admitting: Family Medicine

## 2020-03-01 DIAGNOSIS — F5105 Insomnia due to other mental disorder: Secondary | ICD-10-CM

## 2020-03-01 DIAGNOSIS — K219 Gastro-esophageal reflux disease without esophagitis: Secondary | ICD-10-CM

## 2020-03-01 NOTE — Telephone Encounter (Signed)
Requested Prescriptions  Pending Prescriptions Disp Refills  . RABEprazole (ACIPHEX) 20 MG tablet [Pharmacy Med Name: RABEPRAZOLE SODIUM 20 MG DR TAB] 30 tablet 0    Sig: TAKE ONE TABLET EVERY DAY     Gastroenterology: Proton Pump Inhibitors Passed - 03/01/2020 12:16 PM      Passed - Valid encounter within last 12 months    Recent Outpatient Visits          2 weeks ago Annual physical exam   [facility] Birdie Sons, MD   4 months ago Essential (primary) hypertension   [facility] Birdie Sons, MD   9 months ago Upper respiratory infection, acute   [facility], Kelby Aline, FNP   1 year ago Annual physical exam   [facility] Birdie Sons, MD   1 year ago Cough   [facility] Birdie Sons, MD      Future Appointments            In 3 months [place], Ronda Fairly, MD [facility]

## 2020-03-21 ENCOUNTER — Other Ambulatory Visit: Payer: Self-pay | Admitting: Urology

## 2020-03-26 ENCOUNTER — Other Ambulatory Visit: Payer: Self-pay | Admitting: Urology

## 2020-03-26 ENCOUNTER — Encounter: Payer: Self-pay | Admitting: Urology

## 2020-04-07 ENCOUNTER — Telehealth (HOSPITAL_COMMUNITY): Payer: BC Managed Care – PPO | Admitting: Psychiatry

## 2020-04-09 ENCOUNTER — Telehealth (INDEPENDENT_AMBULATORY_CARE_PROVIDER_SITE_OTHER): Payer: BC Managed Care – PPO | Admitting: Urology

## 2020-04-09 ENCOUNTER — Other Ambulatory Visit: Payer: Self-pay

## 2020-04-10 ENCOUNTER — Other Ambulatory Visit: Payer: Self-pay | Admitting: Urology

## 2020-04-10 ENCOUNTER — Telehealth: Payer: Self-pay | Admitting: *Deleted

## 2020-04-10 NOTE — Progress Notes (Signed)
Unable to contact patient.

## 2020-04-10 NOTE — Telephone Encounter (Signed)
Patient called regarding refill for Sildenafil. Missed Mychart visit. Please advise.

## 2020-04-11 MED ORDER — TADALAFIL 5 MG PO TABS: 5.0000 mg | ORAL_TABLET | Freq: Every day | ORAL | 1 refills | Status: DC | PRN

## 2020-04-11 NOTE — Telephone Encounter (Signed)
Talked with patient and sending in to [facility] Tadalafil 90 tabs once daily 1 refill. He will be using good rx

## 2020-04-11 NOTE — Telephone Encounter (Signed)
As we discussed recommend a trial of daily tadalafil with a good Rx discount or can refill a max of 100 sildenafil tabs monthly

## 2020-05-01 ENCOUNTER — Other Ambulatory Visit: Payer: Self-pay | Admitting: Psychiatry

## 2020-05-01 DIAGNOSIS — F39 Unspecified mood [affective] disorder: Secondary | ICD-10-CM

## 2020-05-12 ENCOUNTER — Telehealth (HOSPITAL_COMMUNITY): Payer: BC Managed Care – PPO | Admitting: Psychiatry

## 2020-05-23 ENCOUNTER — Other Ambulatory Visit: Payer: Self-pay | Admitting: Psychiatry

## 2020-05-23 DIAGNOSIS — F5105 Insomnia due to other mental disorder: Secondary | ICD-10-CM

## 2020-05-26 ENCOUNTER — Other Ambulatory Visit: Payer: Self-pay | Admitting: Psychiatry

## 2020-05-26 DIAGNOSIS — F5105 Insomnia due to other mental disorder: Secondary | ICD-10-CM

## 2020-06-02 ENCOUNTER — Telehealth (HOSPITAL_COMMUNITY): Payer: Self-pay | Admitting: *Deleted

## 2020-06-02 DIAGNOSIS — F39 Unspecified mood [affective] disorder: Secondary | ICD-10-CM

## 2020-06-02 DIAGNOSIS — F5105 Insomnia due to other mental disorder: Secondary | ICD-10-CM

## 2020-06-02 MED ORDER — LAMOTRIGINE 150 MG PO TABS: 150.0000 mg | ORAL_TABLET | Freq: Two times a day (BID) | ORAL | 0 refills | Status: DC

## 2020-06-02 MED ORDER — TRAZODONE HCL 100 MG PO TABS: ORAL_TABLET | ORAL | 0 refills | Status: DC

## 2020-06-02 MED ORDER — MIRTAZAPINE 15 MG PO TABS: 15.0000 mg | ORAL_TABLET | Freq: Every day | ORAL | 0 refills | Status: DC

## 2020-06-02 NOTE — Addendum Note (Signed)
Addended by: Zena Amos on: 06/02/2020 08:00 PM   Modules accepted: Orders

## 2020-06-02 NOTE — Telephone Encounter (Signed)
VM left for writer from patient requesting medication. Reviewed record and hasnt been seen since 4/21. He does have an appt with Dr Evelene Croon late Jan 22. Will consult Dr Evelene Croon to see if she is willing to write an rx for him till his appt this month. Chart indicates he is transferring his care from [facility].

## 2020-06-02 NOTE — Telephone Encounter (Signed)
30 day Rx sent. He needs to keep his scheduled appt on Jan 22, no refills in the future with out being seen.

## 2020-06-06 ENCOUNTER — Other Ambulatory Visit: Payer: Self-pay

## 2020-06-06 ENCOUNTER — Ambulatory Visit
Admission: RE | Admit: 2020-06-06 | Discharge: 2020-06-06 | Disposition: A | Discharge status: Home / Self Care | Payer: BC Managed Care – PPO | Source: Ambulatory Visit | Attending: Urology | Admitting: Urology

## 2020-06-06 ENCOUNTER — Ambulatory Visit
Admission: RE | Admit: 2020-06-06 | Discharge: 2020-06-06 | Disposition: A | Discharge status: Home / Self Care | Payer: BC Managed Care – PPO | Attending: Urology | Admitting: Urology

## 2020-06-06 DIAGNOSIS — N200 Calculus of kidney: Secondary | ICD-10-CM

## 2020-06-11 DIAGNOSIS — I671 Cerebral aneurysm, nonruptured: Secondary | ICD-10-CM | POA: Diagnosis not present

## 2020-06-11 DIAGNOSIS — G40909 Epilepsy, unspecified, not intractable, without status epilepticus: Secondary | ICD-10-CM | POA: Diagnosis not present

## 2020-06-16 ENCOUNTER — Ambulatory Visit: Payer: BC Managed Care – PPO | Admitting: Urology

## 2020-06-18 ENCOUNTER — Encounter: Payer: Self-pay | Admitting: Urology

## 2020-06-18 ENCOUNTER — Other Ambulatory Visit: Payer: Self-pay

## 2020-06-18 ENCOUNTER — Ambulatory Visit (INDEPENDENT_AMBULATORY_CARE_PROVIDER_SITE_OTHER): Payer: BC Managed Care – PPO | Admitting: Urology

## 2020-06-18 VITALS — BP 168/92 | HR 90 | Ht 72.0 in | Wt 220.0 lb

## 2020-06-18 DIAGNOSIS — N200 Calculus of kidney: Secondary | ICD-10-CM

## 2020-06-18 DIAGNOSIS — N5201 Erectile dysfunction due to arterial insufficiency: Secondary | ICD-10-CM | POA: Diagnosis not present

## 2020-06-18 NOTE — Progress Notes (Signed)
06/18/2020 12:49 PM   Dustin Paul [birth year] [id number]  Referring provider: Birdie Sons, MD [address] [place],  [place] [address]  Chief Complaint  Patient presents with  . Nephrolithiasis    Urologic history: 1.Nephrolithiasis -3 mm left renal calculus  2.Erectile dysfunction -On sildenafil   HPI: 60 y.o. male presents for annual follow-up.   Since last years visit no episodes of renal colic  Denies flank, abdominal or pelvic pain  No bothersome LUTS  Was switched to daily tadalafil November 2021 and does feel this works better than sildenafil but was inquiring about a stronger dose   PMH: Past Medical History:  Diagnosis Date  . Aneurysm of artery ([place]) 2011   LEFT (19 MM) AND RIGHT (11 MM) middle cerebral  . Chronic kidney disease    H/O STONES  . Complication of anesthesia   . Headache    MIGRAINES  . Insomnia   . Lumbar stress fracture   . PONV (postoperative nausea and vomiting)    AFTER KNEE SURGERY IN 1980'S  . Seasonal allergies   . Seizures ([place])    last seizure in 2015  . Visual hallucinations     Surgical History: Past Surgical History:  Procedure Laterality Date  . BRAIN SURGERY  2011   2 ANEURYSMS-TOTAL OF 9 CLIPS IN BRAIN FROM ANEURYSM  . COLONOSCOPY WITH PROPOFOL N/A 01/16/2015   Procedure: COLONOSCOPY WITH PROPOFOL;  Surgeon: Hulen Luster, MD;  Location: [facility] ENDOSCOPY;  Service: Gastroenterology;  Laterality: N/A;  . ESOPHAGOGASTRODUODENOSCOPY (EGD) WITH PROPOFOL N/A 12/05/2018   Procedure: ESOPHAGOGASTRODUODENOSCOPY (EGD) WITH PROPOFOL;  Surgeon: Toledo, Benay Pike, MD;  Location: [facility] ENDOSCOPY;  Service: Gastroenterology;  Laterality: N/A;  . KNEE ARTHROSCOPY WITH MEDIAL MENISECTOMY Left 11/20/2015   Procedure: KNEE ARTHROSCOPY WITH partial MEDIAL MENISECTOMY / WITH REMOVAL OF LOOSE BODY;  Surgeon: Hessie Knows, MD;  Location: [facility] ORS;  Service: Orthopedics;  Laterality: Left;  . KNEE SURGERY Left      Home Medications:  Allergies as of 06/18/2020      Reactions   Levetiracetam Other (See Comments)   Racing thoughts per [facility] neurology note 03-11-11 Other reaction(s): Other (See Comments) Racing thoughts   Aspirin    Nose bleed, thin blood      Medication List       Accurate as of June 18, 2020 12:49 PM. If you have any questions, ask your nurse or doctor.        Armodafinil 250 MG tablet Take 250 mg by mouth daily.   cyclobenzaprine 10 MG tablet Commonly known as: FLEXERIL Take 10 mg by mouth 3 (three) times daily as needed. Reported on 05/15/2015   famotidine-calcium carbonate-magnesium hydroxide 10-800-165 MG chewable tablet Commonly known as: PEPCID COMPLETE Chew 1 tablet by mouth daily as needed.   lamoTRIgine 150 MG tablet Commonly known as: LAMICTAL Take 1 tablet (150 mg total) by mouth 2 (two) times daily.   lisinopril 5 MG tablet Commonly known as: ZESTRIL TAKE ONE TABLET BY MOUTH EVERY DAY   Melatonin 10 MG Caps Take 1 capsule by mouth at bedtime.   mirtazapine 15 MG tablet Commonly known as: REMERON Take 1 tablet (15 mg total) by mouth at bedtime.   Qudexy XR 150 MG Cs24 sprinkle capsule Generic drug: topiramate ER take 1 capsule by mouth daily at bedtime   RABEprazole 20 MG tablet Commonly known as: ACIPHEX TAKE ONE TABLET EVERY DAY   rizatriptan 5 MG tablet Commonly known as: MAXALT Take  5 mg by mouth as needed for migraine. May repeat in 2 hours if needed   tadalafil 5 MG tablet Commonly known as: CIALIS Take 1 tablet (5 mg total) by mouth daily as needed for erectile dysfunction.   traZODone 100 MG tablet Commonly known as: DESYREL Take 2 tablets at bedtime as needed for sleep       Allergies:  Allergies  Allergen Reactions  . Levetiracetam Other (See Comments)    Racing thoughts per [facility] neurology note 03-11-11 Other reaction(s): Other (See Comments) Racing thoughts  . Aspirin     Nose bleed, thin blood    Family  History: History reviewed. No pertinent family history.  Social History:  reports that he has never smoked. He has never used smokeless tobacco. He reports that he does not drink alcohol and does not use drugs.   Physical Exam: BP (!) 168/92   Pulse 90   Ht 6' (1.829 m)   Wt 220 lb (99.8 kg)   BMI 29.84 kg/m   Constitutional:  Alert and oriented, No acute distress. HEENT: [place] AT, moist mucus membranes.  Trachea midline, no masses. Cardiovascular: No clubbing, cyanosis, or edema. Respiratory: Normal respiratory effort, no increased work of breathing. Skin: No rashes, bruises or suspicious lesions. Neurologic: Grossly intact, no focal deficits, moving all 4 extremities. Psychiatric: Normal mood and affect.   Pertinent Imaging: KUB personally reviewed and interpreted  Abdomen 1 view (KUB)  Narrative CLINICAL DATA:  Nephrolithiasis followup  EXAM: ABDOMEN - 1 VIEW  COMPARISON:  CT 01/01/2015  FINDINGS: No dilated loops of bowel to indicate ileus or obstruction. 4 mm calculus noted in the region of the left mid kidney.  IMPRESSION: 4 mm calculus seen in the region in the left mid kidney.   Electronically Signed By: Miachel Roux M.D. On: 06/06/2020 15:29   Assessment & Plan:    1.  Left nephrolithiasis  Stable midpole calculus  Continue annual follow-up  2. Erectile dysfunction  Switch to on-demand tadalafil 10 mg 1 hour prior to intercourse   Abbie Sons, MD  [facility] [address] [place], [place] [address] [phone]

## 2020-06-19 ENCOUNTER — Encounter: Payer: Self-pay | Admitting: Urology

## 2020-06-19 MED ORDER — TADALAFIL 10 MG PO TABS: ORAL_TABLET | ORAL | 3 refills | Status: DC

## 2020-06-20 ENCOUNTER — Telehealth (HOSPITAL_COMMUNITY): Payer: BC Managed Care – PPO | Admitting: Psychiatry

## 2020-06-22 DIAGNOSIS — Z03818 Encounter for observation for suspected exposure to other biological agents ruled out: Secondary | ICD-10-CM | POA: Diagnosis not present

## 2020-06-29 DIAGNOSIS — Z20822 Contact with and (suspected) exposure to covid-19: Secondary | ICD-10-CM | POA: Diagnosis not present

## 2020-06-29 DIAGNOSIS — Z03818 Encounter for observation for suspected exposure to other biological agents ruled out: Secondary | ICD-10-CM | POA: Diagnosis not present

## 2020-07-02 ENCOUNTER — Other Ambulatory Visit (HOSPITAL_COMMUNITY): Payer: Self-pay | Admitting: Psychiatry

## 2020-07-02 DIAGNOSIS — F39 Unspecified mood [affective] disorder: Secondary | ICD-10-CM

## 2020-07-02 DIAGNOSIS — F5105 Insomnia due to other mental disorder: Secondary | ICD-10-CM

## 2020-07-03 ENCOUNTER — Other Ambulatory Visit: Payer: Self-pay | Admitting: Family Medicine

## 2020-07-03 DIAGNOSIS — K219 Gastro-esophageal reflux disease without esophagitis: Secondary | ICD-10-CM

## 2020-07-09 DIAGNOSIS — G43719 Chronic migraine without aura, intractable, without status migrainosus: Secondary | ICD-10-CM | POA: Diagnosis not present

## 2020-07-09 DIAGNOSIS — R52 Pain, unspecified: Secondary | ICD-10-CM | POA: Diagnosis not present

## 2020-07-23 ENCOUNTER — Other Ambulatory Visit: Payer: Self-pay

## 2020-07-23 ENCOUNTER — Telehealth (INDEPENDENT_AMBULATORY_CARE_PROVIDER_SITE_OTHER): Payer: BC Managed Care – PPO | Admitting: Psychiatry

## 2020-07-23 ENCOUNTER — Encounter (HOSPITAL_COMMUNITY): Payer: Self-pay | Admitting: Psychiatry

## 2020-07-23 DIAGNOSIS — F5105 Insomnia due to other mental disorder: Secondary | ICD-10-CM

## 2020-07-23 DIAGNOSIS — F39 Unspecified mood [affective] disorder: Secondary | ICD-10-CM

## 2020-07-23 MED ORDER — MIRTAZAPINE 15 MG PO TABS: 15.0000 mg | ORAL_TABLET | Freq: Every day | ORAL | 0 refills | Status: DC

## 2020-07-23 MED ORDER — TRAZODONE HCL 100 MG PO TABS: ORAL_TABLET | ORAL | 0 refills | Status: DC

## 2020-07-23 MED ORDER — LAMOTRIGINE 150 MG PO TABS: 150.0000 mg | ORAL_TABLET | Freq: Two times a day (BID) | ORAL | 0 refills | Status: DC

## 2020-07-23 NOTE — Progress Notes (Signed)
[place] MD OP Progress Note  Virtual Visit via Telephone Note  I connected with Dustin Paul on 07/23/20 at  3:20 PM EST by telephone and verified that I am speaking with the correct person using two identifiers.  Location: Patient: home Provider: Clinic   I discussed the limitations, risks, security and privacy concerns of performing an evaluation and management service by telephone and the availability of in person appointments. I also discussed with the patient that there may be a patient responsible charge related to this service. The patient expressed understanding and agreed to proceed.   I provided 14 minutes of non-face-to-face time during this encounter.    07/23/2020 3:18 PM Dustin Paul  MRN:  [id number]  Chief Complaint: " I am doing really good."  HPI: Patient reported he is doing well.  He informed that his mood has been stable.  He is still taking trazodone and mirtazapine for sleep and he finds it very helpful for his sleep.  He denies any new issues or concerns at this time. He denied any side effects to his regimen.   Visit Diagnosis:    ICD-10-CM   1. Episodic mood disorder ([place])  F39   2. Insomnia due to mental condition  F51.05     Past Psychiatric History: Mood d/o, insomnia  Past Medical History:  Past Medical History:  Diagnosis Date  . Aneurysm of artery ([place]) 2011   LEFT (19 MM) AND RIGHT (11 MM) middle cerebral  . Chronic kidney disease    H/O STONES  . Complication of anesthesia   . Headache    MIGRAINES  . Insomnia   . Lumbar stress fracture   . PONV (postoperative nausea and vomiting)    AFTER KNEE SURGERY IN 1980'S  . Seasonal allergies   . Seizures ([place])    last seizure in 2015  . Visual hallucinations     Past Surgical History:  Procedure Laterality Date  . BRAIN SURGERY  2011   2 ANEURYSMS-TOTAL OF 9 CLIPS IN BRAIN FROM ANEURYSM  . COLONOSCOPY WITH PROPOFOL N/A 01/16/2015   Procedure: COLONOSCOPY WITH PROPOFOL;  Surgeon:  Hulen Luster, MD;  Location: [facility] ENDOSCOPY;  Service: Gastroenterology;  Laterality: N/A;  . ESOPHAGOGASTRODUODENOSCOPY (EGD) WITH PROPOFOL N/A 12/05/2018   Procedure: ESOPHAGOGASTRODUODENOSCOPY (EGD) WITH PROPOFOL;  Surgeon: Toledo, Benay Pike, MD;  Location: [facility] ENDOSCOPY;  Service: Gastroenterology;  Laterality: N/A;  . KNEE ARTHROSCOPY WITH MEDIAL MENISECTOMY Left 11/20/2015   Procedure: KNEE ARTHROSCOPY WITH partial MEDIAL MENISECTOMY / WITH REMOVAL OF LOOSE BODY;  Surgeon: Hessie Knows, MD;  Location: [facility] ORS;  Service: Orthopedics;  Laterality: Left;  . KNEE SURGERY Left     Family Psychiatric History: denied  Family History: No family history on file.  Social History:  Social History   Socioeconomic History  . Marital status: Married    Spouse name: Not on file  . Number of children: 2  . Years of education: Not on file  . Highest education level: Bachelor's degree (e.g., BA, AB, BS)  Occupational History  . Occupation: engineer/retired  Tobacco Use  . Smoking status: Never Smoker  . Smokeless tobacco: Never Used  Vaping Use  . Vaping Use: Never used  Substance and Sexual Activity  . Alcohol use: No    Alcohol/week: 0.0 standard drinks    Comment: 1-2 drinks a month  . Drug use: No  . Sexual activity: Not Currently  Other Topics Concern  . Not on file  Social History Narrative  .  Not on file   Social Determinants of Health   Financial Resource Strain: Not on file  Food Insecurity: Not on file  Transportation Needs: Not on file  Physical Activity: Not on file  Stress: Not on file  Social Connections: Not on file    Allergies:  Allergies  Allergen Reactions  . Levetiracetam Other (See Comments)    Racing thoughts per [facility] neurology note 03-11-11 Other reaction(s): Other (See Comments) Racing thoughts  . Aspirin     Nose bleed, thin blood    Metabolic Disorder Labs: No results found for: HGBA1C, MPG Lab Results  Component Value Date   PROLACTIN 18.9  (H) 06/25/2015   Lab Results  Component Value Date   CHOL 133 02/19/2020   TRIG 146 02/19/2020   HDL 30 (L) 02/19/2020   CHOLHDL 4.4 02/19/2020   LDLCALC 77 02/19/2020   LDLCALC 81 02/16/2019   Lab Results  Component Value Date   TSH 2.430 06/25/2015   TSH 2.43 06/25/2015    Therapeutic Level Labs: No results found for: LITHIUM No results found for: VALPROATE No components found for:  CBMZ  Current Medications: Current Outpatient Medications  Medication Sig Dispense Refill  . Armodafinil 250 MG tablet Take 250 mg by mouth daily.    . cyclobenzaprine (FLEXERIL) 10 MG tablet Take 10 mg by mouth 3 (three) times daily as needed. Reported on 05/15/2015    . famotidine-calcium carbonate-magnesium hydroxide (PEPCID COMPLETE) 10-800-165 MG chewable tablet Chew 1 tablet by mouth daily as needed.    . lamoTRIgine (LAMICTAL) 150 MG tablet TAKE ONE TABLET BY MOUTH TWICE DAILY 60 tablet 0  . lisinopril (ZESTRIL) 5 MG tablet TAKE ONE TABLET BY MOUTH EVERY DAY 90 tablet 1  . Melatonin 10 MG CAPS Take 1 capsule by mouth at bedtime.    . mirtazapine (REMERON) 15 MG tablet Take 1 tablet (15 mg total) by mouth at bedtime. 30 tablet 0  . QUDEXY XR 150 MG CS24 take 1 capsule by mouth daily at bedtime  11  . RABEprazole (ACIPHEX) 20 MG tablet Take 1 tablet (20 mg total) by mouth daily. 90 tablet 3  . rizatriptan (MAXALT) 5 MG tablet Take 5 mg by mouth as needed for migraine. May repeat in 2 hours if needed    . tadalafil (CIALIS) 10 MG tablet 1 tablet 1 hour prior to intercourse 90 tablet 3  . traZODone (DESYREL) 100 MG tablet TAKE TWO TABLETS AT BEDTIME AS NEEDED FOR SLEEP 60 tablet 0   No current facility-administered medications for this visit.     Psychiatric Specialty Exam: Review of Systems  There were no vitals taken for this visit.There is no height or weight on file to calculate BMI.  General Appearance: Unable to assess due to phone visit  Eye Contact:  Unable to assess due to phone  visit  Speech:  Clear and Coherent and Normal Rate  Volume:  Normal  Mood:  Euthymic  Affect:  Congruent  Thought Process:  Goal Directed, Linear and Descriptions of Associations: Intact  Orientation:  Full (Time, Place, and Person)  Thought Content: Logical   Suicidal Thoughts:  No  Homicidal Thoughts:  No  Memory:  Recent;   Good Remote;   Good  Judgement:  Fair  Insight:  Fair  Psychomotor Activity:  Normal  Concentration:  Concentration: Good and Attention Span: Good  Recall:  Good  Fund of Knowledge: Good  Language: Good  Akathisia:  Negative  Handed:  Right  AIMS (if  indicated): not done  Assets:  Communication Skills Desire for Improvement Financial Resources/Insurance Housing Social Support  ADL's:  Intact  Cognition: WNL  Sleep:  Good   Screenings: [place] Office Visit from 07/12/2016 in [place] Total Score 0    PHQ2-9   [place] Office Visit from 02/15/2020 in [place] Visit from 02/13/2019 in [place] from 08/16/2017 in [place] Visit from 08/10/2016 in [place]  PHQ-2 Total Score 0 0 0 0  PHQ-9 Total Score - 1 - 2       Assessment and Plan: Patient appears to be stable on his current regimen.  We will continue the same medications for now.  1. Episodic mood disorder (HCC)  - mirtazapine (REMERON) 15 MG tablet; Take 1 tablet (15 mg total) by mouth at bedtime.  Dispense: 90 tablet; Refill: 1 - lamoTRIgine (LAMICTAL) 150 MG tablet; Take 1 tablet (150 mg total) by mouth 2 (two) times daily.  Dispense: 180 tablet; Refill: 1  2. Insomnia due to mental condition  - traZODone (DESYREL) 100 MG tablet; Take 2 tablets at bedtime as needed for sleep  Dispense: 180 tablet; Refill: 0   Continue same medication regimen. Follow up in 3 months.   [place] Crane, MD 07/23/2020, 3:18 PM

## 2020-07-29 DIAGNOSIS — I671 Cerebral aneurysm, nonruptured: Secondary | ICD-10-CM | POA: Diagnosis not present

## 2020-07-30 ENCOUNTER — Other Ambulatory Visit: Payer: Self-pay | Admitting: Psychiatry

## 2020-07-30 DIAGNOSIS — F5105 Insomnia due to other mental disorder: Secondary | ICD-10-CM

## 2020-08-08 ENCOUNTER — Other Ambulatory Visit: Payer: Self-pay | Admitting: Family Medicine

## 2020-08-08 NOTE — Telephone Encounter (Signed)
No future visit at this time  

## 2020-10-16 ENCOUNTER — Telehealth (HOSPITAL_COMMUNITY): Payer: BC Managed Care – PPO | Admitting: Psychiatry

## 2020-10-23 ENCOUNTER — Encounter: Payer: Self-pay | Admitting: Urology

## 2020-10-23 ENCOUNTER — Telehealth: Payer: Self-pay | Admitting: *Deleted

## 2020-10-23 ENCOUNTER — Other Ambulatory Visit (HOSPITAL_COMMUNITY): Payer: Self-pay | Admitting: Psychiatry

## 2020-10-23 DIAGNOSIS — F5105 Insomnia due to other mental disorder: Secondary | ICD-10-CM

## 2020-10-23 MED ORDER — TADALAFIL 20 MG PO TABS: ORAL_TABLET | ORAL | 3 refills | Status: DC

## 2020-10-23 NOTE — Telephone Encounter (Signed)
tadalafil 20 mg-1 tab 1 hour prior to intercourse

## 2020-11-04 ENCOUNTER — Telehealth (HOSPITAL_COMMUNITY): Payer: BC Managed Care – PPO | Admitting: Psychiatry

## 2020-11-06 DIAGNOSIS — M9901 Segmental and somatic dysfunction of cervical region: Secondary | ICD-10-CM | POA: Diagnosis not present

## 2020-11-06 DIAGNOSIS — M9902 Segmental and somatic dysfunction of thoracic region: Secondary | ICD-10-CM | POA: Diagnosis not present

## 2020-11-06 DIAGNOSIS — M542 Cervicalgia: Secondary | ICD-10-CM | POA: Diagnosis not present

## 2020-11-06 DIAGNOSIS — M7918 Myalgia, other site: Secondary | ICD-10-CM | POA: Diagnosis not present

## 2020-11-18 ENCOUNTER — Other Ambulatory Visit: Payer: Self-pay | Admitting: Family Medicine

## 2020-11-19 ENCOUNTER — Other Ambulatory Visit: Payer: Self-pay | Admitting: Psychiatry

## 2020-11-19 ENCOUNTER — Telehealth (HOSPITAL_COMMUNITY): Payer: BC Managed Care – PPO | Admitting: Psychiatry

## 2020-11-19 DIAGNOSIS — F5105 Insomnia due to other mental disorder: Secondary | ICD-10-CM

## 2020-11-27 ENCOUNTER — Other Ambulatory Visit: Payer: Self-pay

## 2020-11-27 ENCOUNTER — Encounter (HOSPITAL_COMMUNITY): Payer: Self-pay | Admitting: Psychiatry

## 2020-11-27 ENCOUNTER — Telehealth (INDEPENDENT_AMBULATORY_CARE_PROVIDER_SITE_OTHER): Payer: BC Managed Care – PPO | Admitting: Psychiatry

## 2020-11-27 DIAGNOSIS — F39 Unspecified mood [affective] disorder: Secondary | ICD-10-CM

## 2020-11-27 DIAGNOSIS — F5105 Insomnia due to other mental disorder: Secondary | ICD-10-CM | POA: Diagnosis not present

## 2020-11-27 MED ORDER — TRAZODONE HCL 150 MG PO TABS: ORAL_TABLET | ORAL | 0 refills | Status: DC

## 2020-11-27 MED ORDER — LAMOTRIGINE 150 MG PO TABS: 150.0000 mg | ORAL_TABLET | Freq: Two times a day (BID) | ORAL | 0 refills | Status: DC

## 2020-11-27 MED ORDER — MIRTAZAPINE 15 MG PO TABS: 15.0000 mg | ORAL_TABLET | Freq: Every day | ORAL | 0 refills | Status: DC

## 2020-11-27 NOTE — Progress Notes (Signed)
[place] MD OP Progress Note  Virtual Visit via Telephone Note  I connected with Dustin Paul on 11/27/20 at 10:30 AM EDT by telephone and verified that I am speaking with the correct person using two identifiers.  Location: Patient: home Provider: Clinic   I discussed the limitations, risks, security and privacy concerns of performing an evaluation and management service by telephone and the availability of in person appointments. I also discussed with the patient that there may be a patient responsible charge related to this service. The patient expressed understanding and agreed to proceed.   I provided 14 minutes of non-face-to-face time during this encounter.    11/27/2020 10:42 AM Dustin Paul  MRN:  [id number]  Chief Complaint: "  Things are going pretty well."  HPI: Patient reported he is doing pretty well.  He stated that his mood has been stable.  He stated that he takes trazodone 2 tablets of 100 mg strength at night and he sleeps fairly well.  He informed that a few months ago he had a urological procedure done and he was given 3 tablets of trazodone 100 mg strength and that helped him sleep even better. Writer informed him that Probation officer can send a new prescription for 150 mg strength and he can take 2 tablets of that which will equal into 300 mg for sleep at night.  He he liked that suggestion. He has been taking his Lamictal and mirtazapine as prescribed regularly.  He denies any other specific complaints today.  He stated that he has been very busy with his job lately.  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    Visit Diagnosis:    ICD-10-CM   1. Episodic mood disorder (HCC)  F39 mirtazapine (REMERON) 15 MG tablet    lamoTRIgine (LAMICTAL) 150 MG tablet    2. Insomnia due to mental condition  F51.05 traZODone (DESYREL) 150 MG tablet      Past Psychiatric History: Mood d/o, insomnia  Past Medical  History:  Past Medical History:  Diagnosis Date   Aneurysm of artery ([place]) 2011   LEFT (19 MM) AND RIGHT (11 MM) middle cerebral   Chronic kidney disease    H/O STONES   Complication of anesthesia    Headache    MIGRAINES   Insomnia    Lumbar stress fracture    PONV (postoperative nausea and vomiting)    AFTER KNEE SURGERY IN 1980'S   Seasonal allergies    Seizures ([place])    last seizure in 2015   Visual hallucinations     Past Surgical History:  Procedure Laterality Date   BRAIN SURGERY  2011   2 ANEURYSMS-TOTAL OF 9 CLIPS IN BRAIN FROM ANEURYSM   COLONOSCOPY WITH PROPOFOL N/A 01/16/2015   Procedure: COLONOSCOPY WITH PROPOFOL;  Surgeon: Hulen Luster, MD;  Location[facility] ENDOSCOPY;  Service: Gastroenterology;  Laterality: N/A;   ESOPHAGOGASTRODUODENOSCOPY (EGD) WITH PROPOFOL N/A 12/05/2018   Procedure: ESOPHAGOGASTRODUODENOSCOPY (EGD) WITH PROPOFOL;  Surgeon: Toledo, Benay Pike, MD;  Location: [facility] ENDOSCOPY;  Service: Gastroenterology;  Laterality: N/A;   KNEE ARTHROSCOPY WITH MEDIAL MENISECTOMY Left 11/20/2015   Procedure: KNEE ARTHROSCOPY WITH partial MEDIAL MENISECTOMY / WITH REMOVAL OF LOOSE BODY;  Surgeon: Hessie Knows, MD;  Location: [facility] ORS;  Service: Orthopedics;  Laterality: Left;   KNEE SURGERY Left     Family Psychiatric History: denied  Family History: No family history on file.  Social History:  Social History   Socioeconomic History   Marital status: Married    Spouse name: Not on file   Number of children: 2   Years of education: Not on file   Highest education level: Bachelor's degree (e.g., BA, AB, BS)  Occupational History   Occupation: engineer/retired  Tobacco Use   Smoking status: Never   Smokeless tobacco: Never  Vaping Use   Vaping Use: Never used  Substance and Sexual Activity   Alcohol use: No    Alcohol/week: 0.0 standard drinks    Comment: 1-2 drinks a month   Drug use: No   Sexual activity: Not Currently  Other Topics Concern   Not on  file  Social History Narrative   Not on file   Social Determinants of Health   Financial Resource Strain: Not on file  Food Insecurity: Not on file  Transportation Needs: Not on file  Physical Activity: Not on file  Stress: Not on file  Social Connections: Not on file    Allergies:  Allergies  Allergen Reactions   Levetiracetam Other (See Comments)    Racing thoughts per [facility] neurology note 03-11-11 Other reaction(s): Other (See Comments) Racing thoughts   Aspirin     Nose bleed, thin blood    Metabolic Disorder Labs: No results found for: HGBA1C, MPG Lab Results  Component Value Date   PROLACTIN 18.9 (H) 06/25/2015   Lab Results  Component Value Date   CHOL 133 02/19/2020   TRIG 146 02/19/2020   HDL 30 (L) 02/19/2020   CHOLHDL 4.4 02/19/2020   LDLCALC 77 02/19/2020   LDLCALC 81 02/16/2019   Lab Results  Component Value Date   TSH 2.430 06/25/2015   TSH 2.43 06/25/2015    Therapeutic Level Labs: No results found for: LITHIUM No results found for: VALPROATE No components found for:  CBMZ  Current Medications: Current Outpatient Medications  Medication Sig Dispense Refill   Armodafinil 250 MG tablet Take 250 mg by mouth daily.     cyclobenzaprine (FLEXERIL) 10 MG tablet Take 10 mg by mouth 3 (three) times daily as needed. Reported on 05/15/2015     famotidine-calcium carbonate-magnesium hydroxide (PEPCID COMPLETE) 10-800-165 MG chewable tablet Chew 1 tablet by mouth daily as needed.     lamoTRIgine (LAMICTAL) 150 MG tablet Take 1 tablet (150 mg total) by mouth 2 (two) times daily. 180 tablet 0   lisinopril (ZESTRIL) 5 MG tablet TAKE ONE TABLET BY MOUTH EVERY DAY 90 tablet 0   Melatonin 10 MG CAPS Take 1 capsule by mouth at bedtime.     mirtazapine (REMERON) 15 MG tablet Take 1 tablet (15 mg total) by mouth at bedtime. 90 tablet 0   QUDEXY XR 150 MG CS24 take 1 capsule by mouth daily at bedtime  11   RABEprazole (ACIPHEX) 20 MG tablet Take 1 tablet (20 mg  total) by mouth daily. 90 tablet 3   rizatriptan (MAXALT) 5 MG tablet Take 5 mg by mouth as needed for migraine. May repeat in 2 hours if needed     tadalafil (CIALIS) 10 MG tablet 1 tablet 1 hour prior to intercourse 90 tablet 3   tadalafil (CIALIS) 20 MG tablet Take 1 tab 1 hour prior to intercourse 30 tablet 3   traZODone (DESYREL) 150 MG tablet Take 2 tablets at bedtime for sleep 180 tablet 0   No current facility-administered medications for this visit.     Psychiatric Specialty Exam: Review of Systems  There were no vitals taken for  this visit.There is no height or weight on file to calculate BMI.  General Appearance:  Unable to assess due to phone visit  Eye Contact:   Unable to assess due to phone visit  Speech:  Clear and Coherent and Normal Rate  Volume:  Normal  Mood:  Euthymic  Affect:  Congruent  Thought Process:  Goal Directed, Linear and Descriptions of Associations: Intact  Orientation:  Full (Time, Place, and Person)  Thought Content: Logical   Suicidal Thoughts:  No  Homicidal Thoughts:  No  Memory:  Recent;   Good Remote;   Good  Judgement:  Fair  Insight:  Fair  Psychomotor Activity:  Normal  Concentration:  Concentration: Good and Attention Span: Good  Recall:  Good  Fund of Knowledge: Good  Language: Good  Akathisia:  Negative  Handed:  Right  AIMS (if indicated): not done  Assets:  Communication Skills Desire for Improvement Financial Resources/Insurance Housing Social Support  ADL's:  Intact  Cognition: WNL  Sleep:  Good   Screenings: [place] Office Visit from 07/12/2016 in [place] Total Score 0      PHQ2-9    Flowsheet Row Video Visit from 11/27/2020 in [facility] Office Visit from 02/15/2020 in [place] Visit from 02/13/2019 in [place] from 08/16/2017 in [place] Visit from  08/10/2016 in [place]  PHQ-2 Total Score 0 0 0 0 0  PHQ-9 Total Score -- -- 1 -- 2      Flowsheet Row Video Visit from 11/27/2020 in [place] No Risk        Assessment and Plan: Patient is stable on his regimen.  1. Episodic mood disorder (HCC)  - mirtazapine (REMERON) 15 MG tablet; Take 1 tablet (15 mg total) by mouth at bedtime.  Dispense: 90 tablet; Refill: 0 - lamoTRIgine (LAMICTAL) 150 MG tablet; Take 1 tablet (150 mg total) by mouth 2 (two) times daily.  Dispense: 180 tablet; Refill: 0  2. Insomnia due to mental condition  -Adjust traZODone (DESYREL) 150 MG tablet; Take 2 tablets at bedtime for sleep  Dispense: 180 tablet; Refill: 0   Follow up in 3 months.  Patient was informed that his care is being transferred to a different provider at [facility] psychiatry clinic.  He verbalized his understanding.  [place] Crane, MD 11/27/2020, 10:42 AM

## 2020-12-03 ENCOUNTER — Other Ambulatory Visit: Payer: Self-pay | Admitting: Psychiatry

## 2020-12-03 ENCOUNTER — Telehealth: Payer: Self-pay | Admitting: *Deleted

## 2020-12-03 DIAGNOSIS — F5105 Insomnia due to other mental disorder: Secondary | ICD-10-CM

## 2020-12-03 MED ORDER — TRAZODONE HCL 100 MG PO TABS: 200.0000 mg | ORAL_TABLET | Freq: Every day | ORAL | 0 refills | Status: DC

## 2020-12-03 NOTE — Telephone Encounter (Signed)
Patient called stating he would like to see if provider could please change the strength of his Trazodone due to not being able to break it. Per pt he is taking 200 mg and each tablet is 100 mg. Per pt he was instructed that if he needed to he could take 300 mg. Per pt most of the time he normally take 200 mg at night.

## 2020-12-03 NOTE — Telephone Encounter (Signed)
He is not under my care, but did order trazodone 200 mg at night as a courtesy. Could you contact front desk and have follow up/transfer from Dr. Toy Care to me. I will not be able to order any more meds without evaluation. Thanks.
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
## 2021-01-06 ENCOUNTER — Other Ambulatory Visit: Payer: Self-pay

## 2021-01-06 ENCOUNTER — Telehealth (INDEPENDENT_AMBULATORY_CARE_PROVIDER_SITE_OTHER): Payer: BC Managed Care – PPO | Admitting: Psychiatry

## 2021-01-06 ENCOUNTER — Encounter: Payer: Self-pay | Admitting: Psychiatry

## 2021-01-06 DIAGNOSIS — G4700 Insomnia, unspecified: Secondary | ICD-10-CM | POA: Diagnosis not present

## 2021-01-06 DIAGNOSIS — F3342 Major depressive disorder, recurrent, in full remission: Secondary | ICD-10-CM | POA: Diagnosis not present

## 2021-01-06 NOTE — Patient Instructions (Signed)
Decrease mirtazepine 7.5 mg at night  Continue Trazodone 200 mg at night as needed for insomnia Next appointment- 10/5 at 10 AM

## 2021-01-15 ENCOUNTER — Other Ambulatory Visit: Payer: Self-pay | Admitting: Family Medicine

## 2021-01-15 ENCOUNTER — Other Ambulatory Visit: Payer: Self-pay | Admitting: Urology

## 2021-01-15 NOTE — Telephone Encounter (Signed)
Requested medication (s) are due for refill today: yes  Requested medication (s) are on the active medication list: yes  Last refill:  12/17/2020  Future visit scheduled: yes   Notes to clinic:  Failed protocol:  Cr in normal range and within 180 days   K in normal range and within 180 days   Last BP in normal range   Valid encounter within last 6 months     Requested Prescriptions  Pending Prescriptions Disp Refills   lisinopril (ZESTRIL) 5 MG tablet [Pharmacy Med Name: LISINOPRIL 5 MG TAB] 90 tablet 0    Sig: TAKE ONE TABLET (5 MG) BY MOUTH EVERY DAY     Cardiovascular:  ACE Inhibitors Failed - 01/15/2021 12:39 PM      Failed - Cr in normal range and within 180 days    Creatinine, Ser  Date Value Ref Range Status  02/19/2020 1.44 (H) 0.76 - 1.27 mg/dL Final          Failed - K in normal range and within 180 days    Potassium  Date Value Ref Range Status  02/19/2020 4.7 3.5 - 5.2 mmol/L Final          Failed - Last BP in normal range    BP Readings from Last 1 Encounters:  06/18/20 (!) 168/92          Failed - Valid encounter within last 6 months    Recent Outpatient Visits           11 months ago Annual physical exam   [facility] Birdie Sons, MD   1 year ago Essential (primary) hypertension   [place], Kirstie Peri, MD   1 year ago Upper respiratory infection, acute   [place], Kelby Aline, FNP   1 year ago Annual physical exam   [facility] Birdie Sons, MD   2 years ago Cough   [facility] Birdie Sons, MD       Future Appointments             In 5 months Stoioff, Ronda Fairly, MD [place] - Patient is not pregnant

## 2021-02-11 ENCOUNTER — Other Ambulatory Visit: Payer: Self-pay | Admitting: Urology

## 2021-02-12 ENCOUNTER — Telehealth: Payer: Self-pay

## 2021-02-12 NOTE — Telephone Encounter (Signed)
Copied from [place] [id number]. Topic: Appointment Scheduling - Scheduling Inquiry for Clinic >> Feb 12, 2021  3:47 PM Greggory Keen D wrote: Reason for CRM: Pt called trying to resch his appt.  It would not let me schd a phyical.  It would only schd a MWV.  Pt does not have medicare.  He only has bcbs.  Please call patent for correct ins info.  He said he was going to try send it through my chart.  CB#[phone]

## 2021-02-19 ENCOUNTER — Encounter: Payer: BC Managed Care – PPO | Admitting: Family Medicine

## 2021-02-25 DIAGNOSIS — M9902 Segmental and somatic dysfunction of thoracic region: Secondary | ICD-10-CM | POA: Diagnosis not present

## 2021-02-25 DIAGNOSIS — M7918 Myalgia, other site: Secondary | ICD-10-CM | POA: Diagnosis not present

## 2021-02-25 DIAGNOSIS — M542 Cervicalgia: Secondary | ICD-10-CM | POA: Diagnosis not present

## 2021-02-25 DIAGNOSIS — M9901 Segmental and somatic dysfunction of cervical region: Secondary | ICD-10-CM | POA: Diagnosis not present

## 2021-03-04 ENCOUNTER — Telehealth: Payer: BC Managed Care – PPO | Admitting: Psychiatry

## 2021-03-24 ENCOUNTER — Other Ambulatory Visit: Payer: Self-pay

## 2021-03-24 ENCOUNTER — Encounter: Payer: Self-pay | Admitting: Family Medicine

## 2021-03-24 ENCOUNTER — Ambulatory Visit (INDEPENDENT_AMBULATORY_CARE_PROVIDER_SITE_OTHER): Payer: BC Managed Care – PPO | Admitting: Family Medicine

## 2021-03-24 VITALS — BP 134/89 | HR 87 | Temp 98.0°F | Resp 18 | Ht 72.0 in | Wt 218.0 lb

## 2021-03-24 DIAGNOSIS — K219 Gastro-esophageal reflux disease without esophagitis: Secondary | ICD-10-CM

## 2021-03-24 DIAGNOSIS — I10 Essential (primary) hypertension: Secondary | ICD-10-CM

## 2021-03-24 DIAGNOSIS — Z0000 Encounter for general adult medical examination without abnormal findings: Secondary | ICD-10-CM

## 2021-03-24 DIAGNOSIS — Z86010 Personal history of colonic polyps: Secondary | ICD-10-CM

## 2021-03-24 DIAGNOSIS — F39 Unspecified mood [affective] disorder: Secondary | ICD-10-CM

## 2021-03-24 NOTE — Patient Instructions (Addendum)
Please review the attached list of medications and notify my office if there are any errors.   We'll put in order for your labs the first of November. Please go to the lab draw station in [address] on the second floor of [facility]  when you are fasting for 8 hours. Normal hours are 8:00am to 11:30am and 1:00pm to 4:00pm Monday through Friday   We'll put referral to [facility] for your follow up colonoscopy in January 2023  I strongly recommend a Covid bivalent (omicron) booster if it's been more than 2 months since your last covid booster or infection.    I recommend that you get a flu vaccine this year. Please call our office at [phone] at your earliest convenience to schedule a flu shot.
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
## 2021-03-25 NOTE — Progress Notes (Deleted)
BH MD/PA/NP OP Progress Note  03/25/2021 3:16 PM Dustin Paul  MRN:  [id number]  Chief Complaint:  HPI: *** Visit Diagnosis: No diagnosis found.  Past Psychiatric History: Please see initial evaluation for full details. I have reviewed the history. No updates at this time.     Past Medical History:  Past Medical History:  Diagnosis Date   Aneurysm of artery ([place]) 2011   LEFT (19 MM) AND RIGHT (11 MM) middle cerebral   Chronic kidney disease    H/O STONES   Complication of anesthesia    Headache    MIGRAINES   Insomnia    Lumbar stress fracture    PONV (postoperative nausea and vomiting)    AFTER KNEE SURGERY IN 1980'S   Seasonal allergies    Seizures ([place])    last seizure in 2015   Visual hallucinations     Past Surgical History:  Procedure Laterality Date   BRAIN SURGERY  2011   2 ANEURYSMS-TOTAL OF 9 CLIPS IN BRAIN FROM ANEURYSM   COLONOSCOPY WITH PROPOFOL N/A 01/16/2015   Procedure: COLONOSCOPY WITH PROPOFOL;  Surgeon: Hulen Luster, MD;  Location: [facility] ENDOSCOPY;  Service: Gastroenterology;  Laterality: N/A;   ESOPHAGOGASTRODUODENOSCOPY (EGD) WITH PROPOFOL N/A 12/05/2018   Procedure: ESOPHAGOGASTRODUODENOSCOPY (EGD) WITH PROPOFOL;  Surgeon: Toledo, Benay Pike, MD;  Location: [facility] ENDOSCOPY;  Service: Gastroenterology;  Laterality: N/A;   KNEE ARTHROSCOPY WITH MEDIAL MENISECTOMY Left 11/20/2015   Procedure: KNEE ARTHROSCOPY WITH partial MEDIAL MENISECTOMY / WITH REMOVAL OF LOOSE BODY;  Surgeon: Hessie Knows, MD;  Location: [facility] ORS;  Service: Orthopedics;  Laterality: Left;   KNEE SURGERY Left     Family Psychiatric History: Please see initial evaluation for full details. I have reviewed the history. No updates at this time.     Family History:  Family History  Problem Relation Age of Onset   Depression Maternal Aunt     Social History:  Social History   Socioeconomic History   Marital status: Married    Spouse name: Not on file   Number of children: 2    Years of education: Not on file   Highest education level: Bachelor's degree (e.g., BA, AB, BS)  Occupational History   Occupation: engineer/retired  Tobacco Use   Smoking status: Never   Smokeless tobacco: Never  Vaping Use   Vaping Use: Never used  Substance and Sexual Activity   Alcohol use: No    Alcohol/week: 0.0 standard drinks    Comment: 1-2 drinks a month   Drug use: No   Sexual activity: Not Currently  Other Topics Concern   Not on file  Social History Narrative   Not on file   Social Determinants of Health   Financial Resource Strain: Not on file  Food Insecurity: Not on file  Transportation Needs: Not on file  Physical Activity: Not on file  Stress: Not on file  Social Connections: Not on file    Allergies:  Allergies  Allergen Reactions   Levetiracetam Other (See Comments)    Racing thoughts per [facility] neurology note 03-11-11 Other reaction(s): Other (See Comments) Racing thoughts   Aspirin     Nose bleed, thin blood    Metabolic Disorder Labs: No results found for: HGBA1C, MPG Lab Results  Component Value Date   PROLACTIN 18.9 (H) 06/25/2015   Lab Results  Component Value Date   CHOL 133 02/19/2020   TRIG 146 02/19/2020   HDL 30 (L) 02/19/2020   CHOLHDL 4.4 02/19/2020  LDLCALC 77 02/19/2020   LDLCALC 81 02/16/2019   Lab Results  Component Value Date   TSH 2.430 06/25/2015   TSH 2.43 06/25/2015    Therapeutic Level Labs: No results found for: LITHIUM No results found for: VALPROATE No components found for:  CBMZ  Current Medications: Current Outpatient Medications  Medication Sig Dispense Refill   Armodafinil 250 MG tablet Take 250 mg by mouth daily.     cyclobenzaprine (FLEXERIL) 10 MG tablet Take 10 mg by mouth 3 (three) times daily as needed. Reported on 05/15/2015     famotidine-calcium carbonate-magnesium hydroxide (PEPCID COMPLETE) 10-800-165 MG chewable tablet Chew 1 tablet by mouth daily as needed.     lamoTRIgine (LAMICTAL)  150 MG tablet Take 1 tablet (150 mg total) by mouth 2 (two) times daily. 180 tablet 0   lisinopril (ZESTRIL) 5 MG tablet TAKE ONE TABLET (5 MG) BY MOUTH EVERY DAY 90 tablet 0   Melatonin 10 MG CAPS Take 1 capsule by mouth at bedtime.     mirtazapine (REMERON) 15 MG tablet Take 1 tablet (15 mg total) by mouth at bedtime. 90 tablet 0   QUDEXY XR 150 MG CS24 take 1 capsule by mouth daily at bedtime  11   RABEprazole (ACIPHEX) 20 MG tablet Take 1 tablet (20 mg total) by mouth daily. 90 tablet 3   tadalafil (CIALIS) 10 MG tablet 1 tablet 1 hour prior to intercourse 90 tablet 3   tadalafil (CIALIS) 20 MG tablet TAKE ONE TABLET BY MOUTH 1 HOUR PRIOR TOINTERCOURSE 30 tablet 3   traZODone (DESYREL) 100 MG tablet Take 2 tablets (200 mg total) by mouth at bedtime. Take 2 tablets at bedtime for sleep 180 tablet 0   No current facility-administered medications for this visit.     Musculoskeletal: Strength & Muscle Tone:  N/A Gait & Station:  N/A Patient leans: N/A  Psychiatric Specialty Exam: Review of Systems  There were no vitals taken for this visit.There is no height or weight on file to calculate BMI.  General Appearance: {Appearance:22683}  Eye Contact:  {BHH EYE CONTACT:22684}  Speech:  Clear and Coherent  Volume:  Normal  Mood:  {BHH MOOD:22306}  Affect:  {Affect (PAA):22687}  Thought Process:  Coherent  Orientation:  Full (Time, Place, and Person)  Thought Content: Logical   Suicidal Thoughts:  {ST/HT (PAA):22692}  Homicidal Thoughts:  {ST/HT (PAA):22692}  Memory:  Immediate;   Good  Judgement:  {Judgement (PAA):22694}  Insight:  {Insight (PAA):22695}  Psychomotor Activity:  Normal  Concentration:  Concentration: Good and Attention Span: Good  Recall:  Good  Fund of Knowledge: Good  Language: Good  Akathisia:  No  Handed:  Right  AIMS (if indicated): not done  Assets:  Communication Skills Desire for Improvement  ADL's:  Intact  Cognition: WNL  Sleep:  {BHH  GOOD/FAIR/POOR:22877}   Screenings: AIMS    Flowsheet Row Office Visit from 07/12/2016 in [place] Total Score 0      PHQ2-9    [place] Visit from 03/24/2021 in [place] Video Visit from 01/06/2021 in [place] Video Visit from 11/27/2020 in [facility] Office Visit from 02/15/2020 in [place] Visit from 02/13/2019 in [place]  PHQ-2 Total Score 0 0 0 0 0  PHQ-9 Total Score 1 -- -- -- 1      Flowsheet Row Video Visit from 01/06/2021 in [place] Video Visit from  11/27/2020 in [place] CATEGORY No Risk No Risk        Assessment and Plan:  Dustin Paul is a 60 y.o. year old male with a history of episodic mood disorder, insomnia, CKD, migraine, cerebral aneurysm, hx of bilateral MCA aneurysms s/p clipping on the left, who presents for follow up appointment for below.     1. Recurrent major depressive disorder, in full remission [facility]) He denies any significant mood symptoms since the last year.  He did have recurrence of depressive symptoms in the context of brain aneurysm and most recently in the context of conflict with her oldest daughter.  He reports great support from his wife and his youngest daughter, and is well engaged with his work despite stress at his current work environment.  He is willing to taper down mirtazapine.  Discussed risk of drowsiness and metabolic side effect.  Discussed potential risk of relapse in his mood symptoms in the context of tapering down this medication.  He is advised to contact the office if any worsening in his symptoms.  Noted that although lamotrigine was prescribed by Dr. Vic Ripper in the past, he states that it is indicated for seizure, and he believes it has been prescribed by his neurologist.    2.  Insomnia, unspecified type He reports good benefit from trazodone.  Will continue current dose to target insomnia.    Plan Decrease mirtazapine 7.5 mg at night  Continue Trazodone 200 mg at night as needed for insomnia Next appointment- 10/5 at 10 AM for 30 mins, video - on lamotrigine 150 mg twice a day for seizure. He states that this medication is prescribed by his neurologist - on flexeril, armodafinil prn     The patient demonstrates the following risk factors for suicide: Chronic risk factors for suicide include: psychiatric disorder of depression . Acute risk factors for suicide include: N/A. Protective factors for this patient include: positive social support, coping skills, and hope for the future. Considering these factors, the overall suicide risk at this point appears to be low. Patient is appropriate for outpatient follow up.     [name], MD 03/25/2021, 3:16 PM

## 2021-03-27 ENCOUNTER — Telehealth: Payer: Self-pay | Admitting: Psychiatry

## 2021-03-27 ENCOUNTER — Other Ambulatory Visit: Payer: Self-pay

## 2021-03-27 ENCOUNTER — Encounter: Payer: Self-pay | Admitting: Psychiatry

## 2021-03-27 NOTE — Telephone Encounter (Signed)
This encounter was created in error - please disregard.

## 2021-03-27 NOTE — Telephone Encounter (Signed)
Sent link for video visit through [place]. Patient did not sign in. Called the patient twice for appointme scheduled today. The patient did not answer the phone. Left voice message to contact the office [phone]).

## 2021-04-01 ENCOUNTER — Encounter: Payer: Self-pay | Admitting: Family Medicine

## 2021-04-01 ENCOUNTER — Other Ambulatory Visit: Payer: Self-pay | Admitting: Family Medicine

## 2021-04-01 DIAGNOSIS — Z125 Encounter for screening for malignant neoplasm of prostate: Secondary | ICD-10-CM

## 2021-04-01 DIAGNOSIS — I10 Essential (primary) hypertension: Secondary | ICD-10-CM

## 2021-04-20 ENCOUNTER — Telehealth: Payer: Self-pay | Admitting: Psychiatry

## 2021-04-20 ENCOUNTER — Other Ambulatory Visit: Payer: Self-pay | Admitting: Psychiatry

## 2021-04-20 DIAGNOSIS — F5105 Insomnia due to other mental disorder: Secondary | ICD-10-CM

## 2021-04-20 NOTE — Telephone Encounter (Signed)
Ordered refill of Trazodone per request. Please contact the patient to make follow up appointment for 30 mins. I will not be able to prescribe any more refills without evaluation.

## 2021-04-21 NOTE — Telephone Encounter (Signed)
called pt, he has a appt set up for the 30th.

## 2021-04-27 NOTE — Progress Notes (Signed)
Virtual Visit via Video Note  I connected with Dustin Paul on 04/29/21 at  1:20 PM EST by a video enabled telemedicine application and verified that I am speaking with the correct person using two identifiers.  Location: Patient: home Provider: office Persons participated in the visit- patient, provider    I discussed the limitations of evaluation and management by telemedicine and the availability of in person appointments. The patient expressed understanding and agreed to proceed.    I discussed the assessment and treatment plan with the patient. The patient was provided an opportunity to ask questions and all were answered. The patient agreed with the plan and demonstrated an understanding of the instructions.   The patient was advised to call back or seek an in-person evaluation if the symptoms worsen or if the condition fails to improve as anticipated.  I provided 15 minutes of non-face-to-face time during this encounter.   [name], MD    [facility] MD/PA/NP OP Progress Note  04/29/2021 2:03 PM Dustin Paul  Chief Complaint:  Chief Complaint   Follow-up; Depression; Insomnia    HPI:  This is a follow-up appointment for depression and insomnia.  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  He likes a lot about his work and his coworkers except his Librarian, academic, who tends to be disorganized.  He talks about an frustration of some dysfunctionality at work.  Although this makes him feel anxious, he has been doing well otherwise.  He had a good Thanksgiving with his wife's family.  He was able to feel relaxed.  He enjoys taking a walk or watching movies.  He sleeps well as long as he takes trazodone.  Although he tried taking a lower dose, he had a middle insomnia.  He denies feeling depressed or anhedonia.  He has good concentration.  He denies change in appetite.  He denies SI.  He denies panic attacks or irritability.  He denies  hallucinations or paranoia.  He drinks 2 shots every other day.  He denies craving for alcohol.  He denies drug use.  He states that he was diagnosed with depression since around 08-27-09 when he had a brain surgery.  The most recent episode of feeling depressed was around in summer in the context of stress at work.  He thinks he has been doing well otherwise, and would like to come off from mirtazapine.    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  Support: wife, youngest daughter Household: wife Marital status: married since August 27, 2016, his former wife died in 2011/08/28 from breast cancer,  Number of children: 2. (in [place], [place]), he reports a strange relationship with his 60 yo oldest daughter   Visit Diagnosis:    ICD-10-CM   1. Recurrent major depressive disorder, in full remission ([place])  F33.42     2. Insomnia, unspecified type  G47.00     3. Insomnia due to mental condition  F51.05 traZODone (DESYREL) 100 MG tablet      Past Psychiatric History: Please see initial evaluation for full details. I have reviewed the history. No updates at this time.     Past Medical History:  Past Medical History:  Diagnosis Date   Aneurysm of artery ([place]) 2009/08/27   LEFT (19 MM) AND RIGHT (11 MM) middle cerebral   Chronic kidney disease    H/O STONES   Complication of anesthesia    Headache  MIGRAINES   Insomnia    Lumbar stress fracture    PONV (postoperative nausea and vomiting)    AFTER KNEE SURGERY IN 1980'S   Seasonal allergies    Seizures (HCC)    last seizure in 2015   Visual hallucinations     Past Surgical History:  Procedure Laterality Date   BRAIN SURGERY  2011   2 ANEURYSMS-TOTAL OF 9 CLIPS IN BRAIN FROM ANEURYSM   COLONOSCOPY WITH PROPOFOL N/A 01/16/2015   Procedure: COLONOSCOPY WITH PROPOFOL;  Surgeon: Hulen Luster, MD;  Location: [facility] ENDOSCOPY;  Service: Gastroenterology;  Laterality: N/A;   ESOPHAGOGASTRODUODENOSCOPY (EGD) WITH  PROPOFOL N/A 12/05/2018   Procedure: ESOPHAGOGASTRODUODENOSCOPY (EGD) WITH PROPOFOL;  Surgeon: Toledo, Benay Pike, MD;  Location: [facility] ENDOSCOPY;  Service: Gastroenterology;  Laterality: N/A;   KNEE ARTHROSCOPY WITH MEDIAL MENISECTOMY Left 11/20/2015   Procedure: KNEE ARTHROSCOPY WITH partial MEDIAL MENISECTOMY / WITH REMOVAL OF LOOSE BODY;  Surgeon: Hessie Knows, MD;  Location: [facility] ORS;  Service: Orthopedics;  Laterality: Left;   KNEE SURGERY Left     Family Psychiatric History: Please see initial evaluation for full details. I have reviewed the history. No updates at this time.     Family History:  Family History  Problem Relation Age of Onset   Depression Maternal Aunt     Social History:  Social History   Socioeconomic History   Marital status: Married    Spouse name: Not on file   Number of children: 2   Years of education: Not on file   Highest education level: Bachelor's degree (e.g., BA, AB, BS)  Occupational History   Occupation: engineer/retired  Tobacco Use   Smoking status: Never   Smokeless tobacco: Never  Vaping Use   Vaping Use: Never used  Substance and Sexual Activity   Alcohol use: No    Alcohol/week: 0.0 standard drinks    Comment: 1-2 drinks a month   Drug use: No   Sexual activity: Not Currently  Other Topics Concern   Not on file  Social History Narrative   Not on file   Social Determinants of Health   Financial Resource Strain: Not on file  Food Insecurity: Not on file  Transportation Needs: Not on file  Physical Activity: Not on file  Stress: Not on file  Social Connections: Not on file    Allergies:  Allergies  Allergen Reactions   Levetiracetam Other (See Comments)    Racing thoughts per [facility] neurology note 03-11-11 Other reaction(s): Other (See Comments) Racing thoughts   Aspirin     Nose bleed, thin blood    Metabolic Disorder Labs: No results found for: HGBA1C, MPG Lab Results  Component Value Date   PROLACTIN 18.9 (H)  06/25/2015   Lab Results  Component Value Date   CHOL 133 02/19/2020   TRIG 146 02/19/2020   HDL 30 (L) 02/19/2020   CHOLHDL 4.4 02/19/2020   LDLCALC 77 02/19/2020   LDLCALC 81 02/16/2019   Lab Results  Component Value Date   TSH 2.430 06/25/2015   TSH 2.43 06/25/2015    Therapeutic Level Labs: No results found for: LITHIUM No results found for: VALPROATE No components found for:  CBMZ  Current Medications: Current Outpatient Medications  Medication Sig Dispense Refill   Armodafinil 250 MG tablet Take 250 mg by mouth daily.     cyclobenzaprine (FLEXERIL) 10 MG tablet Take 10 mg by mouth 3 (three) times daily as needed. Reported on 05/15/2015     famotidine-calcium carbonate-magnesium hydroxide (  PEPCID COMPLETE) [phone] MG chewable tablet Chew 1 tablet by mouth daily as needed.     lamoTRIgine (LAMICTAL) 150 MG tablet Take 1 tablet (150 mg total) by mouth 2 (two) times daily. 180 tablet 0   lisinopril (ZESTRIL) 5 MG tablet TAKE ONE TABLET (5 MG) BY MOUTH EVERY DAY 90 tablet 0   Melatonin 10 MG CAPS Take 1 capsule by mouth at bedtime.     QUDEXY XR 150 MG CS24 take 1 capsule by mouth daily at bedtime  11   RABEprazole (ACIPHEX) 20 MG tablet Take 1 tablet (20 mg total) by mouth daily. 90 tablet 3   tadalafil (CIALIS) 10 MG tablet 1 tablet 1 hour prior to intercourse 90 tablet 3   tadalafil (CIALIS) 20 MG tablet TAKE ONE TABLET BY MOUTH 1 HOUR PRIOR TOINTERCOURSE 30 tablet 3   [START ON 07/20/2021] traZODone (DESYREL) 100 MG tablet Take 2 tablets (200 mg total) by mouth at bedtime as needed for sleep. 180 tablet 0   No current facility-administered medications for this visit.     Musculoskeletal: Strength & Muscle Tone:  N/A Gait & Station:  N/A Patient leans: N/A  Psychiatric Specialty Exam: Review of Systems  Psychiatric/Behavioral:  Negative for agitation, behavioral problems, confusion, decreased concentration, dysphoric mood, hallucinations, self-injury, sleep  disturbance and suicidal ideas. The patient is nervous/anxious. The patient is not hyperactive.   All other systems reviewed and are negative.  There were no vitals taken for this visit.There is no height or weight on file to calculate BMI.  General Appearance: Fairly Groomed  Eye Contact:  Good  Speech:  Clear and Coherent  Volume:  Normal  Mood:   good  Affect:  Appropriate, Congruent, and euthymic  Thought Process:  Coherent  Orientation:  Full (Time, Place, and Person)  Thought Content: Logical   Suicidal Thoughts:  No  Homicidal Thoughts:  No  Memory:  Immediate;   Good  Judgement:  Good  Insight:  Good  Psychomotor Activity:  Normal  Concentration:  Concentration: Good and Attention Span: Good  Recall:  Good  Fund of Knowledge: Good  Language: Good  Akathisia:  No  Handed:  Right  AIMS (if indicated): not done  Assets:  Communication Skills Desire for Improvement  ADL's:  Intact  Cognition: WNL  Sleep:  Good   Screenings: [place] Office Visit from 07/12/2016 in [place] Total Score 0      PHQ2-9    [place] Visit from 03/24/2021 in [place] Video Visit from 01/06/2021 in [place] Video Visit from 11/27/2020 in [facility] Office Visit from 02/15/2020 in [place] Visit from 02/13/2019 in [place]  PHQ-2 Total Score 0 0 0 0 0  PHQ-9 Total Score 1 -- -- -- 1      Flowsheet Row Video Visit from 01/06/2021 in [place] Video Visit from 11/27/2020 in [place] No Risk No Risk        Assessment and Plan:  Dustin Paul is a 60 y.o. year old male with a history of episodic mood disorder, insomnia, CKD, migraine, cerebral aneurysm, hx of bilateral MCA aneurysms s/p clipping on the left, who presents for  follow up appointment for below.    1. Recurrent major depressive disorder, in full remission ([place]) He denies any mood symptoms except self-limited anxiety in  the context of stress at work since the last visit.  He has episodes of depression since brain surgery 10 years ago, and most recently during summer in the context of stress at previous work.  He likes his new job, and enjoys connection with his family.  He is willing to discontinue mirtazapine; discussed potential risk of relapse in depressive symptoms.  He is advised to contact the office if any worsening in his symptoms.   2. Insomnia, unspecified type He reports good benefit from trazodone.  Will continue current dose to target insomnia.     Plan Discontinue mirtazapine Continue Trazodone 200 mg at night as needed for insomnia Next appointment- 10/5 at 10 AM for 30 mins, video - on lamotrigine 150 mg twice a day for seizure. He states that this medication is prescribed by his neurologist - on flexeril, armodafinil prn     The patient demonstrates the following risk factors for suicide: Chronic risk factors for suicide include: psychiatric disorder of depression . Acute risk factors for suicide include: N/A. Protective factors for this patient include: positive social support, coping skills, and hope for the future. Considering these factors, the overall suicide risk at this point appears to be low. Patient is appropriate for outpatient follow up.   [name], MD 04/29/2021, 2:03 PM

## 2021-04-29 ENCOUNTER — Other Ambulatory Visit: Payer: Self-pay

## 2021-04-29 ENCOUNTER — Encounter: Payer: Self-pay | Admitting: Psychiatry

## 2021-04-29 ENCOUNTER — Telehealth (INDEPENDENT_AMBULATORY_CARE_PROVIDER_SITE_OTHER): Payer: BC Managed Care – PPO | Admitting: Psychiatry

## 2021-04-29 DIAGNOSIS — G4700 Insomnia, unspecified: Secondary | ICD-10-CM | POA: Diagnosis not present

## 2021-04-29 DIAGNOSIS — F5105 Insomnia due to other mental disorder: Secondary | ICD-10-CM

## 2021-04-29 DIAGNOSIS — F3342 Major depressive disorder, recurrent, in full remission: Secondary | ICD-10-CM | POA: Diagnosis not present

## 2021-04-29 MED ORDER — TRAZODONE HCL 100 MG PO TABS: 200.0000 mg | ORAL_TABLET | Freq: Every evening | ORAL | 0 refills | Status: DC | PRN

## 2021-04-29 NOTE — Patient Instructions (Signed)
Discontinue mirtazapine Continue Trazodone 200 mg at night as needed for insomnia Next appointment- 10/5 at 10 AM

## 2021-05-06 ENCOUNTER — Other Ambulatory Visit: Payer: Self-pay | Admitting: Family Medicine

## 2021-05-28 ENCOUNTER — Encounter: Payer: Self-pay | Admitting: Family Medicine

## 2021-05-28 ENCOUNTER — Other Ambulatory Visit: Payer: Self-pay | Admitting: Family Medicine

## 2021-05-28 ENCOUNTER — Telehealth: Payer: Self-pay

## 2021-05-28 DIAGNOSIS — N200 Calculus of kidney: Secondary | ICD-10-CM

## 2021-05-28 NOTE — Telephone Encounter (Signed)
Patient advised that lab orders were placed on 04/01/2021 by Dr. Caryn Section. Patient plans to have fasting labs done next week.

## 2021-05-28 NOTE — Telephone Encounter (Signed)
Copied from [facility] [id number]. Topic: General - Other >> May 27, 2021  4:18 PM Yvette Rack wrote: Reason for CRM: Pt stated he would like to pick up the lab order on 06/02/21 so he can go to have labs drawn. Pt also requests call back to advise which vaccines Dr. Caryn Section advised him that are needed. Cb# [phone]

## 2021-06-03 ENCOUNTER — Other Ambulatory Visit: Payer: Self-pay | Admitting: Family Medicine

## 2021-06-03 DIAGNOSIS — Z86010 Personal history of colonic polyps: Secondary | ICD-10-CM

## 2021-06-05 ENCOUNTER — Other Ambulatory Visit (HOSPITAL_COMMUNITY): Payer: Self-pay | Admitting: Psychiatry

## 2021-06-05 DIAGNOSIS — F39 Unspecified mood [affective] disorder: Secondary | ICD-10-CM

## 2021-06-05 DIAGNOSIS — Z125 Encounter for screening for malignant neoplasm of prostate: Secondary | ICD-10-CM | POA: Diagnosis not present

## 2021-06-05 DIAGNOSIS — I10 Essential (primary) hypertension: Secondary | ICD-10-CM | POA: Diagnosis not present

## 2021-06-06 LAB — CBC
Hematocrit: 43.3 % (ref 37.5–51.0)
Hemoglobin: 15 g/dL (ref 13.0–17.7)
MCH: 30.1 pg (ref 26.6–33.0)
MCHC: 34.6 g/dL (ref 31.5–35.7)
MCV: 87 fL (ref 79–97)
Platelets: 180 10*3/uL (ref 150–450)
RBC: 4.98 x10E6/uL (ref 4.14–5.80)
RDW: 12.7 % (ref 11.6–15.4)
WBC: 4.9 10*3/uL (ref 3.4–10.8)

## 2021-06-06 LAB — COMPREHENSIVE METABOLIC PANEL
ALT: 20 IU/L (ref 0–44)
AST: 23 IU/L (ref 0–40)
Albumin/Globulin Ratio: 1.8 (ref 1.2–2.2)
Albumin: 4.4 g/dL (ref 3.8–4.9)
Alkaline Phosphatase: 48 IU/L (ref 44–121)
BUN/Creatinine Ratio: 10 (ref 10–24)
BUN: 14 mg/dL (ref 8–27)
Bilirubin Total: 0.3 mg/dL (ref 0.0–1.2)
CO2: 24 mmol/L (ref 20–29)
Calcium: 9.2 mg/dL (ref 8.6–10.2)
Chloride: 101 mmol/L (ref 96–106)
Creatinine, Ser: 1.39 mg/dL — ABNORMAL HIGH (ref 0.76–1.27)
Globulin, Total: 2.4 g/dL (ref 1.5–4.5)
Glucose: 101 mg/dL — ABNORMAL HIGH (ref 70–99)
Potassium: 4.5 mmol/L (ref 3.5–5.2)
Sodium: 140 mmol/L (ref 134–144)
Total Protein: 6.8 g/dL (ref 6.0–8.5)
eGFR: 58 mL/min/{1.73_m2} — ABNORMAL LOW (ref >59)

## 2021-06-06 LAB — LIPID PANEL
Chol/HDL Ratio: 3.9 ratio (ref 0.0–5.0)
Cholesterol, Total: 136 mg/dL (ref 100–199)
HDL: 35 mg/dL — ABNORMAL LOW (ref >39)
LDL Chol Calc (NIH): 80 mg/dL (ref 0–99)
Triglycerides: 113 mg/dL (ref 0–149)
VLDL Cholesterol Cal: 21 mg/dL (ref 5–40)

## 2021-06-06 LAB — PSA: Prostate Specific Ag, Serum: 2.8 ng/mL (ref 0.0–4.0)

## 2021-06-08 DIAGNOSIS — M25562 Pain in left knee: Secondary | ICD-10-CM | POA: Diagnosis not present

## 2021-06-08 DIAGNOSIS — M1712 Unilateral primary osteoarthritis, left knee: Secondary | ICD-10-CM | POA: Diagnosis not present

## 2021-06-12 ENCOUNTER — Telehealth: Payer: Self-pay | Admitting: Urology

## 2021-06-12 NOTE — Telephone Encounter (Signed)
Talked to patient about going to [address] .That is fine to go there. Advised the patient it should be the same price because it all cone

## 2021-06-12 NOTE — Telephone Encounter (Signed)
Pt would like to have his x-rays done at outpatient imaging on [address]. instead of here at the hospital if possible. He also would like for someone to send him the cost of the x-ray in a message thru [place] [place].

## 2021-06-13 ENCOUNTER — Other Ambulatory Visit: Payer: Self-pay | Admitting: Urology

## 2021-06-18 ENCOUNTER — Ambulatory Visit: Payer: BC Managed Care – PPO | Admitting: Urology

## 2021-06-19 ENCOUNTER — Ambulatory Visit: Payer: Self-pay | Admitting: Urology

## 2021-06-22 ENCOUNTER — Other Ambulatory Visit: Payer: Self-pay | Admitting: Family Medicine

## 2021-06-22 DIAGNOSIS — K219 Gastro-esophageal reflux disease without esophagitis: Secondary | ICD-10-CM

## 2021-07-01 ENCOUNTER — Ambulatory Visit: Payer: Self-pay | Admitting: Urology

## 2021-07-06 ENCOUNTER — Other Ambulatory Visit: Payer: Self-pay | Admitting: Psychiatry

## 2021-07-06 DIAGNOSIS — F39 Unspecified mood [affective] disorder: Secondary | ICD-10-CM

## 2021-07-08 ENCOUNTER — Other Ambulatory Visit: Payer: Self-pay | Admitting: Psychiatry

## 2021-07-08 ENCOUNTER — Telehealth: Payer: Self-pay

## 2021-07-08 MED ORDER — MIRTAZAPINE 7.5 MG PO TABS
7.5000 mg | ORAL_TABLET | Freq: Every day | ORAL | 0 refills | Status: DC
Start: 2021-07-08 — End: 2021-08-18

## 2021-07-08 NOTE — Telephone Encounter (Signed)
received a message from suzanne beck rn that patient wants a refill on the remeron.

## 2021-07-08 NOTE — Telephone Encounter (Signed)
Discussed with the patient. He did discontinue mirtazapine, and he started to have anxiety and insomnia after two weeks or so. He wants to restart mirtazapine 7.5 mg. Order was sent.

## 2021-07-08 NOTE — Telephone Encounter (Signed)
spoke with patient and according to the note on 04-29-21 pt was supposed to have discontinued medication. but according to him he states that was not what was suppose to happen.  he wants to speak with dr. Modesta Messing

## 2021-07-17 ENCOUNTER — Ambulatory Visit: Payer: Self-pay | Admitting: Urology

## 2021-07-24 NOTE — Progress Notes (Unsigned)
BH MD/PA/NP OP Progress Note  07/24/2021 12:15 PM Dustin Paul  MRN:  [id number]  Chief Complaint: No chief complaint on file.  HPI: *** Visit Diagnosis: No diagnosis found.  Past Psychiatric History: Please see initial evaluation for full details. I have reviewed the history. No updates at this time.     Past Medical History:  Past Medical History:  Diagnosis Date   Aneurysm of artery ([place]) 2011   LEFT (19 MM) AND RIGHT (11 MM) middle cerebral   Chronic kidney disease    H/O STONES   Complication of anesthesia    Headache    MIGRAINES   Insomnia    Lumbar stress fracture    PONV (postoperative nausea and vomiting)    AFTER KNEE SURGERY IN 1980'S   Seasonal allergies    Seizures ([place])    last seizure in 2015   Visual hallucinations     Past Surgical History:  Procedure Laterality Date   BRAIN SURGERY  2011   2 ANEURYSMS-TOTAL OF 9 CLIPS IN BRAIN FROM ANEURYSM   COLONOSCOPY WITH PROPOFOL N/A 01/16/2015   Procedure: COLONOSCOPY WITH PROPOFOL;  Surgeon: Hulen Luster, MD;  Location: [facility] ENDOSCOPY;  Service: Gastroenterology;  Laterality: N/A;   ESOPHAGOGASTRODUODENOSCOPY (EGD) WITH PROPOFOL N/A 12/05/2018   Procedure: ESOPHAGOGASTRODUODENOSCOPY (EGD) WITH PROPOFOL;  Surgeon: Toledo, Benay Pike, MD;  Location: [facility] ENDOSCOPY;  Service: Gastroenterology;  Laterality: N/A;   KNEE ARTHROSCOPY WITH MEDIAL MENISECTOMY Left 11/20/2015   Procedure: KNEE ARTHROSCOPY WITH partial MEDIAL MENISECTOMY / WITH REMOVAL OF LOOSE BODY;  Surgeon: Hessie Knows, MD;  Location: [facility] ORS;  Service: Orthopedics;  Laterality: Left;   KNEE SURGERY Left     Family Psychiatric History: Please see initial evaluation for full details. I have reviewed the history. No updates at this time.     Family History:  Family History  Problem Relation Age of Onset   Depression Maternal Aunt     Social History:  Social History   Socioeconomic History   Marital status: Married    Spouse name: Not on file    Number of children: 2   Years of education: Not on file   Highest education level: Bachelor's degree (e.g., BA, AB, BS)  Occupational History   Occupation: engineer/retired  Tobacco Use   Smoking status: Never   Smokeless tobacco: Never  Vaping Use   Vaping Use: Never used  Substance and Sexual Activity   Alcohol use: No    Alcohol/week: 0.0 standard drinks    Comment: 1-2 drinks a month   Drug use: No   Sexual activity: Not Currently  Other Topics Concern   Not on file  Social History Narrative   Not on file   Social Determinants of Health   Financial Resource Strain: Not on file  Food Insecurity: Not on file  Transportation Needs: Not on file  Physical Activity: Not on file  Stress: Not on file  Social Connections: Not on file    Allergies:  Allergies  Allergen Reactions   Levetiracetam Other (See Comments)    Racing thoughts per [facility] neurology note 03-11-11 Other reaction(s): Other (See Comments) Racing thoughts   Aspirin     Nose bleed, thin blood    Metabolic Disorder Labs: No results found for: HGBA1C, MPG Lab Results  Component Value Date   PROLACTIN 18.9 (H) 06/25/2015   Lab Results  Component Value Date   CHOL 136 06/05/2021   TRIG 113 06/05/2021   HDL 35 (L) 06/05/2021  CHOLHDL 3.9 06/05/2021   LDLCALC 80 06/05/2021   LDLCALC 77 02/19/2020   Lab Results  Component Value Date   TSH 2.430 06/25/2015   TSH 2.43 06/25/2015    Therapeutic Level Labs: No results found for: LITHIUM No results found for: VALPROATE No components found for:  CBMZ  Current Medications: Current Outpatient Medications  Medication Sig Dispense Refill   Armodafinil 250 MG tablet Take 250 mg by mouth daily.     cyclobenzaprine (FLEXERIL) 10 MG tablet Take 10 mg by mouth 3 (three) times daily as needed. Reported on 05/15/2015     famotidine-calcium carbonate-magnesium hydroxide (PEPCID COMPLETE) 10-800-165 MG chewable tablet Chew 1 tablet by mouth daily as needed.      lamoTRIgine (LAMICTAL) 150 MG tablet Take 1 tablet (150 mg total) by mouth 2 (two) times daily. 180 tablet 0   lisinopril (ZESTRIL) 5 MG tablet TAKE ONE TABLET (5 MG) BY MOUTH EVERY DAY 90 tablet 2   Melatonin 10 MG CAPS Take 1 capsule by mouth at bedtime.     mirtazapine (REMERON) 7.5 MG tablet Take 1 tablet (7.5 mg total) by mouth at bedtime. 90 tablet 0   QUDEXY XR 150 MG CS24 take 1 capsule by mouth daily at bedtime  11   RABEprazole (ACIPHEX) 20 MG tablet TAKE ONE TABLET EVERY DAY 90 tablet 4   tadalafil (CIALIS) 10 MG tablet 1 tablet 1 hour prior to intercourse 90 tablet 3   tadalafil (CIALIS) 20 MG tablet TAKE ONE TABLET BY MOUTH 1 HOUR PRIOR TOINTERCOURSE 30 tablet 3   traZODone (DESYREL) 100 MG tablet Take 2 tablets (200 mg total) by mouth at bedtime as needed for sleep. 180 tablet 0   No current facility-administered medications for this visit.     Musculoskeletal: Strength & Muscle Tone:  N/A Gait & Station:  N/A Patient leans: N/A  Psychiatric Specialty Exam: Review of Systems  There were no vitals taken for this visit.There is no height or weight on file to calculate BMI.  General Appearance: {Appearance:22683}  Eye Contact:  {BHH EYE CONTACT:22684}  Speech:  Clear and Coherent  Volume:  Normal  Mood:  {BHH MOOD:22306}  Affect:  {Affect (PAA):22687}  Thought Process:  Coherent  Orientation:  Full (Time, Place, and Person)  Thought Content: Logical   Suicidal Thoughts:  {ST/HT (PAA):22692}  Homicidal Thoughts:  {ST/HT (PAA):22692}  Memory:  Immediate;   Good  Judgement:  {Judgement (PAA):22694}  Insight:  {Insight (PAA):22695}  Psychomotor Activity:  Normal  Concentration:  Concentration: Good and Attention Span: Good  Recall:  Good  Fund of Knowledge: Good  Language: Good  Akathisia:  No  Handed:  Right  AIMS (if indicated): not done  Assets:  Communication Skills Desire for Improvement  ADL's:  Intact  Cognition: WNL  Sleep:  {BHH  GOOD/FAIR/POOR:22877}   Screenings: AIMS    Flowsheet Row Office Visit from 07/12/2016 in [place] Total Score 0      PHQ2-9    [place] Visit from 03/24/2021 in [place] Video Visit from 01/06/2021 in [place] Video Visit from 11/27/2020 in [facility] Office Visit from 02/15/2020 in [place] Visit from 02/13/2019 in [place]  PHQ-2 Total Score 0 0 0 0 0  PHQ-9 Total Score 1 -- -- -- 1      Flowsheet Row Video Visit from 01/06/2021 in [place] Video Visit from 11/27/2020 in  [place]  C-SSRS RISK CATEGORY No Risk No Risk        Assessment and Plan:  Dustin Paul is a 61 y.o. year old male with a history of episodic mood disorder, insomnia, CKD, migraine, cerebral aneurysm, hx of bilateral MCA aneurysms s/p clipping on the left,, who presents for follow up appointment for below.   1. Recurrent major depressive disorder, in full remission ([place]) He denies any mood symptoms except self-limited anxiety in the context of stress at work since the last visit.  He has episodes of depression since brain surgery 10 years ago, and most recently during summer in the context of stress at previous work.  He likes his new job, and enjoys connection with his family.  He is willing to discontinue mirtazapine; discussed potential risk of relapse in depressive symptoms.  He is advised to contact the office if any worsening in his symptoms.    2. Insomnia, unspecified type He reports good benefit from trazodone.  Will continue current dose to target insomnia.      Plan Discontinue mirtazapine Continue Trazodone 200 mg at night as needed for insomnia Next appointment- 10/5 at 10 AM for 30 mins, video - on lamotrigine 150 mg twice a day for seizure. He states that this  medication is prescribed by his neurologist - on flexeril, armodafinil prn     The patient demonstrates the following risk factors for suicide: Chronic risk factors for suicide include: psychiatric disorder of depression . Acute risk factors for suicide include: N/A. Protective factors for this patient include: positive social support, coping skills, and hope for the future. Considering these factors, the overall suicide risk at this point appears to be low. Patient is appropriate for outpatient follow up.    Collaboration of Care: Collaboration of Care: {BH OP Collaboration of Care:21014065}  Patient/Guardian was advised Release of Information must be obtained prior to any record release in order to collaborate their care with an outside provider. Patient/Guardian was advised if they have not already done so to contact the registration department to sign all necessary forms in order for [place] to release information regarding their care.   Consent: Patient/Guardian gives verbal consent for treatment and assignment of benefits for services provided during this visit. Patient/Guardian expressed understanding and agreed to proceed.    [name], MD 07/24/2021, 12:15 PM

## 2021-07-28 ENCOUNTER — Telehealth: Payer: Self-pay | Admitting: Psychiatry

## 2021-07-28 ENCOUNTER — Other Ambulatory Visit: Payer: Self-pay

## 2021-07-30 ENCOUNTER — Ambulatory Visit: Payer: Self-pay | Admitting: Urology

## 2021-08-11 ENCOUNTER — Telehealth (HOSPITAL_COMMUNITY): Payer: Self-pay | Admitting: Psychiatry

## 2021-08-11 NOTE — Telephone Encounter (Signed)
Talked with the pharmacy. They do have 2 refills left from the last order. Please advise him to get it at the pharmacy.

## 2021-08-11 NOTE — Telephone Encounter (Signed)
I have not seen him since last Oct. Could you make sure that he has follow up appointment, and let me know. Thanks

## 2021-08-11 NOTE — Telephone Encounter (Signed)
pt called states he needs a refill on the mirtazapine 7.'5mg'$  [facility] ?

## 2021-08-11 NOTE — Telephone Encounter (Signed)
Left message for patient ?Informed him to call the office to make an appointment.  ?

## 2021-08-11 NOTE — Telephone Encounter (Signed)
Pt calling ?He needs refill on mirtazapine ?[facility] ? ?CB # [phone]  ?

## 2021-08-12 ENCOUNTER — Ambulatory Visit: Payer: Self-pay | Admitting: Urology

## 2021-08-13 NOTE — Progress Notes (Signed)
Virtual Visit via Video Note ? ?I connected with Dustin Paul on 08/18/21 at  8:00 AM EDT by a video enabled telemedicine application and verified that I am speaking with the correct person using two identifiers. ? ?Location: ?Patient: home ?Provider: office ?Persons participated in the visit- patient, provider  ?  ?I discussed the limitations of evaluation and management by telemedicine and the availability of in person appointments. The patient expressed understanding and agreed to proceed. ?I discussed the assessment and treatment plan with the patient. The patient was provided an opportunity to ask questions and all were answered. The patient agreed with the plan and demonstrated an understanding of the instructions. ?  ?The patient was advised to call back or seek an in-person evaluation if the symptoms worsen or if the condition fails to improve as anticipated. ? ?I provided 17 minutes of non-face-to-face time during this encounter. ? ? ?Dustin Clay, MD ? ? ? ?BH MD/PA/NP OP Progress Note ? ?08/18/2021 8:32 AM ?Dustin Paul  ?MRN:  [id number] ? ?Chief Complaint:  ?Chief Complaint  ?Patient presents with  ? Depression  ? Follow-up  ? Anxiety  ? ?HPI:  ?This is a follow-up appointment for depression and anxiety.  ?-Since the last visit, mirtazapine was restarted due to worsening in anxiety.  ?He states that he was unable to get mirtazapine for the past week, although he did contact our office. He was able to fill the medication yesterday.  He thinks his anxiety has been better since restarting mirtazapine.  However, he tends to feel down about the situation of her mother.  He is trying to get her to [place], although he does not know she would do it.  He is planning to visit her in [place].  Although he has been stressed at work, he loves his work, and also enjoys going to places with short notice.  He reports great relationship with his wife, and he enjoys watching basketball game.  He has  occasional insomnia with racing thoughts .  Although he has those thoughts during the day as well, he has been able to focus well.  He thinks it has been better since being back on mirtazapine. He denies change in appetite.  He denies SI.  He denies panic attacks.  He occasionally drinks margarita.  He denies drug use.  He is willing to try higher dose of mirtazapine at this time.  ? ? ?Visit Diagnosis:  ?  ICD-10-CM   ?1. MDD (major depressive disorder), recurrent episode, mild ([place])  F33.0   ?  ?2. Insomnia, unspecified type  G47.00   ?  ? ? ?Past Psychiatric History: Please see initial evaluation for full details. I have reviewed the history. No updates at this time.  ?  ? ?Past Medical History:  ?Past Medical History:  ?Diagnosis Date  ? Aneurysm of artery ([place]) 2011  ? LEFT (19 MM) AND RIGHT (11 MM) middle cerebral  ? Chronic kidney disease   ? H/O STONES  ? Complication of anesthesia   ? Headache   ? MIGRAINES  ? Insomnia   ? Lumbar stress fracture   ? PONV (postoperative nausea and vomiting)   ? AFTER KNEE SURGERY IN 1980'S  ? Seasonal allergies   ? Seizures ([place])   ? last seizure in 2015  ? Visual hallucinations   ?  ?Past Surgical History:  ?Procedure Laterality Date  ? BRAIN SURGERY  2011  ? 2 ANEURYSMS-TOTAL OF 9 CLIPS IN BRAIN FROM ANEURYSM  ?  COLONOSCOPY WITH PROPOFOL N/A 01/16/2015  ? Procedure: COLONOSCOPY WITH PROPOFOL;  Surgeon: Hulen Luster, MD;  Location: [facility] ENDOSCOPY;  Service: Gastroenterology;  Laterality: N/A;  ? ESOPHAGOGASTRODUODENOSCOPY (EGD) WITH PROPOFOL N/A 12/05/2018  ? Procedure: ESOPHAGOGASTRODUODENOSCOPY (EGD) WITH PROPOFOL;  Surgeon: Toledo, Benay Pike, MD;  Location: [facility] ENDOSCOPY;  Service: Gastroenterology;  Laterality: N/A;  ? KNEE ARTHROSCOPY WITH MEDIAL MENISECTOMY Left 11/20/2015  ? Procedure: KNEE ARTHROSCOPY WITH partial MEDIAL MENISECTOMY / WITH REMOVAL OF LOOSE BODY;  Surgeon: Hessie Knows, MD;  Location: [facility] ORS;  Service: Orthopedics;  Laterality: Left;  ? KNEE SURGERY Left    ? ? ?Family Psychiatric History: Please see initial evaluation for full details. I have reviewed the history. No updates at this time.  ?  ? ?Family History:  ?Family History  ?Problem Relation Age of Onset  ? Depression Maternal Aunt   ? ? ?Social History:  ?Social History  ? ?Socioeconomic History  ? Marital status: Married  ?  Spouse name: Not on file  ? Number of children: 2  ? Years of education: Not on file  ? Highest education level: Bachelor's degree (e.g., BA, AB, BS)  ?Occupational History  ? Occupation: engineer/retired  ?Tobacco Use  ? Smoking status: Never  ? Smokeless tobacco: Never  ?Vaping Use  ? Vaping Use: Never used  ?Substance and Sexual Activity  ? Alcohol use: No  ?  Alcohol/week: 0.0 standard drinks  ?  Comment: 1-2 drinks a month  ? Drug use: No  ? Sexual activity: Not Currently  ?Other Topics Concern  ? Not on file  ?Social History Narrative  ? Not on file  ? ?Social Determinants of Health  ? ?Financial Resource Strain: Not on file  ?Food Insecurity: Not on file  ?Transportation Needs: Not on file  ?Physical Activity: Not on file  ?Stress: Not on file  ?Social Connections: Not on file  ? ? ?Allergies:  ?Allergies  ?Allergen Reactions  ? Levetiracetam Other (See Comments)  ?  Racing thoughts per [facility] neurology note 03-11-11 ?Other reaction(s): Other (See Comments) ?Racing thoughts  ? Aspirin   ?  Nose bleed, thin blood  ? ? ?Metabolic Disorder Labs: ?No results found for: HGBA1C, MPG ?Lab Results  ?Component Value Date  ? PROLACTIN 18.9 (H) 06/25/2015  ? ?Lab Results  ?Component Value Date  ? CHOL 136 06/05/2021  ? TRIG 113 06/05/2021  ? HDL 35 (L) 06/05/2021  ? CHOLHDL 3.9 06/05/2021  ? [place] 80 06/05/2021  ? [place] 77 02/19/2020  ? ?Lab Results  ?Component Value Date  ? TSH 2.430 06/25/2015  ? TSH 2.43 06/25/2015  ? ? ?Therapeutic Level Labs: ?No results found for: LITHIUM ?No results found for: VALPROATE ?No components found for:  CBMZ ? ?Current Medications: ?Current Outpatient  Medications  ?Medication Sig Dispense Refill  ? Armodafinil 250 MG tablet Take 250 mg by mouth daily.    ? cyclobenzaprine (FLEXERIL) 10 MG tablet Take 10 mg by mouth 3 (three) times daily as needed. Reported on 05/15/2015    ? famotidine-calcium carbonate-magnesium hydroxide (PEPCID COMPLETE) 10-800-165 MG chewable tablet Chew 1 tablet by mouth daily as needed.    ? lamoTRIgine (LAMICTAL) 150 MG tablet Take 1 tablet (150 mg total) by mouth 2 (two) times daily. 180 tablet 0  ? lisinopril (ZESTRIL) 5 MG tablet TAKE ONE TABLET (5 MG) BY MOUTH EVERY DAY 90 tablet 2  ? Melatonin 10 MG CAPS Take 1 capsule by mouth at bedtime.    ? [START ON  08/25/2021] mirtazapine (REMERON) 15 MG tablet Take 1 tablet (15 mg total) by mouth at bedtime. 30 tablet 1  ? mirtazapine (REMERON) 7.5 MG tablet Take 1 tablet (7.5 mg total) by mouth at bedtime. 90 tablet 0  ? QUDEXY XR 150 MG CS24 take 1 capsule by mouth daily at bedtime  11  ? RABEprazole (ACIPHEX) 20 MG tablet TAKE ONE TABLET EVERY DAY 90 tablet 4  ? tadalafil (CIALIS) 10 MG tablet 1 tablet 1 hour prior to intercourse 90 tablet 3  ? tadalafil (CIALIS) 20 MG tablet TAKE ONE TABLET BY MOUTH 1 HOUR PRIOR TOINTERCOURSE 30 tablet 3  ? traZODone (DESYREL) 100 MG tablet Take 2 tablets (200 mg total) by mouth at bedtime as needed for sleep. 180 tablet 0  ? ?No current facility-administered medications for this visit.  ? ? ? ?Musculoskeletal: ?Strength & Muscle Tone:  N/A ?Gait & Station:  N/A ?Patient leans: N/A ? ?Psychiatric Specialty Exam: ?Review of Systems  ?Psychiatric/Behavioral:  Positive for dysphoric mood and sleep disturbance. Negative for agitation, behavioral problems, confusion, decreased concentration, hallucinations, self-injury and suicidal ideas. The patient is nervous/anxious. The patient is not hyperactive.   ?All other systems reviewed and are negative.  ?There were no vitals taken for this visit.There is no height or weight on file to calculate BMI.  ?General  Appearance: Fairly Groomed  ?Eye Contact:  Good  ?Speech:  Clear and Coherent  ?Volume:  Normal  ?Mood:   fine  ?Affect:  Appropriate, Congruent, and euthymic  ?Thought Process:  Coherent  ?Orientation:  Full (Ti

## 2021-08-15 ENCOUNTER — Other Ambulatory Visit (HOSPITAL_COMMUNITY): Payer: Self-pay | Admitting: Psychiatry

## 2021-08-15 DIAGNOSIS — F39 Unspecified mood [affective] disorder: Secondary | ICD-10-CM

## 2021-08-18 ENCOUNTER — Telehealth (INDEPENDENT_AMBULATORY_CARE_PROVIDER_SITE_OTHER): Payer: BC Managed Care – PPO | Admitting: Psychiatry

## 2021-08-18 ENCOUNTER — Other Ambulatory Visit: Payer: Self-pay

## 2021-08-18 ENCOUNTER — Encounter: Payer: Self-pay | Admitting: Psychiatry

## 2021-08-18 DIAGNOSIS — F330 Major depressive disorder, recurrent, mild: Secondary | ICD-10-CM

## 2021-08-18 DIAGNOSIS — G4700 Insomnia, unspecified: Secondary | ICD-10-CM | POA: Diagnosis not present

## 2021-08-18 MED ORDER — MIRTAZAPINE 15 MG PO TABS: 15.0000 mg | ORAL_TABLET | Freq: Every day | ORAL | 1 refills | Status: DC

## 2021-08-18 NOTE — Addendum Note (Signed)
Addended by: [name] on: 08/18/2021 09:51 AM ? ? Modules accepted: Orders ? ?

## 2021-08-18 NOTE — Patient Instructions (Signed)
Increase mirtazapine 15 mg at night  ?Continue Trazodone 200 mg at night as needed for insomnia ?Next appointment- 5/10 at 8 AM  ? ?Please call the following numbers if needed ?To schedule/cancel an appointment: [phone] ?To speak with nurse (about medication, forms or other concerns): [phone]  ?

## 2021-08-27 NOTE — Telephone Encounter (Signed)
It was already ordered under my name. Please verify with the pharmacy.

## 2021-08-27 NOTE — Telephone Encounter (Signed)
Last ordered: 1 year ago by [place] Crane, MD Last refill: 02/28/2020  ?PATIENT IS NOW DR HISAD'S ?

## 2021-08-28 ENCOUNTER — Ambulatory Visit
Admission: RE | Admit: 2021-08-28 | Discharge: 2021-08-28 | Disposition: A | Discharge status: Home / Self Care | Payer: BC Managed Care – PPO | Attending: Urology | Admitting: Urology

## 2021-08-28 ENCOUNTER — Ambulatory Visit
Admission: RE | Admit: 2021-08-28 | Discharge: 2021-08-28 | Disposition: A | Discharge status: Home / Self Care | Payer: BC Managed Care – PPO | Source: Ambulatory Visit | Attending: Urology | Admitting: Urology

## 2021-08-28 DIAGNOSIS — N200 Calculus of kidney: Secondary | ICD-10-CM

## 2021-09-02 ENCOUNTER — Ambulatory Visit: Payer: Self-pay | Admitting: Urology

## 2021-09-16 ENCOUNTER — Encounter: Payer: Self-pay | Admitting: Urology

## 2021-09-16 ENCOUNTER — Ambulatory Visit: Payer: BC Managed Care – PPO | Admitting: Urology

## 2021-09-16 VITALS — BP 124/80 | HR 94 | Ht 72.0 in | Wt 215.0 lb

## 2021-09-16 DIAGNOSIS — N200 Calculus of kidney: Secondary | ICD-10-CM

## 2021-09-16 DIAGNOSIS — N5201 Erectile dysfunction due to arterial insufficiency: Secondary | ICD-10-CM | POA: Diagnosis not present

## 2021-09-16 MED ORDER — TADALAFIL 20 MG PO TABS: ORAL_TABLET | ORAL | 6 refills | Status: DC

## 2021-09-16 NOTE — Progress Notes (Signed)
? ?09/16/2021 ?12:58 PM  ? ?Dustin Paul ?28-Jun-1960 ?[id number] ? ?Referring provider: Birdie Sons, MD ?[address] ?[address] ?[place],  [place] [address] ? ?Chief Complaint  ?Patient presents with  ? Nephrolithiasis  ? ? ?Urologic history: ?1.  Nephrolithiasis ?-3 mm left renal calculus ?  ?2.  Erectile dysfunction ?-On tadalafil ? ? ?HPI: ?61 y.o. male presents for annual follow-up. ? ?Since last years visit no episodes of renal colic ?Denies flank, abdominal or pelvic pain ?No bothersome LUTS ?Tadalafil effective for ED ?PSA January 2023 stable 2.8 ? ? ?PMH: ?Past Medical History:  ?Diagnosis Date  ? Aneurysm of artery ([place]) 2011  ? LEFT (19 MM) AND RIGHT (11 MM) middle cerebral  ? Chronic kidney disease   ? H/O STONES  ? Complication of anesthesia   ? Headache   ? MIGRAINES  ? Insomnia   ? Lumbar stress fracture   ? PONV (postoperative nausea and vomiting)   ? AFTER KNEE SURGERY IN 1980'S  ? Seasonal allergies   ? Seizures ([place])   ? last seizure in 2015  ? Visual hallucinations   ? ? ?Surgical History: ?Past Surgical History:  ?Procedure Laterality Date  ? BRAIN SURGERY  2011  ? 2 ANEURYSMS-TOTAL OF 9 CLIPS IN BRAIN FROM ANEURYSM  ? COLONOSCOPY WITH PROPOFOL N/A 01/16/2015  ? Procedure: COLONOSCOPY WITH PROPOFOL;  Surgeon: Hulen Luster, MD;  Location: [facility] ENDOSCOPY;  Service: Gastroenterology;  Laterality: N/A;  ? ESOPHAGOGASTRODUODENOSCOPY (EGD) WITH PROPOFOL N/A 12/05/2018  ? Procedure: ESOPHAGOGASTRODUODENOSCOPY (EGD) WITH PROPOFOL;  Surgeon: Toledo, Benay Pike, MD;  Location: [facility] ENDOSCOPY;  Service: Gastroenterology;  Laterality: N/A;  ? KNEE ARTHROSCOPY WITH MEDIAL MENISECTOMY Left 11/20/2015  ? Procedure: KNEE ARTHROSCOPY WITH partial MEDIAL MENISECTOMY / WITH REMOVAL OF LOOSE BODY;  Surgeon: Hessie Knows, MD;  Location: [facility] ORS;  Service: Orthopedics;  Laterality: Left;  ? KNEE SURGERY Left   ? ? ?Home Medications:  ?Allergies as of 09/16/2021   ? ?   Reactions  ? Levetiracetam Other (See Comments)   ? Racing thoughts per [facility] neurology note 03-11-11 ?Other reaction(s): Other (See Comments) ?Racing thoughts  ? Aspirin   ? Nose bleed, thin blood  ? ?  ? ?  ?Medication List  ?  ? ?  ? Accurate as of September 16, 2021 12:58 PM. If you have any questions, ask your nurse or doctor.  ?  ?  ? ?  ? ?Armodafinil 250 MG tablet ?Take 250 mg by mouth daily. ?  ?cyclobenzaprine 10 MG tablet ?Commonly known as: FLEXERIL ?Take 10 mg by mouth 3 (three) times daily as needed. Reported on 05/15/2015 ?  ?famotidine-calcium carbonate-magnesium hydroxide 10-800-165 MG chewable tablet ?Commonly known as: PEPCID COMPLETE ?Chew 1 tablet by mouth daily as needed. ?  ?lamoTRIgine 150 MG tablet ?Commonly known as: LAMICTAL ?Take 1 tablet (150 mg total) by mouth 2 (two) times daily. ?  ?lisinopril 5 MG tablet ?Commonly known as: ZESTRIL ?TAKE ONE TABLET (5 MG) BY MOUTH EVERY DAY ?  ?Melatonin 10 MG Caps ?Take 1 capsule by mouth at bedtime. ?  ?mirtazapine 15 MG tablet ?Commonly known as: REMERON ?Take 1 tablet (15 mg total) by mouth at bedtime. ?  ?Qudexy XR 150 MG Cs24 sprinkle capsule ?Generic drug: topiramate ER ?take 1 capsule by mouth daily at bedtime ?  ?RABEprazole 20 MG tablet ?Commonly known as: ACIPHEX ?TAKE ONE TABLET EVERY DAY ?  ?tadalafil 10 MG tablet ?Commonly known as: CIALIS ?1 tablet 1 hour prior  to intercourse ?  ?tadalafil 20 MG tablet ?Commonly known as: CIALIS ?TAKE ONE TABLET BY MOUTH 1 HOUR PRIOR TOINTERCOURSE ?  ?traZODone 100 MG tablet ?Commonly known as: DESYREL ?Take 2 tablets (200 mg total) by mouth at bedtime as needed for sleep. ?  ? ?  ? ? ?Allergies:  ?Allergies  ?Allergen Reactions  ? Levetiracetam Other (See Comments)  ?  Racing thoughts per [facility] neurology note 03-11-11 ?Other reaction(s): Other (See Comments) ?Racing thoughts  ? Aspirin   ?  Nose bleed, thin blood  ? ? ?Family History: ?Family History  ?Problem Relation Age of Onset  ? Depression Maternal Aunt   ? ? ?Social History:  reports that he has  never smoked. He has never used smokeless tobacco. He reports that he does not drink alcohol and does not use drugs. ? ? ?Physical Exam: ?BP 124/80   Pulse 94   Ht 6' (1.829 m)   Wt 215 lb (97.5 kg)   BMI 29.16 kg/m?   ?Constitutional:  Alert and oriented, No acute distress. ?HEENT: [place] AT, moist mucus membranes.  Trachea midline, no masses. ?Cardiovascular: No clubbing, cyanosis, or edema. ?Respiratory: Normal respiratory effort, no increased work of breathing. ?Skin: No rashes, bruises or suspicious lesions. ?GU: Declined DRE ?Neurologic: Grossly intact, no focal deficits, moving all 4 extremities. ?Psychiatric: Normal mood and affect. ? ? ?Pertinent Imaging: ?KUB personally reviewed and interpreted.  Stable 3 mm left renal calculus ? ? ?Assessment & Plan:   ? ?1.  Left nephrolithiasis ?Stable calculus ?Continue annual follow-up ? ?2. Erectile dysfunction ?Stable ?Tadalafil refilled ? ? ?Abbie Sons, MD ? ?[place] ?[address], [address] ?[place], [place] [address] ?[phone] ? ?

## 2021-09-18 ENCOUNTER — Telehealth (HOSPITAL_COMMUNITY): Payer: Self-pay

## 2021-09-22 NOTE — Telephone Encounter (Signed)
Medication management - Telephone call with patient, after he left a message questioning if Dustin Paul send in a total of 200 mg Trazodone for sleep as he states his pharmacy only filled '150mg'$  with most recent order.  Informed of order sent 07/20/21 for the correct total of 200 mg dosage at night as needed by Dr. Modesta Messing.  Patient will follow up with his [facility] and contact our office back if any other issues as last order was for a 90 day supply.  ?

## 2021-10-05 NOTE — Progress Notes (Deleted)
[facility] MD/PA/NP OP Progress Note  10/05/2021 8:01 AM Dustin Paul  MRN:  [id number]  Chief Complaint: No chief complaint on file.  HPI: *** Visit Diagnosis: No diagnosis found.  Past Psychiatric History: Please see initial evaluation for full details. I have reviewed the history. No updates at this time.     Past Medical History:  Past Medical History:  Diagnosis Date   Aneurysm of artery ([place]) 2011   LEFT (19 MM) AND RIGHT (11 MM) middle cerebral   Chronic kidney disease    H/O STONES   Complication of anesthesia    Headache    MIGRAINES   Insomnia    Lumbar stress fracture    PONV (postoperative nausea and vomiting)    AFTER KNEE SURGERY IN 1980'S   Seasonal allergies    Seizures ([place])    last seizure in 2015   Visual hallucinations     Past Surgical History:  Procedure Laterality Date   BRAIN SURGERY  2011   2 ANEURYSMS-TOTAL OF 9 CLIPS IN BRAIN FROM ANEURYSM   COLONOSCOPY WITH PROPOFOL N/A 01/16/2015   Procedure: COLONOSCOPY WITH PROPOFOL;  Surgeon: Hulen Luster, MD;  Location: [facility] ENDOSCOPY;  Service: Gastroenterology;  Laterality: N/A;   ESOPHAGOGASTRODUODENOSCOPY (EGD) WITH PROPOFOL N/A 12/05/2018   Procedure: ESOPHAGOGASTRODUODENOSCOPY (EGD) WITH PROPOFOL;  Surgeon: Toledo, Benay Pike, MD;  Location: [facility] ENDOSCOPY;  Service: Gastroenterology;  Laterality: N/A;   KNEE ARTHROSCOPY WITH MEDIAL MENISECTOMY Left 11/20/2015   Procedure: KNEE ARTHROSCOPY WITH partial MEDIAL MENISECTOMY / WITH REMOVAL OF LOOSE BODY;  Surgeon: Hessie Knows, MD;  Location: [facility] ORS;  Service: Orthopedics;  Laterality: Left;   KNEE SURGERY Left     Family Psychiatric History: Please see initial evaluation for full details. I have reviewed the history. No updates at this time.     Family History:  Family History  Problem Relation Age of Onset   Depression Maternal Aunt     Social History:  Social History   Socioeconomic History   Marital status: Married    Spouse name: Not on file    Number of children: 2   Years of education: Not on file   Highest education level: Bachelor's degree (e.g., BA, AB, BS)  Occupational History   Occupation: engineer/retired  Tobacco Use   Smoking status: Never   Smokeless tobacco: Never  Vaping Use   Vaping Use: Never used  Substance and Sexual Activity   Alcohol use: No    Alcohol/week: 0.0 standard drinks    Comment: 1-2 drinks a month   Drug use: No   Sexual activity: Not Currently  Other Topics Concern   Not on file  Social History Narrative   Not on file   Social Determinants of Health   Financial Resource Strain: Not on file  Food Insecurity: Not on file  Transportation Needs: Not on file  Physical Activity: Not on file  Stress: Not on file  Social Connections: Not on file    Allergies:  Allergies  Allergen Reactions   Levetiracetam Other (See Comments)    Racing thoughts per [facility] neurology note 03-11-11 Other reaction(s): Other (See Comments) Racing thoughts   Aspirin     Nose bleed, thin blood    Metabolic Disorder Labs: No results found for: HGBA1C, MPG Lab Results  Component Value Date   PROLACTIN 18.9 (H) 06/25/2015   Lab Results  Component Value Date   CHOL 136 06/05/2021   TRIG 113 06/05/2021   HDL 35 (L) 06/05/2021  CHOLHDL 3.9 06/05/2021   LDLCALC 80 06/05/2021   LDLCALC 77 02/19/2020   Lab Results  Component Value Date   TSH 2.430 06/25/2015   TSH 2.43 06/25/2015    Therapeutic Level Labs: No results found for: LITHIUM No results found for: VALPROATE No components found for:  CBMZ  Current Medications: Current Outpatient Medications  Medication Sig Dispense Refill   Armodafinil 250 MG tablet Take 250 mg by mouth daily.     cyclobenzaprine (FLEXERIL) 10 MG tablet Take 10 mg by mouth 3 (three) times daily as needed. Reported on 05/15/2015     famotidine-calcium carbonate-magnesium hydroxide (PEPCID COMPLETE) 10-800-165 MG chewable tablet Chew 1 tablet by mouth daily as needed.      lamoTRIgine (LAMICTAL) 150 MG tablet Take 1 tablet (150 mg total) by mouth 2 (two) times daily. 180 tablet 0   lisinopril (ZESTRIL) 5 MG tablet TAKE ONE TABLET (5 MG) BY MOUTH EVERY DAY 90 tablet 2   Melatonin 10 MG CAPS Take 1 capsule by mouth at bedtime.     mirtazapine (REMERON) 15 MG tablet Take 1 tablet (15 mg total) by mouth at bedtime. 30 tablet 1   QUDEXY XR 150 MG CS24 take 1 capsule by mouth daily at bedtime  11   RABEprazole (ACIPHEX) 20 MG tablet TAKE ONE TABLET EVERY DAY 90 tablet 4   tadalafil (CIALIS) 20 MG tablet Take 1 tab 1 hour prior to intercourse 30 tablet 6   traZODone (DESYREL) 100 MG tablet Take 2 tablets (200 mg total) by mouth at bedtime as needed for sleep. 180 tablet 0   No current facility-administered medications for this visit.     Musculoskeletal: Strength & Muscle Tone:  N/A Gait & Station:  N/A Patient leans: N/A  Psychiatric Specialty Exam: Review of Systems  There were no vitals taken for this visit.There is no height or weight on file to calculate BMI.  General Appearance: {Appearance:22683}  Eye Contact:  {BHH EYE CONTACT:22684}  Speech:  Clear and Coherent  Volume:  Normal  Mood:  {BHH MOOD:22306}  Affect:  {Affect (PAA):22687}  Thought Process:  Coherent  Orientation:  Full (Time, Place, and Person)  Thought Content: Logical   Suicidal Thoughts:  {ST/HT (PAA):22692}  Homicidal Thoughts:  {ST/HT (PAA):22692}  Memory:  Immediate;   Good  Judgement:  {Judgement (PAA):22694}  Insight:  {Insight (PAA):22695}  Psychomotor Activity:  Normal  Concentration:  Concentration: Good and Attention Span: Good  Recall:  Good  Fund of Knowledge: Good  Language: Good  Akathisia:  No  Handed:  Right  AIMS (if indicated): not done  Assets:  Communication Skills Desire for Improvement  ADL's:  Intact  Cognition: WNL  Sleep:  {BHH GOOD/FAIR/POOR:22877}   Screenings: AIMS    Flowsheet Row Office Visit from 07/12/2016 in [place] Total Score 0      PHQ2-9    [place] Visit from 03/24/2021 in [place] Video Visit from 01/06/2021 in [place] Video Visit from 11/27/2020 in [facility] Office Visit from 02/15/2020 in [place] Visit from 02/13/2019 in [place]  PHQ-2 Total Score 0 0 0 0 0  PHQ-9 Total Score 1 -- -- -- 1      Flowsheet Row Video Visit from 01/06/2021 in [place] Video Visit from 11/27/2020 in [place] No Risk No Risk  Assessment and Plan:  NAPOLEON MONACELLI is a 61 y.o. year old male with a history of episodic mood disorder, insomnia, CKD, migraine, cerebral aneurysm, hx of bilateral MCA aneurysms s/p clipping on the left, who presents for follow up appointment for below.     1. MDD (major depressive disorder), recurrent episode, mild ([place]) He reports slight worsening in depressed mood and anxiety in the context of being concerned about her mother, who lives in [place].  He has had episodes of depression since brain surgery 10 years ago, and had relapsed in his mood symptoms in the context of tapering of mirtazapine.  He will likely benefit from maintenance treatment in the future.  Will uptitrate mirtazapine to optimize treatment for depression and anxiety.  Discussed potential metabolic side effect and drowsiness.    2. Insomnia, unspecified type He has occasional insomnia with racing thoughts/anxiety, which has been improving since being on mirtazapine.  We will adjust the dose as above, and will continue trazodone as needed for insomnia.    Plan Increase mirtazapine 15 mg at night  Continue Trazodone 200 mg at night as needed for insomnia Next appointment- 5/10 at 8 AM for 20 mins, video - on lamotrigine 150 mg twice a day for seizure. He states  that this medication is prescribed by his neurologist - on flexeril, armodafinil prn     The patient demonstrates the following risk factors for suicide: Chronic risk factors for suicide include: psychiatric disorder of depression . Acute risk factors for suicide include: N/A. Protective factors for this patient include: positive social support, coping skills, and hope for the future. Considering these factors, the overall suicide risk at this point appears to be low. Patient is appropriate for outpatient follow up.           Collaboration of Care: Collaboration of Care: {BH OP Collaboration of Care:21014065}  Patient/Guardian was advised Release of Information must be obtained prior to any record release in order to collaborate their care with an outside provider. Patient/Guardian was advised if they have not already done so to contact the registration department to sign all necessary forms in order for [place] to release information regarding their care.   Consent: Patient/Guardian gives verbal consent for treatment and assignment of benefits for services provided during this visit. Patient/Guardian expressed understanding and agreed to proceed.    [name], MD 10/05/2021, 8:01 AM

## 2021-10-07 ENCOUNTER — Telehealth: Payer: BC Managed Care – PPO | Admitting: Psychiatry

## 2021-10-13 ENCOUNTER — Other Ambulatory Visit: Payer: Self-pay | Admitting: Psychiatry

## 2021-10-13 NOTE — Telephone Encounter (Signed)
Declined refill of mirtazapine at this time as he should have enough at least for a few weeks. Please contact the patient and make a follow up appointment.  ?

## 2021-11-07 ENCOUNTER — Other Ambulatory Visit: Payer: Self-pay | Admitting: Psychiatry

## 2021-11-12 DIAGNOSIS — M1712 Unilateral primary osteoarthritis, left knee: Secondary | ICD-10-CM | POA: Diagnosis not present

## 2021-11-13 ENCOUNTER — Other Ambulatory Visit: Payer: Self-pay | Admitting: Psychiatry

## 2021-11-13 ENCOUNTER — Telehealth: Payer: Self-pay

## 2021-11-13 MED ORDER — MIRTAZAPINE 15 MG PO TABS: 15.0000 mg | ORAL_TABLET | Freq: Every day | ORAL | 0 refills | Status: DC

## 2021-11-13 NOTE — Telephone Encounter (Signed)
Ordered

## 2021-11-13 NOTE — Telephone Encounter (Signed)
left message for patient to call back to set up an appt.

## 2021-11-13 NOTE — Telephone Encounter (Signed)
message was left that refill needed on the mirtazapine.

## 2021-11-24 ENCOUNTER — Telehealth (INDEPENDENT_AMBULATORY_CARE_PROVIDER_SITE_OTHER): Payer: BC Managed Care – PPO | Admitting: Psychiatry

## 2021-11-24 ENCOUNTER — Encounter: Payer: Self-pay | Admitting: Psychiatry

## 2021-11-24 DIAGNOSIS — F3341 Major depressive disorder, recurrent, in partial remission: Secondary | ICD-10-CM | POA: Diagnosis not present

## 2021-11-24 DIAGNOSIS — G4700 Insomnia, unspecified: Secondary | ICD-10-CM | POA: Diagnosis not present

## 2021-11-24 MED ORDER — TRAZODONE HCL 100 MG PO TABS: 100.0000 mg | ORAL_TABLET | Freq: Every evening | ORAL | 0 refills | Status: DC | PRN

## 2021-11-24 MED ORDER — MIRTAZAPINE 15 MG PO TABS: 15.0000 mg | ORAL_TABLET | Freq: Every day | ORAL | 2 refills | Status: DC

## 2021-12-23 ENCOUNTER — Telehealth: Payer: Self-pay

## 2021-12-23 NOTE — Telephone Encounter (Signed)
Could you tell the pharmacy that he is on 200 mg. When I called the pharmacy last time, there was one refill left.

## 2021-12-23 NOTE — Telephone Encounter (Signed)
pharmacy called requesting a refill on the trazodone '150mg'$ 

## 2021-12-23 NOTE — Telephone Encounter (Signed)
  pt called left message that he needs trazodone '150mg'$  [facility] .  Pt last seen on 11-24-21 next appt 02-23-22   traZODone (DESYREL) 100 MG tablet Medication Date: 11/24/2021 Department: [facility] Ordering/Authorizing: [name], MD   Order Providers  Prescribing Provider Encounter Provider  [name], MD [name], MD   Outpatient Medication Detail   Disp Refills Start End   traZODone (DESYREL) 100 MG tablet 30 tablet 0 01/24/2022 02/23/2022   Sig - Route: Take 1 tablet (100 mg total) by mouth at bedtime as needed for sleep. - Oral   Sent to pharmacy as: traZODone (DESYREL) 100 MG tablet   Notes to Pharmacy: [place] after 8/20. Please fill from other order first.   E-Prescribing Status: Receipt confirmed by pharmacy (11/24/2021  5:09 PM EDT)

## 2021-12-24 ENCOUNTER — Other Ambulatory Visit: Payer: Self-pay | Admitting: Psychiatry

## 2021-12-24 DIAGNOSIS — F5105 Insomnia due to other mental disorder: Secondary | ICD-10-CM

## 2021-12-24 MED ORDER — TRAZODONE HCL 100 MG PO TABS: 200.0000 mg | ORAL_TABLET | Freq: Every evening | ORAL | 0 refills | Status: DC | PRN

## 2021-12-24 NOTE — Telephone Encounter (Signed)
  called pharmacy they states he does have a rx for the trazodone '200mg'$  he will go ahead and fill.    traZODone (DESYREL) 100 MG tablet Medication Date: 11/24/2021 Department: [facility] Ordering/Authorizing: [name], MD   Order Providers  Prescribing Provider Encounter Provider  [name], MD [name], MD   Outpatient Medication Detail   Disp Refills Start End   traZODone (DESYREL) 100 MG tablet 30 tablet 0 01/24/2022 02/23/2022   Sig - Route: Take 1 tablet (100 mg total) by mouth at bedtime as needed for sleep. - Oral   Sent to pharmacy as: traZODone (DESYREL) 100 MG tablet   Notes to Pharmacy: [place] after 8/20. Please fill from other order first.   E-Prescribing Status: Receipt confirmed by pharmacy (11/24/2021  5:09 PM EDT)

## 2021-12-24 NOTE — Telephone Encounter (Signed)
Great, thanks

## 2021-12-25 NOTE — Telephone Encounter (Signed)
rx is ready for the '100mg'$  take 2.

## 2021-12-25 NOTE — Telephone Encounter (Signed)
spoke with patient and let him know that per dr. Modesta Messing he to take '200mg'$   so rx was sent in and per the pharmacy it is ready.

## 2022-01-05 ENCOUNTER — Other Ambulatory Visit: Payer: Self-pay | Admitting: Family Medicine

## 2022-01-08 DIAGNOSIS — M1712 Unilateral primary osteoarthritis, left knee: Secondary | ICD-10-CM | POA: Diagnosis not present

## 2022-01-13 DIAGNOSIS — G43719 Chronic migraine without aura, intractable, without status migrainosus: Secondary | ICD-10-CM | POA: Diagnosis not present

## 2022-01-13 DIAGNOSIS — R52 Pain, unspecified: Secondary | ICD-10-CM | POA: Diagnosis not present

## 2022-02-04 DIAGNOSIS — M1712 Unilateral primary osteoarthritis, left knee: Secondary | ICD-10-CM | POA: Diagnosis not present

## 2022-02-07 ENCOUNTER — Other Ambulatory Visit: Payer: Self-pay | Admitting: Psychiatry

## 2022-02-08 ENCOUNTER — Telehealth: Payer: Self-pay

## 2022-02-08 NOTE — Telephone Encounter (Signed)
received fax request or trazodone hcl '150mg'$  . pt last seen on 11-24-21 next appt 02-23-22.    Disp Refills Start End   traZODone (DESYREL) 100 MG tablet 60 tablet 0 01/24/2022 02/23/2022   Sig - Route: Take 2 tablets (200 mg total) by mouth at bedtime as needed for sleep. - Oral   Sent to pharmacy as: traZODone (DESYREL) 100 MG tablet   Notes to Pharmacy: [place] after 8/20   E-Prescribing Status: Receipt confirmed by pharmacy (12/24/2021  5:32 PM EDT)

## 2022-02-08 NOTE — Telephone Encounter (Signed)
Decline at this time- he has meds until the next visit

## 2022-02-16 ENCOUNTER — Other Ambulatory Visit: Payer: Self-pay | Admitting: Psychiatry

## 2022-02-16 DIAGNOSIS — F5105 Insomnia due to other mental disorder: Secondary | ICD-10-CM

## 2022-02-20 NOTE — Progress Notes (Deleted)
BH MD/PA/NP OP Progress Note  02/20/2022 4:36 PM EROS MONTOUR  MRN:  [id number]  Chief Complaint: No chief complaint on file.  HPI: *** Visit Diagnosis: No diagnosis found.  Past Psychiatric History: Please see initial evaluation for full details. I have reviewed the history. No updates at this time.     Past Medical History:  Past Medical History:  Diagnosis Date   Aneurysm of artery ([place]) 2011   LEFT (19 MM) AND RIGHT (11 MM) middle cerebral   Chronic kidney disease    H/O STONES   Complication of anesthesia    Headache    MIGRAINES   Insomnia    Lumbar stress fracture    PONV (postoperative nausea and vomiting)    AFTER KNEE SURGERY IN 1980'S   Seasonal allergies    Seizures ([place])    last seizure in 2015   Visual hallucinations     Past Surgical History:  Procedure Laterality Date   BRAIN SURGERY  2011   2 ANEURYSMS-TOTAL OF 9 CLIPS IN BRAIN FROM ANEURYSM   COLONOSCOPY WITH PROPOFOL N/A 01/16/2015   Procedure: COLONOSCOPY WITH PROPOFOL;  Surgeon: Hulen Luster, MD;  Location[facility] ENDOSCOPY;  Service: Gastroenterology;  Laterality: N/A;   ESOPHAGOGASTRODUODENOSCOPY (EGD) WITH PROPOFOL N/A 12/05/2018   Procedure: ESOPHAGOGASTRODUODENOSCOPY (EGD) WITH PROPOFOL;  Surgeon: Toledo, Benay Pike, MD;  Location: [facility] ENDOSCOPY;  Service: Gastroenterology;  Laterality: N/A;   KNEE ARTHROSCOPY WITH MEDIAL MENISECTOMY Left 11/20/2015   Procedure: KNEE ARTHROSCOPY WITH partial MEDIAL MENISECTOMY / WITH REMOVAL OF LOOSE BODY;  Surgeon: Hessie Knows, MD;  Location: [facility] ORS;  Service: Orthopedics;  Laterality: Left;   KNEE SURGERY Left     Family Psychiatric History: Please see initial evaluation for full details. I have reviewed the history. No updates at this time.     Family History:  Family History  Problem Relation Age of Onset   Depression Maternal Aunt     Social History:  Social History   Socioeconomic History   Marital status: Married    Spouse name: Not on file    Number of children: 2   Years of education: Not on file   Highest education level: Bachelor's degree (e.g., BA, AB, BS)  Occupational History   Occupation: engineer/retired  Tobacco Use   Smoking status: Never   Smokeless tobacco: Never  Vaping Use   Vaping Use: Never used  Substance and Sexual Activity   Alcohol use: No    Alcohol/week: 0.0 standard drinks of alcohol    Comment: 1-2 drinks a month   Drug use: No   Sexual activity: Not Currently  Other Topics Concern   Not on file  Social History Narrative   Not on file   Social Determinants of Health   Financial Resource Strain: Low Risk  (08/16/2017)   Overall Financial Resource Strain (CARDIA)    Difficulty of Paying Living Expenses: Not hard at all  Food Insecurity: No Food Insecurity (08/16/2017)   Hunger Vital Sign    Worried About Running Out of Food in the Last Year: Never true    Ran Out of Food in the Last Year: Never true  Transportation Needs: No Transportation Needs (08/16/2017)   PRAPARE - Hydrologist (Medical): No    Lack of Transportation (Non-Medical): No  Physical Activity: Not on file  Stress: No Stress Concern Present (08/16/2017)   [place]    Feeling of Stress : Not  at all  Social Connections: Not on file    Allergies:  Allergies  Allergen Reactions   Levetiracetam Other (See Comments)    Racing thoughts per [facility] neurology note 03-11-11 Other reaction(s): Other (See Comments) Racing thoughts   Aspirin     Nose bleed, thin blood    Metabolic Disorder Labs: No results found for: "HGBA1C", "MPG" Lab Results  Component Value Date   PROLACTIN 18.9 (H) 06/25/2015   Lab Results  Component Value Date   CHOL 136 06/05/2021   TRIG 113 06/05/2021   HDL 35 (L) 06/05/2021   CHOLHDL 3.9 06/05/2021   LDLCALC 80 06/05/2021   LDLCALC 77 02/19/2020   Lab Results  Component Value Date   TSH 2.430  06/25/2015   TSH 2.43 06/25/2015    Therapeutic Level Labs: No results found for: "LITHIUM" No results found for: "VALPROATE" No results found for: "CBMZ"  Current Medications: Current Outpatient Medications  Medication Sig Dispense Refill   Armodafinil 250 MG tablet Take 250 mg by mouth daily.     cyclobenzaprine (FLEXERIL) 10 MG tablet Take 10 mg by mouth 3 (three) times daily as needed. Reported on 05/15/2015     famotidine-calcium carbonate-magnesium hydroxide (PEPCID COMPLETE) 10-800-165 MG chewable tablet Chew 1 tablet by mouth daily as needed.     lamoTRIgine (LAMICTAL) 150 MG tablet Take 1 tablet (150 mg total) by mouth 2 (two) times daily. 180 tablet 0   lisinopril (ZESTRIL) 5 MG tablet TAKE ONE TABLET (5 MG) BY MOUTH EVERY DAY 90 tablet 0   Melatonin 10 MG CAPS Take 1 capsule by mouth at bedtime.     mirtazapine (REMERON) 15 MG tablet Take 1 tablet (15 mg total) by mouth at bedtime. 30 tablet 2   QUDEXY XR 150 MG CS24 take 1 capsule by mouth daily at bedtime  11   RABEprazole (ACIPHEX) 20 MG tablet TAKE ONE TABLET EVERY DAY 90 tablet 4   tadalafil (CIALIS) 20 MG tablet Take 1 tab 1 hour prior to intercourse 30 tablet 6   traZODone (DESYREL) 100 MG tablet Take 2 tablets (200 mg total) by mouth at bedtime as needed for sleep. 60 tablet 0   No current facility-administered medications for this visit.     Musculoskeletal: Strength & Muscle Tone: within normal limits Gait & Station: normal Patient leans: N/A  Psychiatric Specialty Exam: Review of Systems  There were no vitals taken for this visit.There is no height or weight on file to calculate BMI.  General Appearance: {Appearance:22683}  Eye Contact:  {BHH EYE CONTACT:22684}  Speech:  Clear and Coherent  Volume:  Normal  Mood:  {BHH MOOD:22306}  Affect:  {Affect (PAA):22687}  Thought Process:  Coherent  Orientation:  Full (Time, Place, and Person)  Thought Content: Logical   Suicidal Thoughts:  {ST/HT (PAA):22692}   Homicidal Thoughts:  {ST/HT (PAA):22692}  Memory:  Immediate;   Good  Judgement:  {Judgement (PAA):22694}  Insight:  {Insight (PAA):22695}  Psychomotor Activity:  Normal  Concentration:  Concentration: Good and Attention Span: Good  Recall:  Good  Fund of Knowledge: Good  Language: Good  Akathisia:  No  Handed:  Right  AIMS (if indicated): not done  Assets:  Communication Skills Desire for Improvement  ADL's:  Intact  Cognition: WNL  Sleep:  {BHH GOOD/FAIR/POOR:22877}   Screenings: AIMS    Flowsheet Row Office Visit from 07/12/2016 in [place] Total Score 0      PHQ2-9    Flowsheet Row  Office Visit from 03/24/2021 in [facility] Video Visit from 01/06/2021 in [place] Video Visit from 11/27/2020 in [facility] Office Visit from 02/15/2020 in [place] Visit from 02/13/2019 in [place]  PHQ-2 Total Score 0 0 0 0 0  PHQ-9 Total Score 1 -- -- -- 1      Flowsheet Row Video Visit from 01/06/2021 in [place] Video Visit from 11/27/2020 in [place] No Risk No Risk        Assessment and Plan:  NICHAEL EHLY is a 61 y.o. year old male with a history of episodic mood disorder, insomnia, CKD, migraine, cerebral aneurysm, hx of bilateral MCA aneurysms s/p clipping on the left , who presents for follow up appointment for below.     1. MDD (major depressive disorder), recurrent, in partial remission ([place]) There has been overall improvement in depressed mood and anxiety since uptitration of mirtazapine. Psychosocial stressors including situation of his mother, who lives in [place]. He has had episodes of depression since brain surgery 10 years ago, and had relapsed in his mood symptoms in the context of tapering off mirtazapine. Will continue this  medication as maintenance treatment for depression.    2. Insomnia, unspecified type Significant improvement since uptitration of mirtazapine. Will continue trazodone prn along with mirtazapine to target insomnia.    Plan (according to the pharmacy, the patient can fill medication only monthly due to insurance) Continue mirtazapine 15 mg at night - filled on 6/16 for 30 days. Continue Trazodone 200 mg at night as needed for insomnia- per pharmacy, Trazodone was filled on 6/10 (150 mg for 30 days from Dr. [facility] Care), then on 6/27 200 mg for 30 days. One refill left Next appointment-  9/26 at 4 PM for 30 mins, in person - on lamotrigine 150 mg twice a day for seizure. He states that this medication is prescribed by his neurologist - on flexeril, armodafinil prn     The patient demonstrates the following risk factors for suicide: Chronic risk factors for suicide include: psychiatric disorder of depression . Acute risk factors for suicide include: N/A. Protective factors for this patient include: positive social support, coping skills, and hope for the future. Considering these factors, the overall suicide risk at this point appears to be low. Patient is appropriate for outpatient follow up.          Collaboration of Care: Collaboration of Care: {BH OP Collaboration of Care:21014065}  Patient/Guardian was advised Release of Information must be obtained prior to any record release in order to collaborate their care with an outside provider. Patient/Guardian was advised if they have not already done so to contact the registration department to sign all necessary forms in order for [place] to release information regarding their care.   Consent: Patient/Guardian gives verbal consent for treatment and assignment of benefits for services provided during this visit. Patient/Guardian expressed understanding and agreed to proceed.    [name], MD 02/20/2022, 4:36 PM

## 2022-02-23 ENCOUNTER — Ambulatory Visit: Payer: BC Managed Care – PPO | Admitting: Psychiatry

## 2022-03-05 ENCOUNTER — Encounter: Payer: Self-pay | Admitting: Emergency Medicine

## 2022-03-05 ENCOUNTER — Ambulatory Visit: Payer: Self-pay | Admitting: *Deleted

## 2022-03-05 ENCOUNTER — Ambulatory Visit (INDEPENDENT_AMBULATORY_CARE_PROVIDER_SITE_OTHER): Payer: Self-pay

## 2022-03-05 ENCOUNTER — Ambulatory Visit
Admission: EM | Admit: 2022-03-05 | Discharge: 2022-03-05 | Disposition: A | Discharge status: Home / Self Care | Payer: BC Managed Care – PPO | Attending: Emergency Medicine | Admitting: Emergency Medicine

## 2022-03-05 ENCOUNTER — Other Ambulatory Visit: Payer: Self-pay

## 2022-03-05 DIAGNOSIS — J209 Acute bronchitis, unspecified: Secondary | ICD-10-CM

## 2022-03-05 DIAGNOSIS — R059 Cough, unspecified: Secondary | ICD-10-CM | POA: Diagnosis not present

## 2022-03-05 DIAGNOSIS — R058 Other specified cough: Secondary | ICD-10-CM

## 2022-03-05 DIAGNOSIS — H6693 Otitis media, unspecified, bilateral: Secondary | ICD-10-CM

## 2022-03-05 LAB — POCT RAPID STREP A (OFFICE): Rapid Strep A Screen: NEGATIVE

## 2022-03-05 MED ORDER — BENZONATATE 100 MG PO CAPS: 100.0000 mg | ORAL_CAPSULE | Freq: Three times a day (TID) | ORAL | 0 refills | Status: DC | PRN

## 2022-03-05 MED ORDER — AMOXICILLIN-POT CLAVULANATE 875-125 MG PO TABS: 1.0000 | ORAL_TABLET | Freq: Two times a day (BID) | ORAL | 0 refills | Status: DC

## 2022-03-05 NOTE — Discharge Instructions (Addendum)
Take the Augmentin as directed.  Follow up with your primary care provider.

## 2022-03-05 NOTE — ED Provider Notes (Signed)
Roderic Palau    CSN: [id number] Arrival date & time: 03/05/22  1712      History   Chief Complaint Chief Complaint  Patient presents with   Cough    Infection, breathing problems. Can't hear.  May have covid or step throat. - Entered by patient   Sore Throat   Otalgia    HPI SHAKIL DIRK is a 61 y.o. male.  Patient presents with 3 day history of bilateral ear pain, sore throat, congestion, cough productive of rust colored sputum.  No fever, rash, shortness of breath, chest pain, vomiting, diarrhea, or other symptoms.  Treatment at home with Tylenol.  His medical history includes hypertension, aneurysm of artery (cerebral), kidney stones, seizures, seasonal allergies, visual hallucinations, mood disorder, chronic headache, GERD, diverticular disease.   The history is provided by the patient and medical records.    Past Medical History:  Diagnosis Date   Aneurysm of artery ([place]) 2011   LEFT (19 MM) AND RIGHT (11 MM) middle cerebral   Chronic kidney disease    H/O STONES   Complication of anesthesia    Headache    MIGRAINES   Insomnia    Lumbar stress fracture    PONV (postoperative nausea and vomiting)    AFTER KNEE SURGERY IN 1980'S   Seasonal allergies    Seizures ([place])    last seizure in 2015   Visual hallucinations     Patient Active Problem List   Diagnosis Date Noted   Gastroesophageal reflux disease 03/24/2021   Episodic mood disorder ([place]) 06/12/2019   History of adenomatous polyp of colon 01/21/2015   Chronic headache 12/05/2014   Compression fracture of lumbar vertebra ([place]) 12/05/2014   DD (diverticular disease) 12/05/2014   Hallucination 12/05/2014   Closed fracture of lumbar vertebra ([place]) 12/05/2014   Diverticular disease of large intestine 12/05/2014   Injury of kidney 08/18/2013   Disturbance, visual, psychophysical 08/16/2013   Episodic memory loss 06/07/2013   Memory loss, short term 01/29/2013   Blurred vision 06/21/2012    Middle cerebral artery aneurysm 06/21/2012   Cephalalgia 06/01/2012   Insomnia due to mental condition 06/01/2012   Seizure disorder ([place]) 10/21/2011   Cardiac arrhythmia 03/12/2010   Cardiac conduction disorder 03/12/2010   Balloon like swelling of an artery of the brain 02/14/2010   Calculus of kidney 10/22/2008   Sleep arousal disorder 08/19/2008   Family history of cancer of digestive system 03/27/2006   Essential (primary) hypertension 03/23/2006    Past Surgical History:  Procedure Laterality Date   BRAIN SURGERY  2011   2 ANEURYSMS-TOTAL OF 9 CLIPS IN BRAIN FROM ANEURYSM   COLONOSCOPY WITH PROPOFOL N/A 01/16/2015   Procedure: COLONOSCOPY WITH PROPOFOL;  Surgeon: Hulen Luster, MD;  Location: [facility] ENDOSCOPY;  Service: Gastroenterology;  Laterality: N/A;   ESOPHAGOGASTRODUODENOSCOPY (EGD) WITH PROPOFOL N/A 12/05/2018   Procedure: ESOPHAGOGASTRODUODENOSCOPY (EGD) WITH PROPOFOL;  Surgeon: Toledo, Benay Pike, MD;  Location: [facility] ENDOSCOPY;  Service: Gastroenterology;  Laterality: N/A;   KNEE ARTHROSCOPY WITH MEDIAL MENISECTOMY Left 11/20/2015   Procedure: KNEE ARTHROSCOPY WITH partial MEDIAL MENISECTOMY / WITH REMOVAL OF LOOSE BODY;  Surgeon: Hessie Knows, MD;  Location: [facility] ORS;  Service: Orthopedics;  Laterality: Left;   KNEE SURGERY Left        Home Medications    Prior to Admission medications   Medication Sig Start Date End Date Taking? Authorizing Provider  amoxicillin-clavulanate (AUGMENTIN) 875-125 MG tablet Take 1 tablet by mouth every 12 (twelve) hours. 03/05/22  Yes Sharion Balloon, NP  benzonatate (TESSALON) 100 MG capsule Take 1 capsule (100 mg total) by mouth 3 (three) times daily as needed for cough. 03/05/22  Yes Sharion Balloon, NP  Armodafinil 250 MG tablet Take 250 mg by mouth daily. 03/13/19   [provider]  cyclobenzaprine (FLEXERIL) 10 MG tablet Take 10 mg by mouth 3 (three) times daily as needed. Reported on 05/15/2015    [provider]   famotidine-calcium carbonate-magnesium hydroxide (PEPCID COMPLETE) 10-800-165 MG chewable tablet Chew 1 tablet by mouth daily as needed.    [provider]  lamoTRIgine (LAMICTAL) 150 MG tablet Take 1 tablet (150 mg total) by mouth 2 (two) times daily. 11/27/20   [place] Crane, MD  lisinopril (ZESTRIL) 5 MG tablet TAKE ONE TABLET (5 MG) BY MOUTH EVERY DAY 01/05/22   Birdie Sons, MD  Melatonin 10 MG CAPS Take 1 capsule by mouth at bedtime. 08/19/08   [provider]  mirtazapine (REMERON) 15 MG tablet Take 1 tablet (15 mg total) by mouth at bedtime. 12/14/21 03/14/22  [name], MD  QUDEXY XR 150 MG CS24 take 1 capsule by mouth daily at bedtime 06/19/15   [provider]  RABEprazole (ACIPHEX) 20 MG tablet TAKE ONE TABLET EVERY DAY 06/22/21   Birdie Sons, MD  tadalafil (CIALIS) 20 MG tablet Take 1 tab 1 hour prior to intercourse 09/16/21   Stoioff, Ronda Fairly, MD  traZODone (DESYREL) 100 MG tablet Take 2 tablets (200 mg total) by mouth at bedtime as needed for sleep. 01/24/22 02/23/22  [name], MD    Family History Family History  Problem Relation Age of Onset   Depression Maternal Aunt     Social History Social History   Tobacco Use   Smoking status: Never   Smokeless tobacco: Never  Vaping Use   Vaping Use: Never used  Substance Use Topics   Alcohol use: No    Alcohol/week: 0.0 standard drinks of alcohol    Comment: 1-2 drinks a month   Drug use: No     Allergies   Levetiracetam and Aspirin   Review of Systems Review of Systems  Constitutional:  Negative for chills and fever.  HENT:  Positive for congestion, ear pain and sore throat.   Respiratory:  Positive for cough. Negative for shortness of breath.   Cardiovascular:  Negative for chest pain and palpitations.  Gastrointestinal:  Negative for diarrhea and vomiting.  Skin:  Negative for color change and rash.  All other systems reviewed and are negative.    Physical Exam Triage  Vital Signs ED Triage Vitals  Enc Vitals Group     BP      Pulse      Resp      Temp      Temp src      SpO2      Weight      Height      Head Circumference      Peak Flow      Pain Score      Pain Loc      Pain Edu?      Excl. in [place]?    No data found.  Updated Vital Signs BP (!) 128/91   Pulse 90   Temp 98.2 F (36.8 C)   Resp 18   SpO2 95%   Visual Acuity Right Eye Distance:   Left Eye Distance:   Bilateral Distance:    Right Eye Near:  Left Eye Near:    Bilateral Near:     Physical Exam Vitals and nursing note reviewed.  Constitutional:      General: He is not in acute distress.    Appearance: Normal appearance. He is well-developed. He is not ill-appearing.  HENT:     Right Ear: Tympanic membrane is erythematous and bulging.     Left Ear: Tympanic membrane is erythematous.     Nose: Nose normal.     Mouth/Throat:     Mouth: Mucous membranes are moist.     Pharynx: Oropharynx is clear.  Cardiovascular:     Rate and Rhythm: Normal rate and regular rhythm.     Heart sounds: Normal heart sounds.  Pulmonary:     Effort: Pulmonary effort is normal. No respiratory distress.     Breath sounds: Normal breath sounds.  Musculoskeletal:     Cervical back: Neck supple.  Skin:    General: Skin is warm and dry.  Neurological:     Mental Status: He is alert.  Psychiatric:        Mood and Affect: Mood normal.        Behavior: Behavior normal.      UC Treatments / Results  Labs (all labs ordered are listed, but only abnormal results are displayed) Labs Reviewed  POCT RAPID STREP A (OFFICE)    EKG   Radiology DG Chest 2 View  Result Date: 03/05/2022 CLINICAL DATA:  Cough productive of dark brown sputum EXAM: CHEST - 2 VIEW COMPARISON:  Radiographs 11/20/2018 FINDINGS: Stable cardiomediastinal silhouette. Bilateral bronchial wall thickening. No focal consolidation, pleural effusion, or pneumothorax. No acute osseous abnormality. IMPRESSION: Findings  suggestive of bronchitis.  No focal pneumonia. Electronically Signed   By: Placido Sou M.D.   On: 03/05/2022 17:33    Procedures Procedures (including critical care time)  Medications Ordered in UC Medications - No data to display  Initial Impression / Assessment and Plan / UC Course  I have reviewed the triage vital signs and the nursing notes.  Pertinent labs & imaging results that were available during my care of the patient were reviewed by me and considered in my medical decision making (see chart for details).   Productive cough, acute bronchitis, bilateral otitis media.  Rapid strep negative.  Chest x-ray shows bronchitis.  Treating with Augmentin.  Tylenol or ibuprofen as needed.  Instructed patient to follow-up with his PCP next week.  Education provided on bronchitis and otitis media.  Patient agrees to plan of care.   Final Clinical Impressions(s) / UC Diagnoses   Final diagnoses:  Productive cough  Acute bronchitis, unspecified organism  Bilateral otitis media, unspecified otitis media type     Discharge Instructions      Take the Augmentin as directed.  Follow up with your primary care provider.        ED Prescriptions     Medication Sig Dispense Auth. Provider   amoxicillin-clavulanate (AUGMENTIN) 875-125 MG tablet Take 1 tablet by mouth every 12 (twelve) hours. 14 tablet Sharion Balloon, NP   benzonatate (TESSALON) 100 MG capsule Take 1 capsule (100 mg total) by mouth 3 (three) times daily as needed for cough. 21 capsule Sharion Balloon, NP      PDMP not reviewed this encounter.   Sharion Balloon, NP 03/05/22 1754

## 2022-03-05 NOTE — Telephone Encounter (Signed)
  Chief Complaint: coughing up brown bloody sputum, requesting strep test Symptoms: unable to lay flat due to coughing spells, productive brown, bloody sputum. Sinus pain.  Frequency: na Pertinent Negatives: Patient denies chest pain, difficulty breathing, unknown if fever present.  Disposition: '[]'$ ED /'[x]'$ Urgent Care (no appt availability in office) / '[]'$ Appointment(In office/virtual)/ '[]'$  [place] [facility]/ '[]'$ Home Care/ '[]'$ Refused Recommended Disposition /'[]'$ [place] [facility]/ '[]'$  Follow-up with PCP Additional Notes:   Patient flying back from [place] and requesting appt today . Recommended patient go to UC for evaluation due to sx. Patient requesting referral to ENT for multiple issues with coughing up blood and feels may be tonsils. Please advise.    Reason for Disposition  Coughing up rusty-colored (reddish-brown) sputum  Answer Assessment - Initial Assessment Questions 1. ONSET: "When did the cough begin?"      Na  2. SEVERITY: "How bad is the cough today?"      Getting worse  3. SPUTUM: "Describe the color of your sputum" (none, dry cough; clear, white, yellow, green)     Brown  4. HEMOPTYSIS: "Are you coughing up any blood?" If so ask: "How much?" (flecks, streaks, tablespoons, etc.)     Yes . Some bloody sputum noted with coughing  5. DIFFICULTY BREATHING: "Are you having difficulty breathing?" If Yes, ask: "How bad is it?" (e.g., mild, moderate, severe)    - MILD: No SOB at rest, mild SOB with walking, speaks normally in sentences, can lie down, no retractions, pulse < 100.    - MODERATE: SOB at rest, SOB with minimal exertion and prefers to sit, cannot lie down flat, speaks in phrases, mild retractions, audible wheezing, pulse 100-120.    - SEVERE: Very SOB at rest, speaks in single words, struggling to breathe, sitting hunched forward, retractions, pulse > 120      No  but can not lay down flat without coughing spells  6. FEVER: "Do you have a fever?" If Yes, ask: "What  is your temperature, how was it measured, and when did it start?"     No  7. CARDIAC HISTORY: "Do you have any history of heart disease?" (e.g., heart attack, congestive heart failure)      na 8. LUNG HISTORY: "Do you have any history of lung disease?"  (e.g., pulmonary embolus, asthma, emphysema)     na 9. PE RISK FACTORS: "Do you have a history of blood clots?" (or: recent major surgery, recent prolonged travel, bedridden)     na 10. OTHER SYMPTOMS: "Do you have any other symptoms?" (e.g., runny nose, wheezing, chest pain)       Coughing up brown bloody sputum. Sore throat  sinus issues per patient  11. PREGNANCY: "Is there any chance you are pregnant?" "When was your last menstrual period?"       na 12. TRAVEL: "Have you traveled out of the country in the last month?" (e.g., travel history, exposures)       No  Protocols used: Cough - Acute Productive-A-AH

## 2022-03-05 NOTE — Telephone Encounter (Signed)
Please advise 

## 2022-03-05 NOTE — ED Triage Notes (Signed)
Pt presents with cough, chest congestion, bilateral ear pain and ST x 3 days.

## 2022-03-08 ENCOUNTER — Other Ambulatory Visit: Payer: Self-pay | Admitting: Psychiatry

## 2022-03-08 DIAGNOSIS — F5105 Insomnia due to other mental disorder: Secondary | ICD-10-CM

## 2022-03-08 DIAGNOSIS — F39 Unspecified mood [affective] disorder: Secondary | ICD-10-CM

## 2022-03-08 NOTE — Telephone Encounter (Addendum)
Please advise him that for his condition, I do not feel comfortable just giving him prescription and not seeing him longer  than three months. Although I will prescribe his medication to last until the expected next appointment, if he misses the appointment for any reason, he will likely run out unless he notifies [place]. Although I understand he may have difficulty in coordinating the schedule, this is what I can. (And I can prescribe refills until the next appointment if that is over a month from now this time). Please let me know if any more questions or concerns.

## 2022-03-08 NOTE — Telephone Encounter (Signed)
Ordered refill of Trazodone per request. Please contact the patient to make follow up appointment. I will not be able to prescribe any more refills without evaluation.

## 2022-03-08 NOTE — Telephone Encounter (Signed)
Patient is asking if he has to be seen for his prescriptions filled by you and does not understand why he has to do this to get his prescription. Wanted me to put in a note to ask why and it is a consistent issue with his medication, Trazodone. Why is it every 3 months he has to been seen? He would like to have this prescription for longer months. He has run out 2 in this year because of this issue. Travels for work and is not always available or appointments are too far out. Please advise.

## 2022-03-10 ENCOUNTER — Ambulatory Visit: Payer: Self-pay

## 2022-03-10 NOTE — Telephone Encounter (Signed)
Chief Complaint: Ongoing cough Symptoms: Productive cough 8/10, green/tan phlegm, wheezing, pain to right side when coughing Frequency: Onset 1 week Pertinent Negatives: Patient denies fever Disposition: '[]'$ ED /'[]'$ Urgent Care (no appt availability in office) / '[x]'$ Appointment(In office/virtual)/ '[]'$  [place] [facility]/ '[]'$ Home Care/ '[]'$ Refused Recommended Disposition /'[]'$ [place] [facility]/ '[]'$  Follow-up with PCP Additional Notes: patient reported going to UC and given medication for bronchial and ear infection as well as cough, but still coughing. No availability with PCP, scheduled tomorrow with another provider.     Summary: cough / rx req   The patient shares that they're continuing to experience a cough that they've been previously seen and treated for   The patient shares that they're continuing to experience discomfort   The patient would like to know if they need to get more xrays   Please contact the patient further when possible     Reason for Disposition  [1] Continuous (nonstop) coughing interferes with work or school AND [2] no improvement using cough treatment per Care Advice  Answer Assessment - Initial Assessment Questions 1. ONSET: "When did the cough begin?"      1 week ago 2. SEVERITY: "How bad is the cough today?"      8/10 3. SPUTUM: "Describe the color of your sputum" (none, dry cough; clear, white, yellow, green)     Yes-green, tan/brown 4. HEMOPTYSIS: "Are you coughing up any blood?" If so ask: "How much?" (flecks, streaks, tablespoons, etc.)     No 5. DIFFICULTY BREATHING: "Are you having difficulty breathing?" If Yes, ask: "How bad is it?" (e.g., mild, moderate, severe)    - MILD: No SOB at rest, mild SOB with walking, speaks normally in sentences, can lie down, no retractions, pulse < 100.    - MODERATE: SOB at rest, SOB with minimal exertion and prefers to sit, cannot lie down flat, speaks in phrases, mild retractions, audible wheezing, pulse 100-120.     - SEVERE: Very SOB at rest, speaks in single words, struggling to breathe, sitting hunched forward, retractions, pulse > 120      No 6. FEVER: "Do you have a fever?" If Yes, ask: "What is your temperature, how was it measured, and when did it start?"     No 7. CARDIAC HISTORY: "Do you have any history of heart disease?" (e.g., heart attack, congestive heart failure)      N/A 8. LUNG HISTORY: "Do you have any history of lung disease?"  (e.g., pulmonary embolus, asthma, emphysema)     N/A 9. PE RISK FACTORS: "Do you have a history of blood clots?" (or: recent major surgery, recent prolonged travel, bedridden)     N/A 10. OTHER SYMPTOMS: "Do you have any other symptoms?" (e.g., runny nose, wheezing, chest pain)       Pain in chest when coughing, wheezing 11. PREGNANCY: "Is there any chance you are pregnant?" "When was your last menstrual period?"       N/A 12. TRAVEL: "Have you traveled out of the country in the last month?" (e.g., travel history, exposures)       N/A  Protocols used: Cough - Acute Productive-A-AH

## 2022-03-11 ENCOUNTER — Encounter: Payer: Self-pay | Admitting: Physician Assistant

## 2022-03-11 ENCOUNTER — Ambulatory Visit: Payer: BC Managed Care – PPO | Admitting: Physician Assistant

## 2022-03-11 VITALS — BP 136/109 | HR 92 | Temp 98.4°F | Resp 18 | Wt 215.0 lb

## 2022-03-11 DIAGNOSIS — R059 Cough, unspecified: Secondary | ICD-10-CM

## 2022-03-11 DIAGNOSIS — R0989 Other specified symptoms and signs involving the circulatory and respiratory systems: Secondary | ICD-10-CM | POA: Diagnosis not present

## 2022-03-11 DIAGNOSIS — R0981 Nasal congestion: Secondary | ICD-10-CM

## 2022-03-11 MED ORDER — FLUTICASONE PROPIONATE 50 MCG/ACT NA SUSP: 2.0000 | Freq: Every day | NASAL | 6 refills | Status: AC

## 2022-03-11 MED ORDER — AZITHROMYCIN 250 MG PO TABS: ORAL_TABLET | ORAL | 0 refills | Status: AC

## 2022-03-11 NOTE — Progress Notes (Signed)
I,April Miller,acting as a Education administrator for [employer], PA-C.,have documented all relevant documentation on the behalf of Mardene Speak, PA-C,as directed by  [employer], PA-C while in the presence of [employer], PA-C.  Established patient visit   Patient: Dustin Paul   DOB: 1960/09/23   61 y.o. Male  MRN: [id number] Visit Date: 03/11/2022  Today's healthcare provider: Mardene Speak, PA-C   Chief Complaint  Patient presents with   Cough   Subjective    HPI  Patient has had a cough for 1 week. Patient was seen at [facility]. Patient has a diagnosis of bronchitis. Patient was giving amoxicillin and Tessalon Pearls. He is almost finished with antibiotic. Patient states some symptoms have subsided. However the cough is not better. Never had similar symptoms before Has been under a lot of stress recently  Medications: Outpatient Medications Prior to Visit  Medication Sig   amoxicillin-clavulanate (AUGMENTIN) 875-125 MG tablet Take 1 tablet by mouth every 12 (twelve) hours.   Armodafinil 250 MG tablet Take 250 mg by mouth daily.   benzonatate (TESSALON) 100 MG capsule Take 1 capsule (100 mg total) by mouth 3 (three) times daily as needed for cough.   cyclobenzaprine (FLEXERIL) 10 MG tablet Take 10 mg by mouth 3 (three) times daily as needed. Reported on 05/15/2015   famotidine-calcium carbonate-magnesium hydroxide (PEPCID COMPLETE) 10-800-165 MG chewable tablet Chew 1 tablet by mouth daily as needed.   lamoTRIgine (LAMICTAL) 150 MG tablet Take 1 tablet (150 mg total) by mouth 2 (two) times daily.   lisinopril (ZESTRIL) 5 MG tablet TAKE ONE TABLET (5 MG) BY MOUTH EVERY DAY   Melatonin 10 MG CAPS Take 1 capsule by mouth at bedtime.   mirtazapine (REMERON) 15 MG tablet Take 1 tablet (15 mg total) by mouth at bedtime.   QUDEXY XR 150 MG CS24 take 1 capsule by mouth daily at bedtime   RABEprazole (ACIPHEX) 20 MG tablet TAKE ONE TABLET EVERY DAY   tadalafil (CIALIS) 20 MG tablet Take 1  tab 1 hour prior to intercourse   traZODone (DESYREL) 100 MG tablet Take 2 tablets (200 mg total) by mouth at bedtime.   No facility-administered medications prior to visit.    Review of Systems  Constitutional:  Negative for appetite change, chills and fever.  Respiratory:  Positive for cough. Negative for chest tightness, shortness of breath and wheezing.   Cardiovascular:  Negative for chest pain and palpitations.  Gastrointestinal:  Negative for abdominal pain, nausea and vomiting.       Objective    BP (!) 136/109 (BP Location: Right Arm, Patient Position: Sitting, Cuff Size: Large)   Pulse 92   Temp 98.4 F (36.9 C) (Oral)   Resp 18   Wt 215 lb (97.5 kg)   SpO2 96%   BMI 29.16 kg/m    Physical Exam Vitals reviewed.  Constitutional:      General: He is not in acute distress.    Appearance: Normal appearance. He is not diaphoretic.  HENT:     Head: Normocephalic and atraumatic.     Right Ear: Tympanic membrane, ear canal and external ear normal.     Left Ear: Tympanic membrane, ear canal and external ear normal.     Nose: Congestion and rhinorrhea present.  Eyes:     General:        Right eye: No discharge.        Left eye: No discharge.     Extraocular Movements: Extraocular movements  intact.     Conjunctiva/sclera: Conjunctivae normal.     Pupils: Pupils are equal, round, and reactive to light.  Cardiovascular:     Rate and Rhythm: Normal rate and regular rhythm.     Pulses: Normal pulses.     Heart sounds: Normal heart sounds. No murmur heard. Pulmonary:     Effort: Pulmonary effort is normal. No respiratory distress.     Breath sounds: Normal breath sounds. No wheezing or rhonchi.  Musculoskeletal:     Cervical back: Neck supple.     Right lower leg: No edema.     Left lower leg: No edema.  Lymphadenopathy:     Cervical: No cervical adenopathy.  Skin:    General: Skin is warm and dry.     Findings: No rash.  Neurological:     Mental Status: He is  alert and oriented to person, place, and time. Mental status is at baseline.  Psychiatric:        Mood and Affect: Mood normal.        Behavior: Behavior normal.        Thought Content: Thought content normal.        Judgment: Judgment normal.      No results found for any visits on 03/11/22.  Assessment & Plan     1. Cough, unspecified type Persistent, day 7 for augumentin - azithromycin (ZITHROMAX) 250 MG tablet; Take 2 tablets on day 1, then 1 tablet daily on days 2 through 5  Dispense: 6 tablet; Refill: 0 - fluticasone (FLONASE) 50 MCG/ACT nasal spray; Place 2 sprays into both nostrils daily.  Dispense: 16 g; Refill: 6 Symptomatic treatment was advised  2. Chest congestion 2/2 cough Symptomatic treatment was advised - azithromycin (ZITHROMAX) 250 MG tablet; Take 2 tablets on day 1, then 1 tablet daily on days 2 through 5  Dispense: 6 tablet; Refill: 0 - fluticasone (FLONASE) 50 MCG/ACT nasal spray; Place 2 sprays into both nostrils daily.  Dispense: 16 g; Refill: 6  3. Nasal congestion Symptomatic treatment was advised: flonase, saline nasal rinse, air humidifier, tenting, Vicks vapo rubs  4. Adjustment anxiety? Relaxation techniques advised Encouraged to take it easy and rest/recover from URI  FU PRN   The patient was advised to call back or seek an in-person evaluation if the symptoms worsen or if the condition fails to improve as anticipated.  I discussed the assessment and treatment plan with the patient. The patient was provided an opportunity to ask questions and all were answered. The patient agreed with the plan and demonstrated an understanding of the instructions.  The entirety of the information documented in the History of Present Illness, Review of Systems and Physical Exam were personally obtained by me. Portions of this information were initially documented by the CMA and reviewed by me for thoroughness and accuracy.  Portions of this note were created using  dictation software and may contain typographical errors.     Mardene Speak, PA-C  [facility] [phone] (phone) [phone] (fax)  [place]

## 2022-03-12 NOTE — Progress Notes (Incomplete)
I,April Miller,acting as a Education administrator for [employer], PA-C.,have documented all relevant documentation on the behalf of Mardene Speak, PA-C,as directed by  [employer], PA-C while in the presence of [employer], PA-C.  Established patient visit   Patient: Dustin Paul   DOB: Aug 31, 1960   61 y.o. Male  MRN: [id number] Visit Date: 03/11/2022  Today's healthcare provider: Mardene Speak, PA-C   Chief Complaint  Patient presents with  . Cough   Subjective    HPI  Patient has had a cough for 1 week. Patient was seen at [facility]. Patient has a diagnosis of bronchitis. Patient was giving amoxicillin and Tessalon Pearls. He is almost finished with antibiotic. Patient states some symptoms have subsided. However the cough is not better. Never had similar symptoms before Has been under a lot of stress recently  Medications: Outpatient Medications Prior to Visit  Medication Sig  . amoxicillin-clavulanate (AUGMENTIN) 875-125 MG tablet Take 1 tablet by mouth every 12 (twelve) hours.  . Armodafinil 250 MG tablet Take 250 mg by mouth daily.  . benzonatate (TESSALON) 100 MG capsule Take 1 capsule (100 mg total) by mouth 3 (three) times daily as needed for cough.  . cyclobenzaprine (FLEXERIL) 10 MG tablet Take 10 mg by mouth 3 (three) times daily as needed. Reported on 05/15/2015  . famotidine-calcium carbonate-magnesium hydroxide (PEPCID COMPLETE) 10-800-165 MG chewable tablet Chew 1 tablet by mouth daily as needed.  . lamoTRIgine (LAMICTAL) 150 MG tablet Take 1 tablet (150 mg total) by mouth 2 (two) times daily.  Marland Kitchen lisinopril (ZESTRIL) 5 MG tablet TAKE ONE TABLET (5 MG) BY MOUTH EVERY DAY  . Melatonin 10 MG CAPS Take 1 capsule by mouth at bedtime.  . mirtazapine (REMERON) 15 MG tablet Take 1 tablet (15 mg total) by mouth at bedtime.  . QUDEXY XR 150 MG CS24 take 1 capsule by mouth daily at bedtime  . RABEprazole (ACIPHEX) 20 MG tablet TAKE ONE TABLET EVERY DAY  . tadalafil (CIALIS) 20 MG  tablet Take 1 tab 1 hour prior to intercourse  . traZODone (DESYREL) 100 MG tablet Take 2 tablets (200 mg total) by mouth at bedtime.   No facility-administered medications prior to visit.    Review of Systems  Constitutional:  Negative for appetite change, chills and fever.  Respiratory:  Positive for cough. Negative for chest tightness, shortness of breath and wheezing.   Cardiovascular:  Negative for chest pain and palpitations.  Gastrointestinal:  Negative for abdominal pain, nausea and vomiting.    {Labs  Heme  Chem  Endocrine  Serology  Results Review (optional):23779}   Objective    BP (!) 136/109 (BP Location: Right Arm, Patient Position: Sitting, Cuff Size: Large)   Pulse 92   Temp 98.4 F (36.9 C) (Oral)   Resp 18   Wt 215 lb (97.5 kg)   SpO2 96%   BMI 29.16 kg/m  {Show previous vital signs (optional):23777}  Physical Exam Vitals reviewed.  Constitutional:      General: He is not in acute distress.    Appearance: Normal appearance. He is not diaphoretic.  HENT:     Head: Normocephalic and atraumatic.     Right Ear: Tympanic membrane, ear canal and external ear normal.     Left Ear: Tympanic membrane, ear canal and external ear normal.     Nose: Congestion and rhinorrhea present.  Eyes:     General: No scleral icterus.       Right eye: No discharge.  Left eye: Discharge present.    Extraocular Movements: Extraocular movements intact.     Conjunctiva/sclera: Conjunctivae normal.     Pupils: Pupils are equal, round, and reactive to light.  Cardiovascular:     Rate and Rhythm: Normal rate and regular rhythm.     Pulses: Normal pulses.     Heart sounds: Normal heart sounds. No murmur heard. Pulmonary:     Effort: Pulmonary effort is normal. No respiratory distress.     Breath sounds: Normal breath sounds. No wheezing or rhonchi.  Musculoskeletal:     Cervical back: Neck supple.     Right lower leg: No edema.     Left lower leg: No edema.   Lymphadenopathy:     Cervical: No cervical adenopathy.  Skin:    General: Skin is warm and dry.     Findings: No rash.  Neurological:     Mental Status: He is alert and oriented to person, place, and time. Mental status is at baseline.  Psychiatric:        Mood and Affect: Mood normal.        Behavior: Behavior normal.        Thought Content: Thought content normal.        Judgment: Judgment normal.      No results found for any visits on 03/11/22.  Assessment & Plan     1. Cough, unspecified type Persistent, day 7 for augumentin - azithromycin (ZITHROMAX) 250 MG tablet; Take 2 tablets on day 1, then 1 tablet daily on days 2 through 5  Dispense: 6 tablet; Refill: 0 - fluticasone (FLONASE) 50 MCG/ACT nasal spray; Place 2 sprays into both nostrils daily.  Dispense: 16 g; Refill: 6 Symptomatic treatment was advised  2. Chest congestion 2/2 cough Symptomatic treatment was advised - azithromycin (ZITHROMAX) 250 MG tablet; Take 2 tablets on day 1, then 1 tablet daily on days 2 through 5  Dispense: 6 tablet; Refill: 0 - fluticasone (FLONASE) 50 MCG/ACT nasal spray; Place 2 sprays into both nostrils daily.  Dispense: 16 g; Refill: 6  3. Nasal congestion Symptomatic treatment was advised: flonase, saline nasal rinse, air humidifier, tenting, vicki rubs  4. Adjustment anxiety Relaxation techniques advised Encouraged to take it easy and rest/recover from URI  FU PRN   The patient was advised to call back or seek an in-person evaluation if the symptoms worsen or if the condition fails to improve as anticipated.  I discussed the assessment and treatment plan with the patient. The patient was provided an opportunity to ask questions and all were answered. The patient agreed with the plan and demonstrated an understanding of the instructions.  The entirety of the information documented in the History of Present Illness, Review of Systems and Physical Exam were personally obtained by  me. Portions of this information were initially documented by the CMA and reviewed by me for thoroughness and accuracy.  Portions of this note were created using dictation software and may contain typographical errors.     Mardene Speak, PA-C  [facility] [phone] (phone) [phone] (fax)  [place]

## 2022-03-18 ENCOUNTER — Encounter: Payer: Self-pay | Admitting: Physician Assistant

## 2022-03-30 ENCOUNTER — Other Ambulatory Visit: Payer: Self-pay | Admitting: Psychiatry

## 2022-03-30 NOTE — Telephone Encounter (Signed)
Ordered mirtazapine refill per request. Please contact the patient to make a follow up appointment.

## 2022-03-31 ENCOUNTER — Telehealth: Payer: Self-pay
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
## 2022-03-31 NOTE — Telephone Encounter (Signed)
Patient called to request a refill for Mirtazapine patient last seen on 11/24/21 with no follow up appt called patient to advise he voiced understand transfer to front desk to schedule appt.

## 2022-04-01 ENCOUNTER — Telehealth (INDEPENDENT_AMBULATORY_CARE_PROVIDER_SITE_OTHER): Payer: BC Managed Care – PPO | Admitting: Psychiatry

## 2022-04-01 ENCOUNTER — Encounter: Payer: Self-pay | Admitting: Psychiatry

## 2022-04-01 DIAGNOSIS — F3342 Major depressive disorder, recurrent, in full remission: Secondary | ICD-10-CM | POA: Diagnosis not present

## 2022-04-01 DIAGNOSIS — F5105 Insomnia due to other mental disorder: Secondary | ICD-10-CM

## 2022-04-01 MED ORDER — MIRTAZAPINE 15 MG PO TABS: 15.0000 mg | ORAL_TABLET | Freq: Every day | ORAL | 3 refills | Status: DC

## 2022-04-01 MED ORDER — TRAZODONE HCL 100 MG PO TABS: 200.0000 mg | ORAL_TABLET | Freq: Every day | ORAL | 3 refills | Status: DC

## 2022-04-01 NOTE — Patient Instructions (Signed)
Continue mirtazapine 15 mg at night  Continue Trazodone 200 mg at night as needed for insomnia Next appointment-  2/28 at 8 AM

## 2022-04-08 ENCOUNTER — Other Ambulatory Visit: Payer: Self-pay | Admitting: Urology

## 2022-04-13 ENCOUNTER — Other Ambulatory Visit: Payer: Self-pay | Admitting: Family Medicine

## 2022-05-20 ENCOUNTER — Other Ambulatory Visit: Payer: Self-pay | Admitting: Family Medicine

## 2022-05-21 ENCOUNTER — Telehealth: Payer: Self-pay

## 2022-05-21 NOTE — Telephone Encounter (Signed)
Patient called, left VM to return the call to the office to schedule CPE, coming due in January. Sent MyChart message as well.

## 2022-05-21 NOTE — Telephone Encounter (Signed)
Requested medication (s) are due for refill today: Yes  Requested medication (s) are on the active medication list: Yes  Last refill:  04/13/22  Future visit scheduled: No  Notes to clinic:  Unable to refill per protocol, appointment needed.      Requested Prescriptions  Pending Prescriptions Disp Refills   lisinopril (ZESTRIL) 5 MG tablet [Pharmacy Med Name: LISINOPRIL 5 MG TAB] 30 tablet 0    Sig: TAKE ONE TABLET (5 MG) BY MOUTH EVERY DAY     Cardiovascular:  ACE Inhibitors Failed - 05/20/2022 10:52 AM      Failed - Cr in normal range and within 180 days    Creatinine, Ser  Date Value Ref Range Status  06/05/2021 1.39 (H) 0.76 - 1.27 mg/dL Final         Failed - K in normal range and within 180 days    Potassium  Date Value Ref Range Status  06/05/2021 4.5 3.5 - 5.2 mmol/L Final         Failed - Last BP in normal range    BP Readings from Last 1 Encounters:  03/11/22 (!) 136/109         Passed - Patient is not pregnant      Passed - Valid encounter within last 6 months    Recent Outpatient Visits           2 months ago Cough, unspecified type   [employer], [place], PA-C   1 year ago Annual physical exam   [facility] Birdie Sons, MD   2 years ago Annual physical exam   [facility] Birdie Sons, MD   2 years ago Essential (primary) hypertension   [place], Kirstie Peri, MD   3 years ago Upper respiratory infection, acute   [place], Kelby Aline, FNP       Future Appointments             In 2 weeks Stoioff, Ronda Fairly, MD [place]   In 3 months Stoioff, Ronda Fairly, MD [place]

## 2022-05-21 NOTE — Telephone Encounter (Signed)
Copied from [place] [id number]. Topic: Appointment Scheduling - Scheduling Inquiry for Clinic >> May 21, 2022  4:24 PM [name] wrote: Reason for CRM: Pt needs a CPE prior to the new year for insurance purposes regarding his lisinopril.  Best contact:

## 2022-05-26 ENCOUNTER — Encounter: Payer: Self-pay | Admitting: Physician Assistant

## 2022-05-26 ENCOUNTER — Ambulatory Visit
Admission: RE | Admit: 2022-05-26 | Discharge: 2022-05-26 | Disposition: A | Discharge status: Home / Self Care | Payer: Managed Care, Other (non HMO) | Source: Ambulatory Visit | Attending: Physician Assistant | Admitting: Physician Assistant

## 2022-05-26 ENCOUNTER — Ambulatory Visit: Payer: Managed Care, Other (non HMO) | Admitting: Physician Assistant

## 2022-05-26 ENCOUNTER — Ambulatory Visit
Admission: RE | Admit: 2022-05-26 | Discharge: 2022-05-26 | Disposition: A | Discharge status: Home / Self Care | Payer: Managed Care, Other (non HMO) | Source: Ambulatory Visit | Attending: Physician Assistant | Admitting: *Deleted

## 2022-05-26 VITALS — BP 150/87 | HR 78 | Ht 72.0 in | Wt 212.0 lb

## 2022-05-26 DIAGNOSIS — R351 Nocturia: Secondary | ICD-10-CM

## 2022-05-26 DIAGNOSIS — Z87442 Personal history of urinary calculi: Secondary | ICD-10-CM

## 2022-05-26 DIAGNOSIS — R350 Frequency of micturition: Secondary | ICD-10-CM

## 2022-05-26 LAB — URINALYSIS, COMPLETE
Bilirubin, UA: NEGATIVE
Glucose, UA: NEGATIVE
Ketones, UA: NEGATIVE
Leukocytes,UA: NEGATIVE
Nitrite, UA: NEGATIVE
Protein,UA: NEGATIVE
RBC, UA: NEGATIVE
Specific Gravity, UA: 1.015 (ref 1.005–1.030)
Urobilinogen, Ur: 0.2 mg/dL (ref 0.2–1.0)
pH, UA: 6.5 (ref 5.0–7.5)

## 2022-05-26 LAB — MICROSCOPIC EXAMINATION: Bacteria, UA: NONE SEEN

## 2022-05-26 LAB — BLADDER SCAN AMB NON-IMAGING: Scan Result: 40

## 2022-05-26 MED ORDER — MIRABEGRON ER 25 MG PO TB24: 25.0000 mg | ORAL_TABLET | Freq: Every day | ORAL | 0 refills | Status: DC

## 2022-05-28 ENCOUNTER — Telehealth: Payer: Self-pay | Admitting: Physician Assistant

## 2022-05-28 NOTE — Progress Notes (Signed)
05/26/2022 8:51 AM   Dustin Paul 02-07-1961 [id number]  CC: Chief Complaint  Patient presents with   Urinary Frequency   HPI: Dustin Paul is a 61 y.o. male with PMH nephrolithiasis and ED on tadalafil who presents today for evaluation of urinary frequency.   Today he reports new nocturia x 1-2 and urinary urgency over the past several weeks.  He has also had some intermittent right flank pain x 4 to 5 days.  He denies fever, chills, nausea, vomiting, gross hematuria, urge incontinence, weak stream, intermittency, hesitancy, or straining.  He had a sleep study around 2012 which was borderline and result.  He does not use a CPAP.  He had severe bronchitis 2 months ago and has had a lingering cough which is progressively improving.  In-office UA and microscopy today pan negative. PVR 31m.  PMH: Past Medical History:  Diagnosis Date   A[place] of artery ([place]   LEFT (19 MM) AND RIGHT (11 MM) middle cerebral   Chronic kidney disease    H/O STONES   Complication of anesthesia    Headache    MIGRAINES   Insomnia    Lumbar stress fracture    PONV (postoperative nausea and vomiting)    AFTER KNEE SURGERY IN 1980'S   Seasonal allergies    [place]    last seizure in 2015   Visual hallucinations     Surgical History: Past Surgical History:  Procedure Laterality Date   BRAIN SURGERY  2011   2 ANEURYSMS-TOTAL OF 9 CLIPS IN BRAIN FROM ANEURYSM   COLONOSCOPY WITH PROPOFOL N/A 01/16/2015   Procedure: COLONOSCOPY WITH PROPOFOL;  Surgeon: PHulen Luster MD;  [facility]  Service: Gastroenterology;  Laterality: N/A;   ESOPHAGOGASTRODUODENOSCOPY (EGD) WITH PROPOFOL N/A 12/05/2018   Procedure: ESOPHAGOGASTRODUODENOSCOPY (EGD) WITH PROPOFOL;  Surgeon: Toledo, TBenay Pike MD;  Location: [facility] ENDOSCOPY;  Service: Gastroenterology;  Laterality: N/A;   KNEE ARTHROSCOPY WITH MEDIAL MENISECTOMY Left 11/20/2015   Procedure: KNEE ARTHROSCOPY WITH partial MEDIAL  MENISECTOMY / WITH REMOVAL OF LOOSE BODY;  Surgeon: MHessie Knows MD;  Location: [facility] ORS;  Service: Orthopedics;  Laterality: Left;   KNEE SURGERY Left     Home Medications:  Allergies as of 05/26/2022       Reactions   Levetiracetam Other (See Comments)   [facility] Other reaction(s): Other (See Comments) Racing thoughts   Aspirin    Nose bleed, thin blood        Medication List        Accurate as of May 26, 2022 11:59 PM. If you have any questions, ask your nurse or doctor.          STOP taking these medications    amoxicillin-clavulanate 875-125 MG tablet Commonly known as: AUGMENTIN Stopped by: SDebroah Loop PA-C   cyclobenzaprine 10 MG tablet Commonly known as: FLEXERIL Stopped by: SDebroah Loop PA-C       TAKE these medications    Armodafinil 250 MG tablet Take 250 mg by mouth daily.   benzonatate 100 MG capsule Commonly known as: TESSALON Take 1 capsule (100 mg total) by mouth 3 (three) times daily as needed for cough.   famotidine-calcium carbonate-magnesium hydroxide 10-800-165 MG chewable tablet Commonly known as: PEPCID COMPLETE Chew 1 tablet by mouth daily as needed.   fluticasone 50 MCG/ACT nasal spray Commonly known as: FLONASE Place 2 sprays into both nostrils daily.   lamoTRIgine 150 MG tablet Commonly known as:  LAMICTAL Take 150 mg by mouth 2 (two) times daily.   lisinopril 5 MG tablet Commonly known as: ZESTRIL TAKE ONE TABLET (5 MG) BY MOUTH EVERY DAY   Melatonin 10 MG Caps Take 1 capsule by mouth at bedtime.   mirabegron ER 25 MG Tb24 tablet Commonly known as: MYRBETRIQ Take 1 tablet (25 mg total) by mouth daily. Started by: Debroah Loop, PA-C   mirtazapine 15 MG tablet Commonly known as: REMERON Take 1 tablet (15 mg total) by mouth at bedtime.   Qudexy XR 150 MG Cs24 sprinkle capsule Generic drug: topiramate ER take 1 capsule by mouth daily at  bedtime   RABEprazole 20 MG tablet Commonly known as: ACIPHEX TAKE ONE TABLET EVERY DAY   tadalafil 20 MG tablet Commonly known as: CIALIS TAKE 1 TABLET 1 HOUR PRIOR TO INTERCOURSE.   traZODone 100 MG tablet Commonly known as: DESYREL Take 2 tablets (200 mg total) by mouth at bedtime.        Allergies:  Allergies  Allergen Reactions   Levetiracetam Other (See Comments)    Racing thoughts per [facility] neurology note 03-11-11 Other reaction(s): Other (See Comments) Racing thoughts   Aspirin     Nose bleed, thin blood    Family History: Family History  Problem Relation Age of Onset   Depression Maternal Aunt     Social History:   reports that he has never smoked. He has never used smokeless tobacco. He reports that he does not drink alcohol and does not use drugs.  Physical Exam: BP (!) 150/87   Pulse 78   Ht 6' (1.829 m)   Wt 212 lb (96.2 kg)   BMI 28.75 kg/m   Constitutional:  Alert and oriented, no acute distress, nontoxic appearing HEENT: , AT Cardiovascular: No clubbing, cyanosis, or edema Respiratory: Normal respiratory effort, no increased work of breathing Skin: No rashes, bruises or suspicious lesions Neurologic: Grossly intact, no focal deficits, moving all 4 extremities Psychiatric: Normal mood and affect  Laboratory Data: Results for orders placed or performed in visit on 05/26/22  Microscopic Examination   Urine  Result Value Ref Range   WBC, UA 0-5 0 - 5 /hpf   RBC, Urine 0-2 0 - 2 /hpf   Epithelial Cells (non renal) 0-10 0 - 10 /hpf   Bacteria, UA None seen None seen/Few  Urinalysis, Complete  Result Value Ref Range   Specific Gravity, UA 1.015 1.005 - 1.030   pH, UA 6.5 5.0 - 7.5   Color, UA Yellow Yellow   Appearance Ur Clear Clear   Leukocytes,UA Negative Negative   Protein,UA Negative Negative/Trace   Glucose, UA Negative Negative   Ketones, UA Negative Negative   RBC, UA Negative Negative   Bilirubin, UA Negative Negative    Urobilinogen, Ur 0.2 0.2 - 1.0 mg/dL   Nitrite, UA Negative Negative   Microscopic Examination See below:   Bladder Scan (Post Void Residual) in office  Result Value Ref Range   Scan Result 40    Assessment & Plan:   1. Nocturia New nocturia without obstructive voiding symptoms over the past few weeks, which coincides with recent upper respiratory illness.  I suspect he may have a temporary exacerbation of borderline OSA associated with his recent illness which is inducing new nocturia and this may be time-limited.  Regardless, we will start him on Myrbetriq and plan for symptom recheck and PVR in 4 weeks.  He is emptying appropriately today. - Urinalysis, Complete - Bladder Scan (Post  Void Residual) in office - mirabegron ER (MYRBETRIQ) 25 MG TB24 tablet; Take 1 tablet (25 mg total) by mouth daily.  Dispense: 28 tablet; Refill: 0  2. History of nephrolithiasis With his history of nephrolithiasis, I would also like to get a KUB today to rule out acute stone episode that could be contributory to his new symptoms.  Will call with results. - DG Abd 1 View; Future  Return in about 4 weeks (around 06/23/2022) for Symptom recheck with PVR, Will call with results.  Debroah Loop, PA-C  [facility] [address] [place], [place] [address] [phone]

## 2022-05-28 NOTE — Telephone Encounter (Signed)
He has a small, nonobstructing stone at the top of his left kidney, however there is no evidence of an acute stone episode.  Overall this is great news.

## 2022-05-28 NOTE — Telephone Encounter (Signed)
Notified patient as instructed, patient pleased °

## 2022-05-28 NOTE — Telephone Encounter (Signed)
Pt called just wanted to know the results of the KUB.  Please call [phone]  Thank you

## 2022-06-03 ENCOUNTER — Ambulatory Visit: Payer: Managed Care, Other (non HMO) | Admitting: Physician Assistant

## 2022-06-03 ENCOUNTER — Encounter: Payer: Self-pay | Admitting: Physician Assistant

## 2022-06-03 VITALS — BP 134/90 | HR 70 | Ht 72.0 in | Wt 220.2 lb

## 2022-06-03 DIAGNOSIS — I10 Essential (primary) hypertension: Secondary | ICD-10-CM

## 2022-06-03 MED ORDER — LISINOPRIL 5 MG PO TABS: ORAL_TABLET | ORAL | 1 refills | Status: DC

## 2022-06-03 NOTE — Assessment & Plan Note (Addendum)
Chronic, moderately elevated in office but currently unmedicated Managed with lisinopril 5 mg , refilled  Advised he needs appt with pcp for a physical and labwork

## 2022-06-03 NOTE — Progress Notes (Signed)
I,Sha'taria Tyson,acting as a Education administrator for [employer], PA-C.,have documented all relevant documentation on the behalf of Mikey Kirschner, PA-C,as directed by  Mikey Kirschner, PA-C while in the presence of Mikey Kirschner, PA-C.   Established patient visit   Patient: Dustin Paul   DOB: [birth year]   62 y.o. Male  MRN: [id number] Visit Date: 06/03/2022  Today's healthcare provider: Mikey Kirschner, PA-C   Cc. Htn f/u  Subjective    HPI  Hypertension, follow-up  BP Readings from Last 3 Encounters:  06/03/22 (!) 134/90  05/26/22 (!) 150/87  03/11/22 (!) 136/109   Wt Readings from Last 3 Encounters:  06/03/22 220 lb 3.2 oz (99.9 kg)  05/26/22 212 lb (96.2 kg)  03/11/22 215 lb (97.5 kg)     He was last seen for hypertension 15 months ago.  BP at that visit was 134/89. Management since that visit includes continue current medications.  He reports excellent compliance with treatment. He is not having side effects.  He is following a Low Sodium diet. He is exercising. He does not smoke.  Use of agents associated with hypertension: decongestants.   Outside blood pressures are checked on occasions Symptoms: No chest pain No chest pressure  No palpitations No syncope  No dyspnea No orthopnea  No paroxysmal nocturnal dyspnea No lower extremity edema   Pertinent labs Lab Results  Component Value Date   CHOL 136 06/05/2021   HDL 35 (L) 06/05/2021   LDLCALC 80 06/05/2021   TRIG 113 06/05/2021   CHOLHDL 3.9 06/05/2021   Lab Results  Component Value Date   NA 140 06/05/2021   K 4.5 06/05/2021   CREATININE 1.39 (H) 06/05/2021   EGFR 58 (L) 06/05/2021   GLUCOSE 101 (H) 06/05/2021   TSH 2.430 06/25/2015     The 10-year ASCVD risk score (Arnett DK, et al., 2019) is: 10.8%  ---------------------------------------------------------------------------------------------------   Medications: Outpatient Medications Prior to Visit  Medication Sig   Armodafinil 250  MG tablet Take 250 mg by mouth daily.   famotidine-calcium carbonate-magnesium hydroxide (PEPCID COMPLETE) 10-800-165 MG chewable tablet Chew 1 tablet by mouth daily as needed.   fluticasone (FLONASE) 50 MCG/ACT nasal spray Place 2 sprays into both nostrils daily.   lamoTRIgine (LAMICTAL) 150 MG tablet Take 150 mg by mouth 2 (two) times daily.   Melatonin 10 MG CAPS Take 1 capsule by mouth at bedtime.   mirabegron ER (MYRBETRIQ) 25 MG TB24 tablet Take 1 tablet (25 mg total) by mouth daily.   mirtazapine (REMERON) 15 MG tablet Take 1 tablet (15 mg total) by mouth at bedtime.   QUDEXY XR 150 MG CS24 take 1 capsule by mouth daily at bedtime   RABEprazole (ACIPHEX) 20 MG tablet TAKE ONE TABLET EVERY DAY   tadalafil (CIALIS) 20 MG tablet TAKE 1 TABLET 1 HOUR PRIOR TO INTERCOURSE.   traZODone (DESYREL) 100 MG tablet Take 2 tablets (200 mg total) by mouth at bedtime.   [DISCONTINUED] lisinopril (ZESTRIL) 5 MG tablet TAKE ONE TABLET (5 MG) BY MOUTH EVERY DAY   benzonatate (TESSALON) 100 MG capsule Take 1 capsule (100 mg total) by mouth 3 (three) times daily as needed for cough. (Patient not taking: Reported on 06/03/2022)   No facility-administered medications prior to visit.    Review of Systems  Constitutional:  Negative for fatigue and fever.  Respiratory:  Positive for cough. Negative for shortness of breath.   Cardiovascular:  Negative for chest pain, palpitations and leg swelling.  Neurological:  Negative for dizziness and headaches.      Objective    Blood pressure (!) 134/90, pulse 70, height 6' (1.829 m), weight 220 lb 3.2 oz (99.9 kg), SpO2 100 %.   Physical Exam Constitutional:      General: He is awake.     Appearance: He is well-developed.  HENT:     Head: Normocephalic.  Eyes:     Conjunctiva/sclera: Conjunctivae normal.  Cardiovascular:     Rate and Rhythm: Normal rate and regular rhythm.     Heart sounds: Normal heart sounds.  Pulmonary:     Effort: Pulmonary effort is  normal.     Breath sounds: Normal breath sounds.  Skin:    General: Skin is warm.  Neurological:     Mental Status: He is alert and oriented to person, place, and time.  Psychiatric:        Attention and Perception: Attention normal.        Mood and Affect: Mood normal.        Speech: Speech normal.        Behavior: Behavior is cooperative.      No results found for any visits on 06/03/22.  Assessment & Plan     Problem List Items Addressed This Visit       Cardiovascular and Mediastinum   Essential (primary) hypertension - Primary    Chronic, moderately elevated in office but currently unmedicated Managed with lisinopril 5 mg , refilled  Advised he needs appt with pcp for a physical and labwork      Relevant Medications   lisinopril (ZESTRIL) 5 MG tablet     Return in about 3 months (around 09/02/2022) for CPE.      I,  , PA-C have reviewed all documentation for this visit. The documentation on  06/03/22  for the exam, diagnosis, procedures, and orders are all accurate and complete.   , PA-C [facility] [address] [place], [place], [address] Office: [phone] Fax: [phone]   [facility]  

## 2022-06-09 ENCOUNTER — Ambulatory Visit: Payer: BC Managed Care – PPO | Admitting: Urology

## 2022-06-23 ENCOUNTER — Ambulatory Visit: Payer: Managed Care, Other (non HMO) | Admitting: Physician Assistant

## 2022-06-30 ENCOUNTER — Other Ambulatory Visit: Payer: Self-pay | Admitting: Psychiatry

## 2022-06-30 DIAGNOSIS — F5105 Insomnia due to other mental disorder: Secondary | ICD-10-CM

## 2022-07-08 ENCOUNTER — Telehealth: Payer: Self-pay | Admitting: Physician Assistant

## 2022-07-08 DIAGNOSIS — R351 Nocturia: Secondary | ICD-10-CM

## 2022-07-08 MED ORDER — MIRABEGRON ER 25 MG PO TB24: 25.0000 mg | ORAL_TABLET | Freq: Every day | ORAL | 11 refills | Status: DC

## 2022-07-08 NOTE — Telephone Encounter (Signed)
Patient notified RX sent to pharmacy.

## 2022-07-08 NOTE — Telephone Encounter (Signed)
Pt said Myrbetriq 25 mg is working well for him and he would like to have a new RX sent to [facility].

## 2022-07-13 ENCOUNTER — Ambulatory Visit: Payer: Managed Care, Other (non HMO) | Admitting: Physician Assistant

## 2022-07-21 ENCOUNTER — Telehealth: Payer: Self-pay | Admitting: Family Medicine

## 2022-07-21 DIAGNOSIS — N200 Calculus of kidney: Secondary | ICD-10-CM

## 2022-07-21 DIAGNOSIS — Z960 Presence of urogenital implants: Secondary | ICD-10-CM

## 2022-07-21 NOTE — Telephone Encounter (Signed)
Patient notified Myrbetriq has been denied by insurance. He will need to try and fail Oxybutynin, Solifenacin, Tolderodine, Trospium, Darifenacin Is there another medication you would like him to try?

## 2022-07-23 NOTE — Progress Notes (Unsigned)
Ar[place] P[place],acting as a scribe for Lelon Huh, MD.,have documented all relevant documentation on the behalf of Lelon Huh, MD,as directed by  Lelon Huh, MD while in the presence of Lelon Huh, MD.     Complete physical exam   Patient: Dustin Paul   DOB: 1961/01/31   62 y.o. Male  MRN: [id number] Visit Date: 07/26/2022  Today's healthcare provider: Lelon Huh, MD   No chief complaint on file.  Subjective    Dustin Paul is a 62 y.o. male who presents today for a complete physical exam.  He reports consuming a {diet types:17450} diet. {Exercise:19826} He generally feels {well/fairly well/poorly:18703}. He reports sleeping {well/fairly well/poorly:18703}. He {does/does not:200015} have additional problems to discuss today.  HPI  ***  Past Medical History:  Diagnosis Date   Aneurysm of artery ([place]) 2011   LEFT (19 MM) AND RIGHT (11 MM) middle cerebral   Chronic kidney disease    H/O STONES   Complication of anesthesia    Headache    MIGRAINES   Insomnia    Lumbar stress fracture    PONV (postoperative nausea and vomiting)    AFTER KNEE SURGERY IN 1980'S   Seasonal allergies    Seizures ([place])    last seizure in 2015   Visual hallucinations    Past Surgical History:  Procedure Laterality Date   BRAIN SURGERY  2011   2 ANEURYSMS-TOTAL OF 9 CLIPS IN BRAIN FROM ANEURYSM   COLONOSCOPY WITH PROPOFOL N/A 01/16/2015   Procedure: COLONOSCOPY WITH PROPOFOL;  Surgeon: Hulen Luster, MD;  [facility] ENDOSCOPY;  Service: Gastroenterology;  Laterality: N/A;   ESOPHAGOGASTRODUODENOSCOPY (EGD) WITH PROPOFOL N/A 12/05/2018   Procedure: ESOPHAGOGASTRODUODENOSCOPY (EGD) WITH PROPOFOL;  Surgeon: Toledo, Benay Pike, MD;  Location: [facility] ENDOSCOPY;  Service: Gastroenterology;  Laterality: N/A;   KNEE ARTHROSCOPY WITH MEDIAL MENISECTOMY Left 11/20/2015   Procedure: KNEE ARTHROSCOPY WITH partial MEDIAL MENISECTOMY / WITH REMOVAL OF LOOSE BODY;  Surgeon: Hessie Knows,  MD;  Location: [facility] ORS;  Service: Orthopedics;  Laterality: Left;   KNEE SURGERY Left    Social History   Socioeconomic History   Marital status: Married    Spouse name: Not on file   Number of children: 2   Years of education: Not on file   Highest education level: Bachelor's degree (e.g., BA, AB, BS)  Occupational History   Occupation: engineer/retired  Tobacco Use   Smoking status: Never   Smokeless tobacco: Never  Vaping Use   Vaping Use: Never used  Substance and Sexual Activity   Alcohol use: No    Alcohol/week: 0.0 standard drinks of alcohol    Comment: 1-2 drinks a month   Drug use: No   Sexual activity: Not Currently  Other Topics Concern   Not on file  Social History Narrative   Not on file   Social Determinants of Health   Financial Resource Strain: Low Risk  (08/16/2017)   Overall Financial Resource Strain (CARDIA)    Difficulty of Paying Living Expenses: Not hard at all  Food Insecurity: No Food Insecurity (08/16/2017)   Hunger Vital Sign    Worried About Running Out of Food in the Last Year: Never true    Ran Out of Food in the Last Year: Never true  Transportation Needs: No Transportation Needs (08/16/2017)   PRAPARE - Hydrologist (Medical): No    Lack of Transportation (Non-Medical): No  Physical Activity: Not on file  Stress: No Stress Concern  Present (08/16/2017)   [place]    Feeling of Stress : Not at all  Social Connections: Not on file  Intimate Partner Violence: Not on file   Family Status  Relation Name Status   Mother  Alive   Father  Alive       does not have contact with    Sister  Deceased at age 41       MVA   [place]  (Not Specified)   Family History  Problem Relation Age of Onset   Depression Maternal Aunt    Allergies  Allergen Reactions   Levetiracetam Other (See Comments)    Racing thoughts per [facility] neurology note  03-11-11 Other reaction(s): Other (See Comments) Racing thoughts   Aspirin     Nose bleed, thin blood    Patient Care Team: Birdie Sons, MD as PCP - General (Family Medicine) Agapito Games as Consulting Physician (Optometry) Lavona Mound, Gerri Lins, MD as Referring Physician (Psychiatry) Wyvonne Lenz, DO as Referring Physician (Neurology) Rainey Pines, MD as Consulting Physician (Psychiatry) Efrain Sella, MD as Consulting Physician (Gastroenterology) Abbie Sons, MD (Urology)   Medications: Outpatient Medications Prior to Visit  Medication Sig   Armodafinil 250 MG tablet Take 250 mg by mouth daily.   benzonatate (TESSALON) 100 MG capsule Take 1 capsule (100 mg total) by mouth 3 (three) times daily as needed for cough. (Patient not taking: Reported on 06/03/2022)   famotidine-calcium carbonate-magnesium hydroxide (PEPCID COMPLETE) 10-800-165 MG chewable tablet Chew 1 tablet by mouth daily as needed.   fluticasone (FLONASE) 50 MCG/ACT nasal spray Place 2 sprays into both nostrils daily.   lamoTRIgine (LAMICTAL) 150 MG tablet Take 150 mg by mouth 2 (two) times daily.   lisinopril (ZESTRIL) 5 MG tablet TAKE ONE TABLET (5 MG) BY MOUTH EVERY DAY   Melatonin 10 MG CAPS Take 1 capsule by mouth at bedtime.   mirabegron ER (MYRBETRIQ) 25 MG TB24 tablet Take 1 tablet (25 mg total) by mouth daily.   mirtazapine (REMERON) 15 MG tablet Take 1 tablet (15 mg total) by mouth at bedtime.   QUDEXY XR 150 MG CS24 take 1 capsule by mouth daily at bedtime   RABEprazole (ACIPHEX) 20 MG tablet TAKE ONE TABLET EVERY DAY   tadalafil (CIALIS) 20 MG tablet TAKE 1 TABLET 1 HOUR PRIOR TO INTERCOURSE.   traZODone (DESYREL) 100 MG tablet Take 2 tablets (200 mg total) by mouth at bedtime.   No facility-administered medications prior to visit.    Review of Systems  Constitutional: Negative.   HENT: Negative.    Eyes: Negative.   Respiratory: Negative.    Cardiovascular: Negative.    Gastrointestinal: Negative.   Endocrine: Negative.   Genitourinary: Negative.   Musculoskeletal: Negative.   Skin: Negative.   Allergic/Immunologic: Negative.   Neurological: Negative.   Hematological: Negative.   Psychiatric/Behavioral: Negative.      {Labs  Heme  Chem  Endocrine  Serology  Results Review (optional):23779}  Objective    There were no vitals taken for this visit. {Show previous vital signs (optional):23777}   Physical Exam  ***  Last depression screening scores    03/11/2022    8:28 AM 03/24/2021    9:20 AM 01/06/2021   10:24 AM  PHQ 2/9 Scores  PHQ - 2 Score 0 0   PHQ- 9 Score 7 1      Information is confidential and restricted. Go to Review Flowsheets to  unlock data.   Last fall risk screening    03/11/2022    8:28 AM  [place] in the past year? 0  Number falls in past yr: 0  Injury with Fall? 0  Risk for fall due to : No Fall Risks  Follow up Falls evaluation completed   Last Audit-C alcohol use screening    03/11/2022    8:28 AM  Alcohol Use Disorder Test (AUDIT)  1. How often do you have a drink containing alcohol? 2  2. How many drinks containing alcohol do you have on a typical day when you are drinking? 0  3. How often do you have six or more drinks on one occasion? 0  AUDIT-C Score 2   A score of 3 or more in women, and 4 or more in men indicates increased risk for alcohol abuse, EXCEPT if all of the points are from question 1   No results found for any visits on 07/26/22.  Assessment & Plan    Routine Health Maintenance and Physical Exam  Exercise Activities and Dietary recommendations  Goals   None     Immunization History  Administered Date(s) Administered   Influenza Inj Mdck Quad Pf 03/14/2019   Influenza,inj,Quad PF,6+ Mos 02/15/2020   Moderna Sars-Covid-2 Vaccination 07/16/2019, 08/13/2019   Td 10/12/1996   Tdap 07/12/2007, 02/13/2019    Health Maintenance  Topic Date Due   HIV Screening   Never done   Zoster Vaccines- Shingrix (1 of 2) Never done   COLONOSCOPY (Pts 45-62yr Insurance coverage will need to be confirmed)  01/16/2020   COVID-19 Vaccine (3 - 2023-24 season) 01/29/2022   INFLUENZA VACCINE  08/29/2022 (Originally 12/29/2021)   DTaP/Tdap/Td (4 - Td or Tdap) 02/12/2029   Hepatitis C Screening  Completed   HPV VACCINES  Aged Out    Discussed health benefits of physical activity, and encouraged him to engage in regular exercise appropriate for his age and condition.  ***  No follow-ups on file.     {provider attestation***:1}   DLelon Huh MD  [phone](phone) [phone](fax)  [place]

## 2022-07-23 NOTE — Progress Notes (Unsigned)
BH MD/PA/NP OP Progress Note  07/28/2022 8:19 AM Dustin Paul  MRN:  [id number]  Chief Complaint:  Chief Complaint  Patient presents with   Follow-up   HPI:  This is a follow-up appointment for depression and insomnia.  He states that he has been doing good.  His mother in [place] may have surgery for uterine prolapse.  His mother-in-law is doing well considering her age.  They are planning to go to the beach together.  Has been busy at work.  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  He had a good time with his daughter and Christmas.  He sleeps well.  He denies feeling depressed or anxiety.  He denies change in appetite.  He denies SI.  He drinks fireball twice a week.  He denies drug use.  He feels comfortable to stay on the current medication regimen.    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  Support: wife, youngest daughter Household: wife Marital status: married since 08-28-2016, his former wife died in 29-Aug-2011 from breast cancer,  Number of children: 2. (in [place], [place]), he reports a strange relationship with his 62 yo oldest daughter   Visit Diagnosis:    ICD-10-CM   1. MDD (major depressive disorder), recurrent, in full remission ([place])  F33.42     2. Insomnia due to mental condition  F51.05 traZODone (DESYREL) 100 MG tablet      Past Psychiatric History: Please see initial evaluation for full details. I have reviewed the history. No updates at this time.     Past Medical History:  Past Medical History:  Diagnosis Date   Aneurysm of artery ([place]) 08-28-09   LEFT (19 MM) AND RIGHT (11 MM) middle cerebral   Chronic kidney disease    H/O STONES   Complication of anesthesia    Headache    MIGRAINES   Insomnia    Lumbar stress fracture    PONV  (postoperative nausea and vomiting)    AFTER KNEE SURGERY IN 1980'S   Seasonal allergies    Seizures ([place])    last seizure in 2013-08-28   Visual hallucinations     Past Surgical History:  Procedure Laterality Date   BRAIN SURGERY  28-Aug-2009   2 ANEURYSMS-TOTAL OF 9 CLIPS IN BRAIN FROM ANEURYSM   COLONOSCOPY WITH PROPOFOL N/A 01/16/2015   Procedure: COLONOSCOPY WITH PROPOFOL;  Surgeon: Hulen Luster, MD;  Location: [facility] ENDOSCOPY;  Service: Gastroenterology;  Laterality: N/A;   ESOPHAGOGASTRODUODENOSCOPY (EGD) WITH PROPOFOL N/A 12/05/2018   Procedure: ESOPHAGOGASTRODUODENOSCOPY (EGD) WITH PROPOFOL;  Surgeon: Toledo, Benay Pike, MD;  Location: [facility] ENDOSCOPY;  Service: Gastroenterology;  Laterality: N/A;   KNEE ARTHROSCOPY WITH MEDIAL MENISECTOMY Left 11/20/2015   Procedure: KNEE ARTHROSCOPY WITH partial MEDIAL MENISECTOMY / WITH REMOVAL OF LOOSE BODY;  Surgeon: Hessie Knows, MD;  Location: [facility] ORS;  Service: Orthopedics;  Laterality: Left;   KNEE SURGERY Left     Family Psychiatric History: Please see initial evaluation for full details. I have reviewed the history. No updates at this time.     Family History:  Family History  Problem Relation Age of Onset   Depression Maternal Aunt     Social History:  Social History   Socioeconomic History   Marital status: Married  Spouse name: Not on file   Number of children: 2   Years of education: Not on file   Highest education level: Bachelor's degree (e.g., BA, AB, BS)  Occupational History   Occupation: engineer/retired  Tobacco Use   Smoking status: Never   Smokeless tobacco: Never  Vaping Use   Vaping Use: Never used  Substance and Sexual Activity   Alcohol use: No    Alcohol/week: 0.0 standard drinks of alcohol    Comment: 1-2 drinks a month   Drug use: No   Sexual activity: Not Currently  Other Topics Concern   Not on file  Social History Narrative   Not on file   Social Determinants of Health   Financial Resource Strain: Low  Risk  (08/16/2017)   Overall Financial Resource Strain (CARDIA)    Difficulty of Paying Living Expenses: Not hard at all  Food Insecurity: No Food Insecurity (08/16/2017)   Hunger Vital Sign    Worried About Running Out of Food in the Last Year: Never true    Ran Out of Food in the Last Year: Never true  Transportation Needs: No Transportation Needs (08/16/2017)   PRAPARE - Hydrologist (Medical): No    Lack of Transportation (Non-Medical): No  Physical Activity: Not on file  Stress: No Stress Concern Present (08/16/2017)   [place]    Feeling of Stress : Not at all  Social Connections: Not on file    Allergies:  Allergies  Allergen Reactions   Levetiracetam Other (See Comments)    Racing thoughts per [facility] neurology note 03-11-11 Other reaction(s): Other (See Comments) Racing thoughts   Aspirin     Nose bleed, thin blood    Metabolic Disorder Labs: No results found for: "HGBA1C", "MPG" Lab Results  Component Value Date   PROLACTIN 18.9 (H) 06/25/2015   Lab Results  Component Value Date   CHOL 136 07/27/2022   TRIG 150 (H) 07/27/2022   HDL 33 (L) 07/27/2022   CHOLHDL 3.9 06/05/2021   LDLCALC 77 07/27/2022   LDLCALC 80 06/05/2021   Lab Results  Component Value Date   TSH 1.740 07/27/2022   TSH 2.430 06/25/2015    Therapeutic Level Labs: No results found for: "LITHIUM" No results found for: "VALPROATE" No results found for: "CBMZ"  Current Medications: Current Outpatient Medications  Medication Sig Dispense Refill   famotidine-calcium carbonate-magnesium hydroxide (PEPCID COMPLETE) [phone] MG chewable tablet Chew 1 tablet by mouth daily as needed.     fluticasone (FLONASE) 50 MCG/ACT nasal spray Place 2 sprays into both nostrils daily. 16 g 6   lamoTRIgine (LAMICTAL) 150 MG tablet Take 150 mg by mouth 2 (two) times daily.     lisinopril (ZESTRIL) 5 MG tablet TAKE  ONE TABLET (5 MG) BY MOUTH EVERY DAY 90 tablet 1   Melatonin 10 MG CAPS Take 1 capsule by mouth at bedtime.     [START ON 08/28/2022] mirtazapine (REMERON) 15 MG tablet Take 1 tablet (15 mg total) by mouth at bedtime. 30 tablet 2   QUDEXY XR 150 MG CS24 take 1 capsule by mouth daily at bedtime  11   RABEprazole (ACIPHEX) 20 MG tablet TAKE ONE TABLET EVERY DAY 90 tablet 4   solifenacin (VESICARE) 10 MG tablet Take 1 tablet (10 mg total) by mouth daily. 7 tablet 0   tadalafil (CIALIS) 20 MG tablet TAKE 1 TABLET 1 HOUR PRIOR TO INTERCOURSE. 30 tablet 6   [  START ON 08/06/2022] traZODone (DESYREL) 100 MG tablet Take 2 tablets (200 mg total) by mouth at bedtime. 60 tablet 3   No current facility-administered medications for this visit.     Musculoskeletal: Strength & Muscle Tone: within normal limits Gait & Station: normal Patient leans: N/A  Psychiatric Specialty Exam: Review of Systems  Psychiatric/Behavioral:  Negative for agitation, behavioral problems, confusion, decreased concentration, dysphoric mood, hallucinations, self-injury, sleep disturbance and suicidal ideas. The patient is not nervous/anxious and is not hyperactive.   All other systems reviewed and are negative.   There were no vitals taken for this visit.There is no height or weight on file to calculate BMI.  General Appearance: Fairly Groomed  Eye Contact:  Good  Speech:  Clear and Coherent  Volume:  Normal  Mood:   good  Affect:  Appropriate, Congruent, and Full Range  Thought Process:  Coherent  Orientation:  Full (Time, Place, and Person)  Thought Content: Logical   Suicidal Thoughts:  No  Homicidal Thoughts:  No  Memory:  Immediate;   Good  Judgement:  Good  Insight:  Good  Psychomotor Activity:  Normal  Concentration:  Concentration: Good and Attention Span: Good  Recall:  Good  Fund of Knowledge: Good  Language: Good  Akathisia:  No  Handed:  Right  AIMS (if indicated): not done  Assets:  Communication  Skills Desire for Improvement  ADL's:  Intact  Cognition: WNL  Sleep:  Good   Screenings: [place] Office Visit from 07/12/2016 in [place] Total Score 0      PHQ2-9    [place] Office Visit from 07/26/2022 in [place] Office Visit from 03/11/2022 in [place] Office Visit from 03/24/2021 in [place] Video Visit from 01/06/2021 in [place] Video Visit from 11/27/2020 in [facility]  PHQ-2 Total Score 0 0 0 0 0  PHQ-9 Total Score 0 7 1 -- --      Flowsheet Row ED from 03/05/2022 in [facility]  Video Visit from 01/06/2021 in [place] Video Visit from 11/27/2020 in [place] No Risk No Risk No Risk        Assessment and Plan:  Dustin Paul is a 62 y.o. year old male with a history of episodic mood disorder, insomnia, CKD, migraine, cerebral aneurysm, hx of bilateral MCA aneurysms s/p clipping on the left, who presents for follow up appointment for below.   1. MDD (major depressive disorder), recurrent, in full remission ([place]) Acute stressors include: work related stress  Other stressors include:  mother in [place], taking care of her step mother, estranged relationship with one of his daughter   History: episodes of depression since brain surgery 10 years ago. Relapsed in the context of tapering off mirtazapine  He denies any significant mood symptoms since the last visit.  Will continue current dose of mirtazapine as maintenance treatment for depression.  Noted that although he denies any change in appetite, he has had weight gain according to the chart.  Will continue to monitor this.   2. Insomnia due to mental condition Improving.   He denies any drowsiness or fall.  Will continue trazodone as needed for insomnia.    Plan (according to the pharmacy, the patient can fill medication  only monthly due to insurance) 1. Continue mirtazapine 15 mg at night  2. Continue Trazodone 200 mg at night as needed for insomnia 3. Next appointment-  6/19 at 8 AM, video - on lamotrigine 150 mg twice a day for seizure. He states that this medication is prescribed by his neurologist - on flexeril, armodafinil prn     The patient demonstrates the following risk factors for suicide: Chronic risk factors for suicide include: psychiatric disorder of depression . Acute risk factors for suicide include: N/A. Protective factors for this patient include: positive social support, coping skills, and hope for the future. Considering these factors, the overall suicide risk at this point appears to be low. Patient is appropriate for outpatient follow up.        Collaboration of Care: Collaboration of Care: Other reviewed notes in Epic  Patient/Guardian was advised Release of Information must be obtained prior to any record release in order to collaborate their care with an outside provider. Patient/Guardian was advised if they have not already done so to contact the registration department to sign all necessary forms in order for [place] to release information regarding their care.   Consent: Patient/Guardian gives verbal consent for treatment and assignment of benefits for services provided during this visit. Patient/Guardian expressed understanding and agreed to proceed.    [name], MD 07/28/2022, 8:19 AM

## 2022-07-26 ENCOUNTER — Ambulatory Visit (INDEPENDENT_AMBULATORY_CARE_PROVIDER_SITE_OTHER): Payer: Managed Care, Other (non HMO) | Admitting: Family Medicine

## 2022-07-26 ENCOUNTER — Ambulatory Visit: Payer: Managed Care, Other (non HMO) | Admitting: Physician Assistant

## 2022-07-26 VITALS — BP 116/80 | HR 77 | Temp 98.2°F | Ht 72.0 in | Wt 223.0 lb

## 2022-07-26 DIAGNOSIS — Z0000 Encounter for general adult medical examination without abnormal findings: Secondary | ICD-10-CM | POA: Diagnosis not present

## 2022-07-26 DIAGNOSIS — Z86010 Personal history of colonic polyps: Secondary | ICD-10-CM | POA: Diagnosis not present

## 2022-07-26 DIAGNOSIS — Z860101 Personal history of adenomatous and serrated colon polyps: Secondary | ICD-10-CM

## 2022-07-26 DIAGNOSIS — G40909 Epilepsy, unspecified, not intractable, without status epilepticus: Secondary | ICD-10-CM

## 2022-07-26 DIAGNOSIS — Z1211 Encounter for screening for malignant neoplasm of colon: Secondary | ICD-10-CM

## 2022-07-26 DIAGNOSIS — Z125 Encounter for screening for malignant neoplasm of prostate: Secondary | ICD-10-CM

## 2022-07-26 DIAGNOSIS — I10 Essential (primary) hypertension: Secondary | ICD-10-CM | POA: Diagnosis not present

## 2022-07-26 NOTE — Patient Instructions (Signed)
Please review the attached list of medications and notify my office if there are any errors.   The CDC recommends two doses of Shingrix (the shingles vaccine) separated by 2 to 6 months for adults age 62 years and older. I recommend checking with your pharmacy plan regarding coverage for this vaccine.   It is recommended to engage in 150 minutes of moderate exercise every week.

## 2022-07-26 NOTE — Telephone Encounter (Signed)
Patient calls triage line and states that he is having flank pain, it appears that he needs medication sent in for bladder spasm can you advise on what to send in. Pt is scheduled for stent removal with you this coming Friday 07/30/22.

## 2022-07-27 ENCOUNTER — Telehealth: Payer: Self-pay

## 2022-07-27 MED ORDER — SOLIFENACIN SUCCINATE 10 MG PO TABS: 10.0000 mg | ORAL_TABLET | Freq: Every day | ORAL | 0 refills | Status: AC

## 2022-07-27 NOTE — Addendum Note (Signed)
Addended by: Kris Mouton on: 07/27/2022 02:02 PM   Modules accepted: Orders

## 2022-07-27 NOTE — Telephone Encounter (Signed)
Solifenacin 10 mg daily #7.  Also please order a KUB prior to stent removal on Friday

## 2022-07-27 NOTE — Telephone Encounter (Signed)
Patient advised. RX phones in to [facility]. KUB placed and patient advised to get this done about an hour prior to his appointment on 07/30/22.

## 2022-07-27 NOTE — Telephone Encounter (Signed)
Patient has been contacted to schedule his colonoscopy.  He is not ready to schedule at this time, due to other upcoming medical appts.  He asked to call us back later to schedule.  Informed him that this is fine-we will send a referral reminder out to him and he can call back to schedule when he is ready.  Thanks,  [place], [place]

## 2022-07-27 NOTE — Addendum Note (Signed)
Addended by: Abbie Sons on: 07/27/2022 07:27 AM   Modules accepted: Orders

## 2022-07-27 NOTE — Addendum Note (Signed)
Addended by: Kris Mouton on: 07/27/2022 03:13 PM   Modules accepted: Orders

## 2022-07-28 ENCOUNTER — Telehealth (INDEPENDENT_AMBULATORY_CARE_PROVIDER_SITE_OTHER): Payer: 59 | Admitting: Psychiatry

## 2022-07-28 ENCOUNTER — Encounter: Payer: Self-pay | Admitting: Psychiatry

## 2022-07-28 DIAGNOSIS — F3342 Major depressive disorder, recurrent, in full remission: Secondary | ICD-10-CM

## 2022-07-28 DIAGNOSIS — F5105 Insomnia due to other mental disorder: Secondary | ICD-10-CM | POA: Diagnosis not present

## 2022-07-28 LAB — COMPREHENSIVE METABOLIC PANEL
ALT: 22 IU/L (ref 0–44)
AST: 25 IU/L (ref 0–40)
Albumin/Globulin Ratio: 2.3 — ABNORMAL HIGH (ref 1.2–2.2)
Albumin: 4.3 g/dL (ref 3.9–4.9)
Alkaline Phosphatase: 46 IU/L (ref 44–121)
BUN/Creatinine Ratio: 10 (ref 10–24)
BUN: 15 mg/dL (ref 8–27)
Bilirubin Total: 0.2 mg/dL (ref 0.0–1.2)
CO2: 22 mmol/L (ref 20–29)
Calcium: 9.2 mg/dL (ref 8.6–10.2)
Chloride: 104 mmol/L (ref 96–106)
Creatinine, Ser: 1.43 mg/dL — ABNORMAL HIGH (ref 0.76–1.27)
Globulin, Total: 1.9 g/dL (ref 1.5–4.5)
Glucose: 100 mg/dL — ABNORMAL HIGH (ref 70–99)
Potassium: 4.7 mmol/L (ref 3.5–5.2)
Sodium: 139 mmol/L (ref 134–144)
Total Protein: 6.2 g/dL (ref 6.0–8.5)
eGFR: 56 mL/min/{1.73_m2} — ABNORMAL LOW (ref >59)

## 2022-07-28 LAB — CBC WITH DIFFERENTIAL/PLATELET
Basophils Absolute: 0 10*3/uL (ref 0.0–0.2)
Basos: 1 %
EOS (ABSOLUTE): 0.2 10*3/uL (ref 0.0–0.4)
Eos: 4 %
Hematocrit: 43.3 % (ref 37.5–51.0)
Hemoglobin: 14.6 g/dL (ref 13.0–17.7)
Immature Grans (Abs): 0 10*3/uL (ref 0.0–0.1)
Immature Granulocytes: 0 %
Lymphocytes Absolute: 1.9 10*3/uL (ref 0.7–3.1)
Lymphs: 30 %
MCH: 30.2 pg (ref 26.6–33.0)
MCHC: 33.7 g/dL (ref 31.5–35.7)
MCV: 90 fL (ref 79–97)
Monocytes Absolute: 0.5 10*3/uL (ref 0.1–0.9)
Monocytes: 8 %
Neutrophils Absolute: 3.6 10*3/uL (ref 1.4–7.0)
Neutrophils: 57 %
Platelets: 203 10*3/uL (ref 150–450)
RBC: 4.84 x10E6/uL (ref 4.14–5.80)
RDW: 13.5 % (ref 11.6–15.4)
WBC: 6.2 10*3/uL (ref 3.4–10.8)

## 2022-07-28 LAB — MAGNESIUM: Magnesium: 2.2 mg/dL (ref 1.6–2.3)

## 2022-07-28 LAB — LIPID PANEL WITH LDL/HDL RATIO
Cholesterol, Total: 136 mg/dL (ref 100–199)
HDL: 33 mg/dL — ABNORMAL LOW (ref >39)
LDL Chol Calc (NIH): 77 mg/dL (ref 0–99)
LDL/HDL Ratio: 2.3 ratio (ref 0.0–3.6)
Triglycerides: 150 mg/dL — ABNORMAL HIGH (ref 0–149)
VLDL Cholesterol Cal: 26 mg/dL (ref 5–40)

## 2022-07-28 LAB — TSH: TSH: 1.74 u[IU]/mL (ref 0.450–4.500)

## 2022-07-28 LAB — PSA: Prostate Specific Ag, Serum: 3.5 ng/mL (ref 0.0–4.0)

## 2022-07-28 MED ORDER — TRAZODONE HCL 100 MG PO TABS: 200.0000 mg | ORAL_TABLET | Freq: Every day | ORAL | 3 refills | Status: DC

## 2022-07-28 MED ORDER — MIRTAZAPINE 15 MG PO TABS: 15.0000 mg | ORAL_TABLET | Freq: Every day | ORAL | 2 refills | Status: DC

## 2022-07-29 ENCOUNTER — Encounter: Payer: Self-pay | Admitting: Urology

## 2022-07-29 ENCOUNTER — Ambulatory Visit
Admission: RE | Admit: 2022-07-29 | Discharge: 2022-07-29 | Disposition: A | Discharge status: Home / Self Care | Payer: Managed Care, Other (non HMO) | Source: Ambulatory Visit | Attending: Urology | Admitting: Urology

## 2022-07-29 ENCOUNTER — Ambulatory Visit
Admission: RE | Admit: 2022-07-29 | Discharge: 2022-07-29 | Disposition: A | Discharge status: Home / Self Care | Payer: Managed Care, Other (non HMO) | Attending: Urology | Admitting: Urology

## 2022-07-29 DIAGNOSIS — Z960 Presence of urogenital implants: Secondary | ICD-10-CM | POA: Insufficient documentation

## 2022-07-29 DIAGNOSIS — N200 Calculus of kidney: Secondary | ICD-10-CM | POA: Diagnosis present

## 2022-07-30 ENCOUNTER — Encounter: Payer: Self-pay | Admitting: Urology

## 2022-07-30 ENCOUNTER — Other Ambulatory Visit: Payer: Self-pay | Admitting: Urology

## 2022-07-30 ENCOUNTER — Ambulatory Visit (INDEPENDENT_AMBULATORY_CARE_PROVIDER_SITE_OTHER): Payer: Managed Care, Other (non HMO) | Admitting: Urology

## 2022-07-30 VITALS — BP 155/96 | HR 84 | Ht 72.0 in | Wt 221.0 lb

## 2022-07-30 DIAGNOSIS — Z466 Encounter for fitting and adjustment of urinary device: Secondary | ICD-10-CM | POA: Diagnosis not present

## 2022-07-30 DIAGNOSIS — N200 Calculus of kidney: Secondary | ICD-10-CM

## 2022-07-30 LAB — MICROSCOPIC EXAMINATION: RBC, Urine: >30 /hpf — AB (ref 0–2)

## 2022-07-30 LAB — URINALYSIS, COMPLETE
Bilirubin, UA: NEGATIVE
Glucose, UA: NEGATIVE
Ketones, UA: NEGATIVE
Nitrite, UA: NEGATIVE
Specific Gravity, UA: >1.03 — ABNORMAL HIGH (ref 1.005–1.030)
Urobilinogen, Ur: 0.2 mg/dL (ref 0.2–1.0)
pH, UA: 5.5 (ref 5.0–7.5)

## 2022-07-30 MED ORDER — OXYCODONE-ACETAMINOPHEN 5-325 MG PO TABS: 1.0000 | ORAL_TABLET | Freq: Four times a day (QID) | ORAL | 0 refills | Status: DC | PRN

## 2022-07-30 MED ORDER — CIPROFLOXACIN HCL 500 MG PO TABS
500.0000 mg | ORAL_TABLET | Freq: Once | ORAL | Status: AC
  Administered 2022-07-30: 500 mg via ORAL

## 2022-07-30 NOTE — Progress Notes (Signed)
Patient contacted office complaining of flank pain post stent removal.  Rx oxycodone sent to pharmacy.  Instructed to proceed to ED for worsening pain or development of fever

## 2022-07-30 NOTE — Progress Notes (Signed)
   Indications: Patient is 62 y.o., who is s/p ureteroscopic removal of a left ureteral calculus 07/13/2022 while traveling in [place].  He developed severe renal colic and has been followed here for nonobstructing left renal calculus.  The patient is presenting today for stent removal.  KUB performed this morning was reviewed.  Stent is in good position.  No stone fragments are identified alongside the ureter and the previously seen left renal calculus is not visualized.  Procedure:  Flexible Cystoscopy with stent removal [id number])  Timeout was performed and the correct patient, procedure and participants were identified.    Description:  The patient was prepped and draped in the usual sterile fashion. Flexible cystosopy was performed.  The stent was visualized, grasped, and removed intact without difficulty. The patient tolerated the procedure well.  A single dose of oral antibiotics was given.  Complications:  None  Plan:  I do not have the op note however patient states he was told the calculus was "embedded in the ureter" Schedule follow-up renal ultrasound 6-8 weeks  John Giovanni, MD

## 2022-08-19 ENCOUNTER — Other Ambulatory Visit: Payer: Self-pay | Admitting: Family Medicine

## 2022-08-19 DIAGNOSIS — K219 Gastro-esophageal reflux disease without esophagitis: Secondary | ICD-10-CM

## 2022-08-29 ENCOUNTER — Emergency Department
Admission: EM | Admit: 2022-08-29 | Discharge: 2022-08-29 | Disposition: A | Discharge status: Home / Self Care | Payer: Managed Care, Other (non HMO) | Attending: Emergency Medicine | Admitting: Emergency Medicine

## 2022-08-29 ENCOUNTER — Emergency Department: Payer: Managed Care, Other (non HMO)

## 2022-08-29 ENCOUNTER — Other Ambulatory Visit: Payer: Self-pay

## 2022-08-29 DIAGNOSIS — N200 Calculus of kidney: Secondary | ICD-10-CM | POA: Insufficient documentation

## 2022-08-29 DIAGNOSIS — R1110 Vomiting, unspecified: Secondary | ICD-10-CM | POA: Diagnosis present

## 2022-08-29 DIAGNOSIS — N201 Calculus of ureter: Secondary | ICD-10-CM | POA: Diagnosis not present

## 2022-08-29 DIAGNOSIS — N23 Unspecified renal colic: Secondary | ICD-10-CM

## 2022-08-29 LAB — LIPASE, BLOOD: Lipase: 27 U/L (ref 11–51)

## 2022-08-29 LAB — COMPREHENSIVE METABOLIC PANEL
ALT: 21 U/L (ref 0–44)
AST: 28 U/L (ref 15–41)
Albumin: 4.1 g/dL (ref 3.5–5.0)
Alkaline Phosphatase: 37 U/L — ABNORMAL LOW (ref 38–126)
Anion gap: 9 (ref 5–15)
BUN: 18 mg/dL (ref 8–23)
CO2: 23 mmol/L (ref 22–32)
Calcium: 9.1 mg/dL (ref 8.9–10.3)
Chloride: 105 mmol/L (ref 98–111)
Creatinine, Ser: 1.59 mg/dL — ABNORMAL HIGH (ref 0.61–1.24)
GFR, Estimated: 49 mL/min — ABNORMAL LOW (ref >60)
Glucose, Bld: 153 mg/dL — ABNORMAL HIGH (ref 70–99)
Potassium: 3.8 mmol/L (ref 3.5–5.1)
Sodium: 137 mmol/L (ref 135–145)
Total Bilirubin: 0.6 mg/dL (ref 0.3–1.2)
Total Protein: 6.9 g/dL (ref 6.5–8.1)

## 2022-08-29 LAB — CBC
HCT: 41.5 % (ref 39.0–52.0)
Hemoglobin: 14 g/dL (ref 13.0–17.0)
MCH: 30.4 pg (ref 26.0–34.0)
MCHC: 33.7 g/dL (ref 30.0–36.0)
MCV: 90.2 fL (ref 80.0–100.0)
Platelets: 175 10*3/uL (ref 150–400)
RBC: 4.6 MIL/uL (ref 4.22–5.81)
RDW: 13.6 % (ref 11.5–15.5)
WBC: 10.4 10*3/uL (ref 4.0–10.5)
nRBC: 0 % (ref 0.0–0.2)

## 2022-08-29 LAB — URINALYSIS, ROUTINE W REFLEX MICROSCOPIC
Bilirubin Urine: NEGATIVE
Glucose, UA: NEGATIVE mg/dL
Hgb urine dipstick: NEGATIVE
Ketones, ur: NEGATIVE mg/dL
Leukocytes,Ua: NEGATIVE
Nitrite: NEGATIVE
Protein, ur: NEGATIVE mg/dL
Specific Gravity, Urine: 1.012 (ref 1.005–1.030)
pH: 7 (ref 5.0–8.0)

## 2022-08-29 MED ORDER — FENTANYL CITRATE PF 50 MCG/ML IJ SOSY
50.0000 ug | PREFILLED_SYRINGE | Freq: Once | INTRAMUSCULAR | Status: AC
  Administered 2022-08-29: 50 ug via INTRAVENOUS
  Filled 2022-08-29: qty 1

## 2022-08-29 MED ORDER — HYDROMORPHONE HCL 1 MG/ML IJ SOLN
0.5000 mg | Freq: Once | INTRAMUSCULAR | Status: AC
  Administered 2022-08-29: 0.5 mg via INTRAVENOUS
  Filled 2022-08-29: qty 0.5

## 2022-08-29 MED ORDER — LACTATED RINGERS IV BOLUS
1000.0000 mL | Freq: Once | INTRAVENOUS | Status: AC
  Administered 2022-08-29: 1000 mL via INTRAVENOUS

## 2022-08-29 MED ORDER — ONDANSETRON HCL 4 MG/2ML IJ SOLN
4.0000 mg | Freq: Once | INTRAMUSCULAR | Status: AC
  Administered 2022-08-29: 4 mg via INTRAVENOUS
  Filled 2022-08-29 (×2): qty 2

## 2022-08-29 MED ORDER — FENTANYL CITRATE PF 50 MCG/ML IJ SOSY
50.0000 ug | PREFILLED_SYRINGE | Freq: Once | INTRAMUSCULAR | Status: AC
  Administered 2022-08-29: 50 ug via INTRAVENOUS
  Filled 2022-08-29 (×2): qty 1

## 2022-08-29 MED ORDER — METOCLOPRAMIDE HCL 5 MG/ML IJ SOLN
10.0000 mg | Freq: Once | INTRAMUSCULAR | Status: AC
  Administered 2022-08-29: 10 mg via INTRAVENOUS
  Filled 2022-08-29: qty 2

## 2022-08-29 NOTE — Discharge Instructions (Signed)
You had a 48mm stone in your left ureter tonight.

## 2022-08-29 NOTE — ED Provider Notes (Signed)
[facility] Provider Note    Event Date/Time   First MD Initiated Contact with Patient 08/29/22 0041     (approximate)   History   Emesis and Post-op Problem   HPI  Dustin Paul is a 62 y.o. male who presents to the ED for evaluation of Emesis and Post-op Problem   I reviewed urology clinic visit from 12/27.  History of nephrolithiasis  Patient reports he was in [place] in February and had the ureteral stone where he required lithotripsy and ureteral stent placement on 2/13.  He followed up with his home urologist here and the stent was removed on 3/1.  Patient reports that he has been fine until earlier this evening he developed left groin and flank pain over the past few hours.  Reports concern for ureteral problems from the stent or another stone.  No fevers, dysuria or preceding symptoms otherwise beyond the pain   Physical Exam   Triage Vital Signs: ED Triage Vitals  Enc Vitals Group     BP 08/29/22 0033 (!) 163/108     Pulse Rate 08/29/22 0033 75     Resp 08/29/22 0033 16     Temp 08/29/22 0033 98 F (36.7 C)     Temp Source 08/29/22 0033 Oral     SpO2 08/29/22 0033 98 %     Weight 08/29/22 0031 216 lb (98 kg)     Height 08/29/22 0031 6' (1.829 m)     Head Circumference --      Peak Flow --      Pain Score 08/29/22 0031 8     Pain Loc --      Pain Edu? --      Excl. in [place]? --     Most recent vital signs: Vitals:   08/29/22 0330 08/29/22 0400  BP: (!) 147/100 (!) 147/81  Pulse: 80 87  Resp:    Temp:    SpO2: 92% 93%    General: Awake, no distress.  Seems mildly uncomfortable. CV:  Good peripheral perfusion.  Resp:  Normal effort.  Abd:  No distention.  MSK:  No deformity noted.  Neuro:  No focal deficits appreciated. Other:     ED Results / Procedures / Treatments   Labs (all labs ordered are listed, but only abnormal results are displayed) Labs Reviewed  COMPREHENSIVE METABOLIC PANEL - Abnormal; Notable for  the following components:      Result Value   Glucose, Bld 153 (*)    Creatinine, Ser 1.59 (*)    Alkaline Phosphatase 37 (*)    GFR, Estimated 49 (*)    All other components within normal limits  URINALYSIS, ROUTINE W REFLEX MICROSCOPIC - Abnormal; Notable for the following components:   Color, Urine YELLOW (*)    APPearance CLOUDY (*)    All other components within normal limits  LIPASE, BLOOD  CBC    EKG   RADIOLOGY CT renal study interpreted by me with a small left distal ureteral stone  Official radiology report(s): CT Renal Stone Study  Result Date: 08/29/2022 CLINICAL DATA:  Left flank EXAM: CT ABDOMEN AND PELVIS WITHOUT CONTRAST TECHNIQUE: Multidetector CT imaging of the abdomen and pelvis was performed following the standard protocol without IV contrast. RADIATION DOSE REDUCTION: This exam was performed according to the departmental dose-optimization program which includes automated exposure control, adjustment of the mA and/or kV according to patient size and/or use of iterative reconstruction technique. COMPARISON:  CT abdomen pelvis dated  01/01/2015. FINDINGS: Evaluation of this exam is limited in the absence of intravenous contrast. Lower chest: The visualized lung bases are clear. No intra-abdominal free air or free fluid. Hepatobiliary: Several small liver cysts. The liver is otherwise unremarkable. No diffusion. The gallbladder is unremarkable Pancreas: Unremarkable. No pancreatic ductal dilatation or surrounding inflammatory changes. Spleen: Normal in size without focal abnormality. Adrenals/Urinary Tract: Adrenal glands unremarkable. Multiple nonobstructing right renal calculi measure up to 5 mm in the interpolar right kidney. There is no hydronephrosis on the right. There is a faint 3 mm stone in the distal left ureter (75/2 and 59/5). There is mild left hydronephrosis. Mild left perinephric stranding, nonspecific. Correlation with urinalysis recommended to exclude  superimposed UTI. The urinary bladder is grossly unremarkable. Stomach/Bowel: There is sigmoid diverticulosis without active inflammatory changes. There is no bowel obstruction or active inflammation. The appendix is normal. Vascular/Lymphatic: Mild aortoiliac atherosclerotic disease. The IVC is unremarkable. No portal venous gas. There is no adenopathy. Reproductive: The prostate and seminal vesicles are grossly unremarkable. No pelvic mass. Other: None Musculoskeletal: No acute or significant osseous findings. IMPRESSION: 1. A faint 3 mm distal left ureteral stone with mild left hydronephrosis. Correlation with urinalysis recommended to exclude superimposed UTI. 2. Multiple nonobstructing right renal calculi. No hydronephrosis on the right. 3. Sigmoid diverticulosis. No bowel obstruction. Normal appendix. 4.  Aortic Atherosclerosis (ICD10-I70.0). Electronically Signed   By: Anner Crete M.D.   On: 08/29/2022 01:29    PROCEDURES and INTERVENTIONS:  Procedures  Medications  lactated ringers bolus 1,000 mL (0 mLs Intravenous Stopped 08/29/22 0237)  ondansetron (ZOFRAN) injection 4 mg (4 mg Intravenous Given 08/29/22 0118)  fentaNYL (SUBLIMAZE) injection 50 mcg (50 mcg Intravenous Given 08/29/22 0119)  HYDROmorphone (DILAUDID) injection 0.5 mg (0.5 mg Intravenous Given 08/29/22 0248)  metoCLOPramide (REGLAN) injection 10 mg (10 mg Intravenous Given 08/29/22 0246)  lactated ringers bolus 1,000 mL (0 mLs Intravenous Stopped 08/29/22 0447)  fentaNYL (SUBLIMAZE) injection 50 mcg (50 mcg Intravenous Given 08/29/22 0335)     IMPRESSION / MDM / ASSESSMENT AND PLAN / ED COURSE  I reviewed the triage vital signs and the nursing notes.  Differential diagnosis includes, but is not limited to, ureteral colic, ureteral rupture, pyelonephritis, UTI, diverticulitis  {Patient presents with symptoms of an acute illness or injury that is potentially life-threatening.  62 year old male with history of ureteral  stones presents to the ED with evidence of ureteral colic from a 3 mm stone suitable for outpatient management.  His urine is without infectious features and blood work is at baseline with CKD and a normal CBC.  Normal lipase.  CT confirms a 3 mm distal ureteral stone that I suspect he passes in the ED after resolution of his pain with fluid resuscitation and analgesics.  We discussed following up with his urologist  Clinical Course as of 08/29/22 Arline Asp Aug 29, 2022  0335 Reassessed.  Feeling somewhat better.  We discussed CT results. [DS]  [id number] Reassessed feeling much better. [DS]    Clinical Course User Index [DS] Vladimir Crofts, MD     FINAL CLINICAL IMPRESSION(S) / ED DIAGNOSES   Final diagnoses:  Ureteral colic  Ureterolithiasis  Nephrolithiasis     Rx / DC Orders   ED Discharge Orders     None        Note:  This document was prepared using Dragon voice recognition software and may include unintentional dictation errors.   Vladimir Crofts, MD 08/29/22 [phone]

## 2022-08-29 NOTE — ED Triage Notes (Signed)
Pt to ED from home for vomiting, and flank pain in the belt region. Pt has significant ureter complications with stent placement. Pt is CAOx4 and in no acute distress. Pt is actively vomiting in triage. Pt is unsure if he has a new stone or if  the stent has ruptured this time.

## 2022-08-30 ENCOUNTER — Telehealth: Payer: Self-pay | Admitting: Urology

## 2022-08-30 MED ORDER — OXYCODONE-ACETAMINOPHEN 5-325 MG PO TABS: 1.0000 | ORAL_TABLET | Freq: Four times a day (QID) | ORAL | 0 refills | Status: AC | PRN

## 2022-08-30 NOTE — Telephone Encounter (Signed)
Pt called was in the E R 3/31. Need pain medication.  Oxycodone 5-325mg . [facility] [place].  E R did not give him any.  Pt is going to have his Ultrasound done.

## 2022-09-06 ENCOUNTER — Ambulatory Visit
Admission: RE | Admit: 2022-09-06 | Discharge: 2022-09-06 | Disposition: A | Discharge status: Home / Self Care | Payer: Managed Care, Other (non HMO) | Source: Ambulatory Visit | Attending: Urology | Admitting: Urology

## 2022-09-06 DIAGNOSIS — N200 Calculus of kidney: Secondary | ICD-10-CM | POA: Diagnosis present

## 2022-09-07 ENCOUNTER — Encounter: Payer: Self-pay | Admitting: *Deleted

## 2022-09-07 ENCOUNTER — Encounter: Payer: Self-pay | Admitting: Urology

## 2022-09-17 ENCOUNTER — Other Ambulatory Visit: Payer: Self-pay | Admitting: Urology

## 2022-09-17 ENCOUNTER — Ambulatory Visit: Payer: BC Managed Care – PPO | Admitting: Urology

## 2022-10-19 ENCOUNTER — Telehealth: Payer: Self-pay

## 2022-10-19 NOTE — Telephone Encounter (Signed)
  pt called left message that he needed refill on the lamotrigine. pt was last seen on 2-28 next appt 6-19      Order Providers  Prescribing Provider Encounter Provider  [provider] Neysa Hotter, MD   Outpatient Medication Detail   Disp Refills Start End   lamoTRIgine (LAMICTAL) 150 MG tablet       Sig - Route: Take 150 mg by mouth 2 (two) times daily. - Oral   Class: Historical Med

## 2022-10-19 NOTE — Telephone Encounter (Signed)
I believe it has been prescribed by his neurologist for seizures. Please advise him to contact his provider for a refill.

## 2022-10-20 NOTE — Telephone Encounter (Signed)
left message for patient per dr. Vanetta Shawl instructions that his neurologist fills the lamotrigine.

## 2022-10-28 NOTE — Telephone Encounter (Signed)
pt called lef a message that he needs a increase in his trazodone he is still not sleeping very well.  Pt last seen on 2-28 next appt 6-19

## 2022-10-28 NOTE — Telephone Encounter (Signed)
Pt.notified

## 2022-10-28 NOTE — Telephone Encounter (Signed)
He is on a high dose of trazodone, and I would not recommend any further increase at this time. Please advise him to work on sleep hygiene, such as doing some physical activity in the morning and getting good sunlight.

## 2022-11-01 ENCOUNTER — Other Ambulatory Visit: Payer: Self-pay | Admitting: Physician Assistant

## 2022-11-01 DIAGNOSIS — I10 Essential (primary) hypertension: Secondary | ICD-10-CM

## 2022-11-11 ENCOUNTER — Telehealth: Payer: Self-pay

## 2022-11-11 NOTE — Telephone Encounter (Signed)
I believe we have been receiving this request multiple times. Lamotrigine is for seizures. His neurologist has been prescribing this, and I have not been prescribing this medication. Please discuss this with the pharmacy to ensure we do not receive further refill requests

## 2022-11-11 NOTE — Telephone Encounter (Signed)
received fax requesting a refill on the lamotrigine 150mg  take one tablet twice daily. #180. pt was last seen on 2-28 next appt 6-19

## 2022-11-11 NOTE — Telephone Encounter (Signed)
Notified the pharmacy. 

## 2022-11-12 ENCOUNTER — Telehealth: Payer: Self-pay

## 2022-11-12 NOTE — Telephone Encounter (Signed)
Pt called states he needs a refill on the trazodone pt last seen on 2-28 next appt 6-19.  [facility].     Disp Refills Start End   traZODone (DESYREL) 100 MG tablet 60 tablet 3 08/06/2022 12/04/2022   Sig - Route: Take 2 tablets (200 mg total) by mouth at bedtime. - Oral   Sent to pharmacy as: traZODone (DESYREL) 100 MG tablet   Notes to Pharmacy: This medication is scheduled to be started on 3/8. Please fill it a week prior to avoid accumulating excess medication.   E-Prescribing Status: Receipt confirmed by pharmacy (07/28/2022  8:39 AM EST)

## 2022-11-12 NOTE — Telephone Encounter (Signed)
called pharmacy last filled may 17th and there is no refills

## 2022-11-12 NOTE — Telephone Encounter (Signed)
I spoke with the pharmacy. He filled his prescription on 2/28, 3/28, 4/23, and 5/17, each for 60 tabs, which should last until his next visit. Please ensure that he takes two tabs per day at night. I plan to refill his prescription after the next visit to avoid an excess of medication. Let me know if he has any concerns about this.

## 2022-11-12 NOTE — Telephone Encounter (Signed)
Planning to fill at the next visit. Please verify with the pharmacy that he has enough to last until then.

## 2022-11-13 NOTE — Progress Notes (Unsigned)
Virtual Visit via Video Note  I connected with Dustin Paul on 11/17/22 at  8:00 AM EDT by a video enabled telemedicine application and verified that I am speaking with the correct person using two identifiers.  Location: Patient: home Provider: office Persons participated in the visit- patient, provider    I discussed the limitations of evaluation and management by telemedicine and the availability of in person appointments. The patient expressed understanding and agreed to proceed.    I discussed the assessment and treatment plan with the patient. The patient was provided an opportunity to ask questions and all were answered. The patient agreed with the plan and demonstrated an understanding of the instructions.   The patient was advised to call back or seek an in-person evaluation if the symptoms worsen or if the condition fails to improve as anticipated.  I provided 15 minutes of non-face-to-face time during this encounter.   Neysa Hotter, MD    [facility] MD/PA/NP OP Progress Note  11/17/2022 8:24 AM Dustin Paul  MRN:  [id number]  Chief Complaint:  Chief Complaint  Patient presents with   Follow-up   HPI:  This is a follow-up appointment for depression, insomnia.  He states that he has middle insomnia for the past few weeks.  He tends to think about the things he needs to do.  He wonders if he can try higher dose of trazodone as he used to be on 250-300 mg in the past.  He breathes heavily when he sleeps on certain position.  He has been doing well otherwise.  He is back to his previous work.  There was some concern of safety issues, and he had decided to leave the job.  He knows his supervisor, and he likes the work schedule as it is more relaxed.  His mother may need to have procedure.  He is planning to go there as he can work from home.  He denies feeling depressed.  He has decrease in appetite.  He is not concerned about weight loss at this time, although he agrees to  monitor moving forward.  He denies SI.  He drinks 1-2 drinks per week.  He denies drug use.  He drinks 1 coffee in the morning.  He has been active during the day.    211 lbs Wt Readings from Last 3 Encounters:  08/29/22 216 lb (98 kg)  07/30/22 221 lb (100.2 kg)  07/26/22 223 lb (101.2 kg)     Visit Diagnosis:    ICD-10-CM   1. MDD (major depressive disorder), recurrent, in full remission (HCC)  F33.42     2. Insomnia, unspecified type  G47.00 Ambulatory referral to Pulmonology    3. Insomnia due to mental condition  F51.05 traZODone (DESYREL) 100 MG tablet      Past Psychiatric History: Please see initial evaluation for full details. I have reviewed the history. No updates at this time.     Past Medical History:  Past Medical History:  Diagnosis Date   Aneurysm of artery (HCC) 2011   LEFT (19 MM) AND RIGHT (11 MM) middle cerebral   Chronic kidney disease    H/O STONES   Complication of anesthesia    Headache    MIGRAINES   Insomnia    Lumbar stress fracture    PONV (postoperative nausea and vomiting)    AFTER KNEE SURGERY IN 1980'S   Seasonal allergies    Seizures (HCC)    last seizure in 2015   Visual hallucinations  Past Surgical History:  Procedure Laterality Date   BRAIN SURGERY  2011   2 ANEURYSMS-TOTAL OF 9 CLIPS IN BRAIN FROM ANEURYSM   COLONOSCOPY WITH PROPOFOL N/A 01/16/2015   Procedure: COLONOSCOPY WITH PROPOFOL;  Surgeon: Wallace Cullens, MD;  Location: [facility] ENDOSCOPY;  Service: Gastroenterology;  Laterality: N/A;   ESOPHAGOGASTRODUODENOSCOPY (EGD) WITH PROPOFOL N/A 12/05/2018   Procedure: ESOPHAGOGASTRODUODENOSCOPY (EGD) WITH PROPOFOL;  Surgeon: Toledo, Boykin Nearing, MD;  Location: [facility] ENDOSCOPY;  Service: Gastroenterology;  Laterality: N/A;   KNEE ARTHROSCOPY WITH MEDIAL MENISECTOMY Left 11/20/2015   Procedure: KNEE ARTHROSCOPY WITH partial MEDIAL MENISECTOMY / WITH REMOVAL OF LOOSE BODY;  Surgeon: Kennedy Bucker, MD;  Location: [facility] ORS;  Service: Orthopedics;   Laterality: Left;   KNEE SURGERY Left     Family Psychiatric History: Please see initial evaluation for full details. I have reviewed the history. No updates at this time.     Family History:  Family History  Problem Relation Age of Onset   Depression Maternal Aunt     Social History:  Social History   Socioeconomic History   Marital status: Married    Spouse name: Not on file   Number of children: 2   Years of education: Not on file   Highest education level: Bachelor's degree (e.g., BA, AB, BS)  Occupational History   Occupation: engineer/retired  Tobacco Use   Smoking status: Never   Smokeless tobacco: Never  Vaping Use   Vaping Use: Never used  Substance and Sexual Activity   Alcohol use: No    Alcohol/week: 0.0 standard drinks of alcohol    Comment: 1-2 drinks a month   Drug use: No   Sexual activity: Not Currently  Other Topics Concern   Not on file  Social History Narrative   Not on file   Social Determinants of Health   Financial Resource Strain: Low Risk  (08/16/2017)   Overall Financial Resource Strain (CARDIA)    Difficulty of Paying Living Expenses: Not hard at all  Food Insecurity: No Food Insecurity (08/16/2017)   Hunger Vital Sign    Worried About Running Out of Food in the Last Year: Never true    Ran Out of Food in the Last Year: Never true  Transportation Needs: No Transportation Needs (08/16/2017)   PRAPARE - Administrator, Civil Service (Medical): No    Lack of Transportation (Non-Medical): No  Physical Activity: Not on file  Stress: No Stress Concern Present (08/16/2017)   [employer] of Occupational Health - Occupational Stress Questionnaire    Feeling of Stress : Not at all  Social Connections: Not on file    Allergies:  Allergies  Allergen Reactions   Levetiracetam Other (See Comments)    Racing thoughts per [facility] neurology note 03-11-11 Other reaction(s): Other (See Comments) Racing thoughts   Aspirin     Nose  bleed, thin blood    Metabolic Disorder Labs: No results found for: "HGBA1C", "MPG" Lab Results  Component Value Date   PROLACTIN 18.9 (H) 06/25/2015   Lab Results  Component Value Date   CHOL 136 07/27/2022   TRIG 150 (H) 07/27/2022   HDL 33 (L) 07/27/2022   CHOLHDL 3.9 06/05/2021   LDLCALC 77 07/27/2022   LDLCALC 80 06/05/2021   Lab Results  Component Value Date   TSH 1.740 07/27/2022   TSH 2.430 06/25/2015    Therapeutic Level Labs: No results found for: "LITHIUM" No results found for: "VALPROATE" No results found for: "CBMZ"  Current  Medications: Current Outpatient Medications  Medication Sig Dispense Refill   famotidine-calcium carbonate-magnesium hydroxide (PEPCID COMPLETE) 10-800-165 MG chewable tablet Chew 1 tablet by mouth daily as needed.     fluticasone (FLONASE) 50 MCG/ACT nasal spray Place 2 sprays into both nostrils daily. 16 g 6   lamoTRIgine (LAMICTAL) 150 MG tablet Take 150 mg by mouth 2 (two) times daily.     lisinopril (ZESTRIL) 5 MG tablet TAKE 1 TABLET BY MOUTH DAILY 90 tablet 1   Melatonin 10 MG CAPS Take 1 capsule by mouth at bedtime.     [START ON 11/26/2022] mirtazapine (REMERON) 15 MG tablet Take 1 tablet (15 mg total) by mouth at bedtime. 30 tablet 1   oxyCODONE-acetaminophen (PERCOCET/ROXICET) 5-325 MG tablet Take 1 tablet by mouth every 6 (six) hours as needed for severe pain. (Patient not taking: Reported on 11/17/2022) 10 tablet 0   QUDEXY XR 150 MG CS24 take 1 capsule by mouth daily at bedtime  11   RABEprazole (ACIPHEX) 20 MG tablet TAKE 1 TABLET BY MOUTH DAILY 90 tablet 4   solifenacin (VESICARE) 10 MG tablet Take 1 tablet (10 mg total) by mouth daily. 7 tablet 0   tadalafil (CIALIS) 20 MG tablet TAKE 1 TABLET 1 HOUR PRIOR TO INTERCOURSE. 30 tablet 6   traZODone (DESYREL) 100 MG tablet Take 2-2.5 tablets (200-250 mg total) by mouth at bedtime. 75 tablet 1   No current facility-administered medications for this visit.      Musculoskeletal: Strength & Muscle Tone:  N.A Gait & Station:  N/A Patient leans: N/A  Psychiatric Specialty Exam: Review of Systems  There were no vitals taken for this visit.There is no height or weight on file to calculate BMI.  General Appearance: Fairly Groomed  Eye Contact:  Good  Speech:  Clear and Coherent  Volume:  Normal  Mood:   good  Affect:  Appropriate, Congruent, and Full Range  Thought Process:  Coherent  Orientation:  Full (Time, Place, and Person)  Thought Content: Logical   Suicidal Thoughts:  No  Homicidal Thoughts:  No  Memory:  Immediate;   Good  Judgement:  Good  Insight:  Good  Psychomotor Activity:  Normal  Concentration:  Concentration: Good and Attention Span: Good  Recall:  Good  Fund of Knowledge: Good  Language: Good  Akathisia:  No  Handed:  Right  AIMS (if indicated): not done  Assets:  Communication Skills Desire for Improvement  ADL's:  Intact  Cognition: WNL  Sleep:  Poor   Screenings: AIMS    Flowsheet Row Office Visit from 07/12/2016 in [facility]  AIMS Total Score 0      PHQ2-9    Flowsheet Row Office Visit from 07/26/2022 in [facility] Office Visit from 03/11/2022 in [facility] Office Visit from 03/24/2021 in [facility] Video Visit from 01/06/2021 in [facility] Video Visit from 11/27/2020 in [facility]  PHQ-2 Total Score 0 0 0 0 0  PHQ-9 Total Score 0 7 1 -- --      Flowsheet Row ED from 08/29/2022 in [facility] at [facility] ED from 03/05/2022 in  [facility]  Video Visit from 01/06/2021 in [facility]  C-SSRS RISK CATEGORY No Risk No Risk No Risk        Assessment and Plan:  Dustin Paul is  a 62 y.o. year old male with a  history of episodic mood disorder, insomnia, CKD, migraine, cerebral aneurysm, hx of bilateral MCA aneurysms s/p clipping on the left, who presents for follow up appointment for below.   1. MDD (major depressive disorder), recurrent, in full remission (HCC) Acute stressors include: started a job in May 2024 (previously worked there) Other stressors include:  mother in [place], taking care of her step mother, estranged relationship with one of his daughter   History: episodes of depression since brain surgery 10 years ago. Relapsed in the context of tapering off mirtazapine  He denies any significant mood symptoms since the last visit.  Will continue current dose of mirtazapine as maintenance treatment for depression.   2. Insomnia, unspecified type Worsening.  He has middle insomnia with occasional racing thoughts.  Will uptitrate trazodone as needed for insomnia.  Discussed potential risk of drowsiness and fall.  He agreed for evaluation of sleep apnea.  Will make referral.    Plan (according to the pharmacy, the patient can fill medication only monthly due to insurance) 1.         Continue mirtazapine 15 mg at night  2.         Increase Trazodone 250 mg at night as needed for insomnia 3.  Referral for evaluation of sleep apnea 3.         Next appointment-  8/14 at 8 AM, video - on lamotrigine 150 mg twice a day for seizure. He states that this medication is prescribed by his neurologist - on flexeril, armodafinil prn  The patient demonstrates the following risk factors for suicide: Chronic risk factors for suicide include: psychiatric disorder of depression . Acute risk factors for suicide include: N/A. Protective factors for this patient include: positive social support, coping skills, and hope for the future. Considering these factors, the overall suicide risk at this point appears to be low. Patient is appropriate for outpatient follow up.         Collaboration of Care: Collaboration  of Care: Other reviewed notes in Epic  Patient/Guardian was advised Release of Information must be obtained prior to any record release in order to collaborate their care with an outside provider. Patient/Guardian was advised if they have not already done so to contact the registration department to sign all necessary forms in order for [place] to release information regarding their care.   Consent: Patient/Guardian gives verbal consent for treatment and assignment of benefits for services provided during this visit. Patient/Guardian expressed understanding and agreed to proceed.    Neysa Hotter, MD 11/17/2022, 8:24 AM

## 2022-11-17 ENCOUNTER — Encounter: Payer: Self-pay | Admitting: Psychiatry

## 2022-11-17 ENCOUNTER — Telehealth (INDEPENDENT_AMBULATORY_CARE_PROVIDER_SITE_OTHER): Payer: 59 | Admitting: Psychiatry

## 2022-11-17 DIAGNOSIS — F5105 Insomnia due to other mental disorder: Secondary | ICD-10-CM

## 2022-11-17 DIAGNOSIS — F3342 Major depressive disorder, recurrent, in full remission: Secondary | ICD-10-CM | POA: Diagnosis not present

## 2022-11-17 DIAGNOSIS — G4700 Insomnia, unspecified: Secondary | ICD-10-CM | POA: Diagnosis not present

## 2022-11-17 MED ORDER — TRAZODONE HCL 100 MG PO TABS: 200.0000 mg | ORAL_TABLET | Freq: Every day | ORAL | 1 refills | Status: DC

## 2022-11-17 MED ORDER — MIRTAZAPINE 15 MG PO TABS: 15.0000 mg | ORAL_TABLET | Freq: Every day | ORAL | 1 refills | Status: DC

## 2022-11-17 NOTE — Patient Instructions (Signed)
1.         Continue mirtazapine 15 mg at night  2.         Increase Trazodone 250 mg at night as needed for insomnia 3.  Referral for evaluation of sleep apnea 3.         Next appointment-  8/14 at 8 AM

## 2022-11-18 ENCOUNTER — Telehealth: Payer: Self-pay

## 2022-11-18 NOTE — Telephone Encounter (Signed)
received fax requesting a refill on the lamotrlgine 150mg . pt last seen on 6-19 next appt 8-14

## 2022-11-18 NOTE — Telephone Encounter (Signed)
Could you make a note on his chart about this. We have been receiving this request multiple times. Lamotrigine is for seizures. His neurologist has been prescribing this, and I have not been prescribing this medication. Please discuss this with the pharmacy to ensure we do not receive further refill requests (I beleive you dicussed this with them a few days ago)

## 2022-12-11 ENCOUNTER — Other Ambulatory Visit: Payer: Self-pay | Admitting: Psychiatry

## 2022-12-11 DIAGNOSIS — F5105 Insomnia due to other mental disorder: Secondary | ICD-10-CM

## 2023-01-04 ENCOUNTER — Telehealth: Payer: Self-pay | Admitting: Psychiatry

## 2023-01-04 NOTE — Telephone Encounter (Signed)
per pharmacy tech it is too soon for patient to refill this medication not able to fill until 8-14.

## 2023-01-04 NOTE — Telephone Encounter (Signed)
Patient appt is 01/18/23 but will run out of the trazodone 100 mg next week. Can a refill be sent to [facility]?

## 2023-01-06 NOTE — Telephone Encounter (Signed)
Defer to Dr.Hisada 

## 2023-01-12 ENCOUNTER — Telehealth: Payer: 59 | Admitting: Psychiatry

## 2023-01-12 NOTE — Telephone Encounter (Signed)
If the patient has not been notified of the above, please do so. Thanks.
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
## 2023-01-18 ENCOUNTER — Encounter: Payer: Self-pay | Admitting: Psychiatry

## 2023-01-18 ENCOUNTER — Telehealth (INDEPENDENT_AMBULATORY_CARE_PROVIDER_SITE_OTHER): Payer: 59 | Admitting: Psychiatry

## 2023-01-18 DIAGNOSIS — F5105 Insomnia due to other mental disorder: Secondary | ICD-10-CM | POA: Diagnosis not present

## 2023-01-18 DIAGNOSIS — F3342 Major depressive disorder, recurrent, in full remission: Secondary | ICD-10-CM | POA: Diagnosis not present

## 2023-01-18 MED ORDER — TRAZODONE HCL 100 MG PO TABS: 250.0000 mg | ORAL_TABLET | Freq: Every evening | ORAL | 1 refills | Status: DC | PRN

## 2023-01-18 MED ORDER — MIRTAZAPINE 15 MG PO TABS: 15.0000 mg | ORAL_TABLET | Freq: Every day | ORAL | 2 refills | Status: DC

## 2023-01-18 NOTE — Patient Instructions (Signed)
1.         Continue mirtazapine 15 mg at night  2.         Continue Trazodone 250 mg at night as needed for insomnia 3.         Referred for evaluation of sleep apnea 3.         Next appointment-  11/13 at 8:20

## 2023-01-20 ENCOUNTER — Telehealth: Payer: Self-pay | Admitting: Family Medicine

## 2023-01-20 NOTE — Telephone Encounter (Signed)
Patient called stated he is losing access to his provider that prescribes his traZODone (DESYREL) 100 MG tablet and mirtazapine (REMERON) 15 MG tablet. Patient would like for Dr Sherrie Mustache to pick up and start prescribing the meds for him. Please f/u with patient

## 2023-01-20 NOTE — Telephone Encounter (Signed)
Please Review

## 2023-01-21 NOTE — Telephone Encounter (Signed)
That's fine

## 2023-01-24 NOTE — Telephone Encounter (Signed)
Deatiled VM left per DPR. CRM created. OK for PEC to advise if patient returns call

## 2023-03-18 ENCOUNTER — Other Ambulatory Visit: Payer: Self-pay | Admitting: Psychiatry

## 2023-03-18 DIAGNOSIS — F5105 Insomnia due to other mental disorder: Secondary | ICD-10-CM

## 2023-03-19 NOTE — Telephone Encounter (Signed)
Please contact the pharmacy. He should have another refill to last until Nov

## 2023-03-21 NOTE — Telephone Encounter (Signed)
Called pharmacy spoke to Amy she stated that they just wanted the refill on file for the next refill

## 2023-04-07 ENCOUNTER — Other Ambulatory Visit: Payer: Self-pay | Admitting: Family Medicine

## 2023-04-07 DIAGNOSIS — F5105 Insomnia due to other mental disorder: Secondary | ICD-10-CM

## 2023-04-07 NOTE — Progress Notes (Signed)
Virtual Visit via Video Note  I connected with Dustin Paul on 04/13/23 at  8:20 AM EST by a video enabled telemedicine application and verified that I am speaking with the correct person using two identifiers.  Location: Patient: home Provider: office Persons participated in the visit- patient, provider    I discussed the limitations of evaluation and management by telemedicine and the availability of in person appointments. The patient expressed understanding and agreed to proceed.    I discussed the assessment and treatment plan with the patient. The patient was provided an opportunity to ask questions and all were answered. The patient agreed with the plan and demonstrated an understanding of the instructions.   The patient was advised to call back or seek an in-person evaluation if the symptoms worsen or if the condition fails to improve as anticipated.  I provided 10 minutes of non-face-to-face time during this encounter.   Neysa Hotter, MD    [facility] MD/PA/NP OP Progress Note  04/13/2023 8:41 AM Dustin Paul  MRN:  [id number]  Chief Complaint:  Chief Complaint  Patient presents with   Follow-up   HPI:  This is a follow-up appointment for depression, insomnia.  He states that he has been doing very well.  The work has been going good.  He went to renaissance festival with his daughter, and he enjoyed the time.  He reports good relationship with his wife.  He and his wife checks in with his mother-in-law in [place]. His mother in [place] had a surgery several weeks ago, and she has been doing well.  He denies feeling depressed.  He feels relaxed, and denies irritability or anxiety.  He sleeps well as long as he takes trazodone.  He denies drowsiness in the morning.  He has good appetite.  He denies SI. He does not think he can do sleep evaluation at this time as insurance coverage is not good. He was able to communicate with his primary care, who has agreed to take over his  medication.   216 lbs Wt Readings from Last 3 Encounters:  08/29/22 216 lb (98 kg)  07/30/22 221 lb (100.2 kg)  07/26/22 223 lb (101.2 kg)     Employment: Engineer, manufacturing  Support: wife, youngest daughter Household: wife Marital status: married since 62-Nov-2018, his former wife died in Apr 22, 2012 from breast cancer,  Number of children: 2. (in [place], [place]), he reports a strange relationship with his 62 yo oldest daughter  Visit Diagnosis:    ICD-10-CM   1. MDD (major depressive disorder), recurrent, in full remission (HCC)  F33.42     2. Insomnia, unspecified type  G47.00       Past Psychiatric History: Please see initial evaluation for full details. I have reviewed the history. No updates at this time.     Past Medical History:  Past Medical History:  Diagnosis Date   Aneurysm of artery (HCC) April 22, 2010   LEFT (19 MM) AND RIGHT (11 MM) middle cerebral   Chronic kidney disease    H/O STONES   Complication of anesthesia    Headache    MIGRAINES   Insomnia    Lumbar stress fracture    PONV (postoperative nausea and vomiting)    AFTER KNEE SURGERY IN 1980'S   Seasonal allergies    Seizures (HCC)    last seizure in 22-Apr-2014   Visual hallucinations     Past Surgical History:  Procedure Laterality Date   BRAIN SURGERY  04-22-2010   2  ANEURYSMS-TOTAL OF 9 CLIPS IN BRAIN FROM ANEURYSM   COLONOSCOPY WITH PROPOFOL N/A 01/16/2015   Procedure: COLONOSCOPY WITH PROPOFOL;  Surgeon: Wallace Cullens, MD;  Location: [facility] ENDOSCOPY;  Service: Gastroenterology;  Laterality: N/A;   ESOPHAGOGASTRODUODENOSCOPY (EGD) WITH PROPOFOL N/A 12/05/2018   Procedure: ESOPHAGOGASTRODUODENOSCOPY (EGD) WITH PROPOFOL;  Surgeon: Toledo, Boykin Nearing, MD;  Location: [facility] ENDOSCOPY;  Service: Gastroenterology;  Laterality: N/A;   KNEE ARTHROSCOPY WITH MEDIAL MENISECTOMY Left 11/20/2015   Procedure: KNEE ARTHROSCOPY WITH partial MEDIAL MENISECTOMY / WITH REMOVAL OF LOOSE BODY;  Surgeon: Kennedy Bucker, MD;  Location: [facility] ORS;   Service: Orthopedics;  Laterality: Left;   KNEE SURGERY Left     Family Psychiatric History: Please see initial evaluation for full details. I have reviewed the history. No updates at this time.     Family History:  Family History  Problem Relation Age of Onset   Depression Maternal Aunt     Social History:  Social History   Socioeconomic History   Marital status: Married    Spouse name: Not on file   Number of children: 2   Years of education: Not on file   Highest education level: Bachelor's degree (e.g., BA, AB, BS)  Occupational History   Occupation: engineer/retired  Tobacco Use   Smoking status: Never   Smokeless tobacco: Never  Vaping Use   Vaping status: Never Used  Substance and Sexual Activity   Alcohol use: No    Alcohol/week: 0.0 standard drinks of alcohol    Comment: 1-2 drinks a month   Drug use: No   Sexual activity: Not Currently  Other Topics Concern   Not on file  Social History Narrative   Not on file   Social Determinants of Health   Financial Resource Strain: Low Risk  (08/16/2017)   Overall Financial Resource Strain (CARDIA)    Difficulty of Paying Living Expenses: Not hard at all  Food Insecurity: No Food Insecurity (08/16/2017)   Hunger Vital Sign    Worried About Running Out of Food in the Last Year: Never true    Ran Out of Food in the Last Year: Never true  Transportation Needs: No Transportation Needs (08/16/2017)   PRAPARE - Administrator, Civil Service (Medical): No    Lack of Transportation (Non-Medical): No  Physical Activity: Not on file  Stress: No Stress Concern Present (08/16/2017)   [employer] of Occupational Health - Occupational Stress Questionnaire    Feeling of Stress : Not at all  Social Connections: Not on file    Allergies:  Allergies  Allergen Reactions   Levetiracetam Other (See Comments)    Racing thoughts per [facility] neurology note 03-11-11 Other reaction(s): Other (See Comments) Racing  thoughts   Aspirin     Nose bleed, thin blood    Metabolic Disorder Labs: No results found for: "HGBA1C", "MPG" Lab Results  Component Value Date   PROLACTIN 18.9 (H) 06/25/2015   Lab Results  Component Value Date   CHOL 136 07/27/2022   TRIG 150 (H) 07/27/2022   HDL 33 (L) 07/27/2022   CHOLHDL 3.9 06/05/2021   LDLCALC 77 07/27/2022   LDLCALC 80 06/05/2021   Lab Results  Component Value Date   TSH 1.740 07/27/2022   TSH 2.430 06/25/2015    Therapeutic Level Labs: No results found for: "LITHIUM" No results found for: "VALPROATE" No results found for: "CBMZ"  Current Medications: Current Outpatient Medications  Medication Sig Dispense Refill   famotidine-calcium carbonate-magnesium hydroxide (PEPCID COMPLETE)  10-800-165 MG chewable tablet Chew 1 tablet by mouth daily as needed.     fluticasone (FLONASE) 50 MCG/ACT nasal spray Place 2 sprays into both nostrils daily. 16 g 6   lamoTRIgine (LAMICTAL) 150 MG tablet Take 150 mg by mouth 2 (two) times daily.     lisinopril (ZESTRIL) 5 MG tablet TAKE 1 TABLET BY MOUTH DAILY 90 tablet 1   Melatonin 10 MG CAPS Take 1 capsule by mouth at bedtime.     mirtazapine (REMERON) 15 MG tablet TAKE ONE TABLET (15 MG) BY MOUTH AT BEDTIME 30 tablet 2   oxyCODONE-acetaminophen (PERCOCET/ROXICET) 5-325 MG tablet Take 1 tablet by mouth every 6 (six) hours as needed for severe pain. (Patient not taking: Reported on 11/17/2022) 10 tablet 0   QUDEXY XR 150 MG CS24 take 1 capsule by mouth daily at bedtime  11   RABEprazole (ACIPHEX) 20 MG tablet TAKE 1 TABLET BY MOUTH DAILY 90 tablet 4   solifenacin (VESICARE) 10 MG tablet Take 1 tablet (10 mg total) by mouth daily. 7 tablet 0   tadalafil (CIALIS) 20 MG tablet TAKE 1 TABLET 1 HOUR PRIOR TO INTERCOURSE. 30 tablet 6   traZODone (DESYREL) 100 MG tablet TAKE TWO AND A HALF TABLETS (250 MG) BY MOUTH AT BEDTIME AS NEEDED FOR SLEEP 75 tablet 1   No current facility-administered medications for this visit.      Musculoskeletal: Strength & Muscle Tone:  N/A Gait & Station:  N/A Patient leans: N/A  Psychiatric Specialty Exam: Review of Systems  Psychiatric/Behavioral: Negative.    All other systems reviewed and are negative.   There were no vitals taken for this visit.There is no height or weight on file to calculate BMI.  General Appearance: Well Groomed  Eye Contact:  Good  Speech:  Clear and Coherent  Volume:  Normal  Mood:   good  Affect:  Appropriate, Congruent, and Full Range  Thought Process:  Coherent  Orientation:  Full (Time, Place, and Person)  Thought Content: Logical   Suicidal Thoughts:  No  Homicidal Thoughts:  No  Memory:  Immediate;   Good  Judgement:  Good  Insight:  Good  Psychomotor Activity:  Normal  Concentration:  Concentration: Good and Attention Span: Good  Recall:  Good  Fund of Knowledge: Good  Language: Good  Akathisia:  No  Handed:  Right  AIMS (if indicated): not done  Assets:  Communication Skills Desire for Improvement  ADL's:  Intact  Cognition: WNL  Sleep:  Good   Screenings: AIMS    Flowsheet Row Office Visit from 07/12/2016 in [facility]  AIMS Total Score 0      PHQ2-9    Flowsheet Row Office Visit from 07/26/2022 in [facility] Office Visit from 03/11/2022 in [facility] Office Visit from 03/24/2021 in [facility] Video Visit from 01/06/2021 in [facility] Video Visit from 11/27/2020 in [facility]  PHQ-2 Total Score 0 0 0 0 0  PHQ-9 Total Score 0 7 1 -- --      Flowsheet Row ED from 08/29/2022 in [facility] at [facility] ED from 03/05/2022 in [facility]  Video Visit from 01/06/2021 in [facility]  C-SSRS RISK CATEGORY No Risk No Risk No Risk         Assessment and Plan:  Dustin Paul  is a 62 y.o. year old male with a history of episodic mood disorder, insomnia, CKD, migraine, cerebral aneurysm, hx of bilateral MCA aneurysms s/p clipping on the left, who presents for follow up appointment for below.    1. MDD (major depressive disorder), recurrent, in full remission (HCC) Acute stressors include: started a job in May 2024 (previously worked there), mother underwent surgery (gyn related) Other stressors include:  mother in [place], taking care of his step mother in [place], estranged relationship with one of his daughter   History: episodes of depression since brain surgery 10 years ago. Relapsed in the context of tapering off mirtazapine   He denies any significant mood symptoms since the last visit.  Will continue current dose of mirtazapine as maintenance treatment for depression.   2. Insomnia, unspecified type Stable.  Although he may benefit from CBT I, he is unable to pursue this due to issues with insurance.  He was reminded to pursue evaluation of sleep apnea when he is interested.  Will continue trazodone as needed for insomnia.    Plan (according to the pharmacy, the patient can fill medication only monthly due to insurance) 1.         Continue mirtazapine 15 mg at night  2.         Continue Trazodone 250 mg at night as needed for insomnia 3.         Referred for evaluation of sleep apnea 3.         Next appointment-  N/A. His PCP will take over his medication moving forward. - on lamotrigine 150 mg twice a day for seizure. He states that this medication is prescribed by his neurologist - on flexeril, armodafinil once a week prn   The patient demonstrates the following risk factors for suicide: Chronic risk factors for suicide include: psychiatric disorder of depression . Acute risk factors for suicide include: N/A. Protective factors for this patient include: positive social support, coping skills, and hope for the  future. Considering these factors, the overall suicide risk at this point appears to be low. Patient is appropriate for outpatient follow up.         Collaboration of Care: Collaboration of Care: Other reviewed notes in Epic  Patient/Guardian was advised Release of Information must be obtained prior to any record release in order to collaborate their care with an outside provider. Patient/Guardian was advised if they have not already done so to contact the registration department to sign all necessary forms in order for [place] to release information regarding their care.   Consent: Patient/Guardian gives verbal consent for treatment and assignment of benefits for services provided during this visit. Patient/Guardian expressed understanding and agreed to proceed.    Neysa Hotter, MD 04/13/2023, 8:41 AM

## 2023-04-08 NOTE — Telephone Encounter (Signed)
Requested medication (s) are due for refill today: yes  Requested medication (s) are on the active medication list: yes  Last refill:  01/18/23  Future visit scheduled: no  Notes to clinic:  Unable to refill per protocol, last refill by another provider.      Requested Prescriptions  Pending Prescriptions Disp Refills   traZODone (DESYREL) 100 MG tablet [Pharmacy Med Name: TRAZODONE HCL 100 MG TAB] 75 tablet 1    Sig: TAKE TWO AND A HALF TABLETS (250 MG) BY MOUTH AT BEDTIME AS NEEDED FOR SLEEP     Psychiatry: Antidepressants - Serotonin Modulator Failed - 04/07/2023  1:59 PM      Failed - Valid encounter within last 6 months    Recent Outpatient Visits           8 months ago Annual physical exam   [facility] Malva Limes, MD   10 months ago Essential (primary) hypertension   [facility] Alfredia Ferguson, PA-C   1 year ago Cough, unspecified type   [facility] [place] [place], PA-C   2 years ago Annual physical exam   [facility] Malva Limes, MD   3 years ago Annual physical exam   [facility] Malva Limes, MD               mirtazapine (REMERON) 15 MG tablet [Pharmacy Med Name: MIRTAZAPINE 15 MG TAB] 30 tablet 2    Sig: TAKE ONE TABLET (15 MG) BY MOUTH AT BEDTIME     Psychiatry: Antidepressants - mirtazapine Failed - 04/07/2023  1:59 PM      Failed - Valid encounter within last 6 months    Recent Outpatient Visits           8 months ago Annual physical exam   [facility] Malva Limes, MD   10 months ago Essential (primary) hypertension   [facility] Alfredia Ferguson, PA-C   1 year ago Cough, unspecified type   [facility] [place], [place], PA-C   2 years ago Annual physical exam   [facility] Malva Limes, MD    3 years ago Annual physical exam   [facility] Malva Limes, MD

## 2023-04-13 ENCOUNTER — Encounter: Payer: Self-pay | Admitting: Psychiatry

## 2023-04-13 ENCOUNTER — Telehealth (INDEPENDENT_AMBULATORY_CARE_PROVIDER_SITE_OTHER): Payer: 59 | Admitting: Psychiatry

## 2023-04-13 DIAGNOSIS — F3342 Major depressive disorder, recurrent, in full remission: Secondary | ICD-10-CM

## 2023-04-13 DIAGNOSIS — G4700 Insomnia, unspecified: Secondary | ICD-10-CM | POA: Diagnosis not present

## 2023-04-13 NOTE — Patient Instructions (Signed)
1.         Continue mirtazapine 15 mg at night  2.         Continue Trazodone 250 mg at night as needed for insomnia 3.  Please continue care with your primary care provider

## 2023-05-13 ENCOUNTER — Encounter: Payer: Self-pay | Admitting: Urology

## 2023-05-30 ENCOUNTER — Other Ambulatory Visit: Payer: Self-pay | Admitting: Urology

## 2023-06-03 ENCOUNTER — Other Ambulatory Visit: Payer: Self-pay | Admitting: Physician Assistant

## 2023-06-03 DIAGNOSIS — I10 Essential (primary) hypertension: Secondary | ICD-10-CM

## 2023-06-13 ENCOUNTER — Other Ambulatory Visit: Payer: Self-pay | Admitting: Family Medicine

## 2023-06-13 DIAGNOSIS — F5105 Insomnia due to other mental disorder: Secondary | ICD-10-CM

## 2023-06-13 NOTE — Telephone Encounter (Signed)
 Requested medication (s) are due for refill today: yes  Requested medication (s) are on the active medication list: yes  Last refill:  04/11/23 #75/1  Future visit scheduled: no  Notes to clinic:  failed d/t pt needs appt.      Requested Prescriptions  Pending Prescriptions Disp Refills   traZODone  (DESYREL ) 100 MG tablet [Pharmacy Med Name: TRAZODONE  HCL 100 MG TAB] 75 tablet 1    Sig: TAKE TWO AND A HALF TABLETS (250 MG) BY MOUTH AT BEDTIME AS NEEDED FOR SLEEP     Psychiatry: Antidepressants - Serotonin Modulator Failed - 06/13/2023  2:13 PM      Failed - Valid encounter within last 6 months    Recent Outpatient Visits           10 months ago Annual physical exam   [facility] Gasper Nancyann BRAVO, MD   1 year ago Essential (primary) hypertension   [facility] Cyndi Shaver, PA-C   1 year ago Cough, unspecified type   [facility] [place], [place], PA-C   2 years ago Annual physical exam   [facility] Gasper Nancyann BRAVO, MD   3 years ago Annual physical exam   [facility] Gasper Nancyann BRAVO, MD

## 2023-06-14 NOTE — Telephone Encounter (Signed)
 I heard from Dustin Paul that he will be following up with you moving forward, so I assumed that his medication would be managed by you. Please let me know if that is not the case, as he currently does not have any follow-up scheduled with me. Thanks.

## 2023-07-26 ENCOUNTER — Other Ambulatory Visit: Payer: Self-pay | Admitting: Family Medicine

## 2023-07-26 DIAGNOSIS — I10 Essential (primary) hypertension: Secondary | ICD-10-CM

## 2023-08-08 ENCOUNTER — Encounter: Payer: Self-pay | Admitting: Family Medicine

## 2023-08-08 ENCOUNTER — Ambulatory Visit: Admitting: Family Medicine

## 2023-08-08 ENCOUNTER — Ambulatory Visit: Payer: Self-pay | Admitting: Family Medicine

## 2023-08-08 VITALS — BP 130/83 | HR 85 | Resp 16 | Ht 72.0 in | Wt 227.0 lb

## 2023-08-08 DIAGNOSIS — R1030 Lower abdominal pain, unspecified: Secondary | ICD-10-CM | POA: Diagnosis not present

## 2023-08-08 LAB — POCT URINALYSIS DIPSTICK
Bilirubin, UA: NEGATIVE
Blood, UA: NEGATIVE
Glucose, UA: NEGATIVE
Ketones, UA: NEGATIVE
Leukocytes, UA: NEGATIVE
Nitrite, UA: NEGATIVE
Protein, UA: NEGATIVE
Spec Grav, UA: 1.01 (ref 1.010–1.025)
Urobilinogen, UA: 0.2 U/dL
pH, UA: 7 (ref 5.0–8.0)

## 2023-08-08 NOTE — Progress Notes (Signed)
 Established patient visit   Patient: Dustin Paul   DOB: 06-Jan-1961   63 y.o. Male  MRN: [id number] Visit Date: 08/08/2023  Today's healthcare provider: Mila Merry, MD   Chief Complaint  Patient presents with   Abdominal Pain    Started Saturday. Abdominal pain at belt line, constant. Has diarrhea as well.   Subjective    Ambient AI software was used for for clinical note transcription.   History of Present Illness   The patient presents with abdominal pain and diarrhea.  He experiences abdominal pain primarily on the left side, which occurs when he stands up. He is uncertain if the pain is related to a urinary issue or his colon. He has a history of kidney stones, with a significant episode last summer requiring emergency surgery in [place] to remove a stone that was obstructing his urethra. No fever, nausea, or vomiting. No changes in medications.  He has been experiencing diarrhea since Saturday, with bowel movements occurring five to six times a day. The stool is initially loose but progresses to diarrhea later in the day. He observed blood once on Saturday afternoon, which he attributes to irritation from frequent wiping. He used Preparation H wipes, which stopped the bleeding. No internal bleeding.  He had a colonoscopy in 2016 with Doctor Oh and another one with a male doctor at the [facility], but is not sure when it was  His family history is significant for colon cancer, with both his grandfather and father having had the disease.       Medications: Outpatient Medications Prior to Visit  Medication Sig   cyclobenzaprine (FLEXERIL) 10 MG tablet Take 1 tablet by mouth 3 (three) times daily.   famotidine-calcium carbonate-magnesium hydroxide (PEPCID COMPLETE) 10-800-165 MG chewable tablet Chew 1 tablet by mouth daily as needed.   fluticasone (FLONASE) 50 MCG/ACT nasal spray Place 2 sprays into both nostrils daily.   lamoTRIgine (LAMICTAL) 150 MG tablet Take  150 mg by mouth 2 (two) times daily.   lisinopril (ZESTRIL) 5 MG tablet TAKE 1 TABLET BY MOUTH DAILY   Melatonin 10 MG CAPS Take 1 capsule by mouth at bedtime.   mirtazapine (REMERON) 15 MG tablet TAKE ONE TABLET (15 MG) BY MOUTH AT BEDTIME   QUDEXY XR 150 MG CS24 take 1 capsule by mouth daily at bedtime   RABEprazole (ACIPHEX) 20 MG tablet TAKE 1 TABLET BY MOUTH DAILY   solifenacin (VESICARE) 10 MG tablet Take 1 tablet (10 mg total) by mouth daily.   tadalafil (CIALIS) 20 MG tablet TAKE 1 TABLET 1 HOUR PRIOR TO INTERCOURSE.   topiramate (TOPAMAX) 100 MG tablet Take 100 mg by mouth daily.   topiramate ER (QUDEXY XR) 100 MG CS24 sprinkle capsule Take 100 mg by mouth at bedtime.   traZODone (DESYREL) 100 MG tablet TAKE TWO AND A HALF TABLETS (250 MG) BY MOUTH AT BEDTIME AS NEEDED FOR SLEEP   oxyCODONE-acetaminophen (PERCOCET/ROXICET) 5-325 MG tablet Take 1 tablet by mouth every 6 (six) hours as needed for severe pain. (Patient not taking: Reported on 11/17/2022)   No facility-administered medications prior to visit.   Review of Systems  Constitutional:  Negative for appetite change, chills and fever.  Respiratory:  Negative for chest tightness, shortness of breath and wheezing.   Cardiovascular:  Negative for chest pain and palpitations.  Gastrointestinal:  Positive for abdominal pain and diarrhea. Negative for blood in stool, constipation, nausea, rectal pain and vomiting.  Objective    BP 130/83 (BP Location: Left Arm, Patient Position: Sitting, Cuff Size: Large)   Pulse 85   Resp 16   Ht 6' (1.829 m)   Wt 227 lb (103 kg)   SpO2 96%   BMI 30.79 kg/m   Physical Exam  General Appearance:    Overweight male, alert, cooperative, in no acute distress  Eyes:    PERRL, conjunctiva/corneas clear, EOM's intact       Lungs:     Clear to auscultation bilaterally, respirations unlabored  Heart:    Normal heart rate. Normal rhythm. No murmurs, rubs, or gallops.    Abdomen:   bowel  sounds present and normal in all 4 quadrants, nondistended.Tender lower abdomen bilaterally.  No CVA tenderness          Results for orders placed or performed in visit on 08/08/23  POCT urinalysis dipstick  Result Value Ref Range   Color, UA Light Yellow    Clarity, UA Clear    Glucose, UA Negative Negative   Bilirubin, UA Negative    Ketones, UA Negative    Spec Grav, UA 1.010 1.010 - 1.025   Blood, UA Negative    pH, UA 7.0 5.0 - 8.0   Protein, UA Negative Negative   Urobilinogen, UA 0.2 0.2 or 1.0 E.U./dL   Nitrite, UA Negative    Leukocytes, UA Negative Negative   Appearance     Odor      Assessment & Plan        Abdominal Pain, likely viral enteritis bilateral lower abdominal pain with a history of kidney stones. Pain is intermittent and associated with changes in position. No associated nausea or vomiting. -Order urinalysis to rule out urinary tract infection or kidney stones.  Diarrhea Increased frequency of bowel movements with loose stools since Saturday. No associated fever or changes in medication. -Consider stool studies if diarrhea persists, symptoms change or worsen    No follow-ups on file.      Mila Merry, MD  [facility] [phone] (phone) [phone] (fax)  [facility]

## 2023-08-08 NOTE — Telephone Encounter (Signed)
 Red Word that prompted transfer to Nurse Triage: lower stomach pain     Chief Complaint: Abdominal pain at belt line, constant. Has diarrhea as well. Symptoms: Above Frequency: Saturday Pertinent Negatives: Patient denies fever Disposition: [] ED /[] Urgent Care (no appt availability in office) / [x] Appointment(In office/virtual)/ []  [facility]/ [] Home Care/ [] Refused Recommended Disposition /[]  Mobile Bus/ []  Follow-up with PCP Additional Notes: Agrees with appointment.  Reason for Disposition  [1] MILD-MODERATE pain AND [2] constant AND [3] present > 2 hours  Answer Assessment - Initial Assessment Questions 1. LOCATION: "Where does it hurt?"      Lower 2. RADIATION: "Does the pain shoot anywhere else?" (e.g., chest, back)     At belt line 3. ONSET: "When did the pain begin?" (Minutes, hours or days ago)      Saturday 4. SUDDEN: "Gradual or sudden onset?"     Gradual 5. PATTERN "Does the pain come and go, or is it constant?"    - If it comes and goes: "How long does it last?" "Do you have pain now?"     (Note: Comes and goes means the pain is intermittent. It goes away completely between bouts.)    - If constant: "Is it getting better, staying the same, or getting worse?"      (Note: Constant means the pain never goes away completely; most serious pain is constant and gets worse.)      Constant 6. SEVERITY: "How bad is the pain?"  (e.g., Scale 1-10; mild, moderate, or severe)    - MILD (1-3): Doesn't interfere with normal activities, abdomen soft and not tender to touch.     - MODERATE (4-7): Interferes with normal activities or awakens from sleep, abdomen tender to touch.     - SEVERE (8-10): Excruciating pain, doubled over, unable to do any normal activities.       3, 8 7. RECURRENT SYMPTOM: "Have you ever had this type of stomach pain before?" If Yes, ask: "When was the last time?" and "What happened that time?"      No 8. CAUSE: "What do you think is  causing the stomach pain?"     Unsure 9. RELIEVING/AGGRAVATING FACTORS: "What makes it better or worse?" (e.g., antacids, bending or twisting motion, bowel movement)     No 10. OTHER SYMPTOMS: "Do you have any other symptoms?" (e.g., back pain, diarrhea, fever, urination pain, vomiting)       Diarrhea  Protocols used: Abdominal Pain - Male-A-AH

## 2023-08-22 ENCOUNTER — Ambulatory Visit: Admitting: Family Medicine

## 2023-08-23 ENCOUNTER — Other Ambulatory Visit: Payer: Self-pay | Admitting: Family Medicine

## 2023-08-23 DIAGNOSIS — I10 Essential (primary) hypertension: Secondary | ICD-10-CM

## 2023-08-25 NOTE — Telephone Encounter (Signed)
 Requested medication (s) are due for refill today: yes  Requested medication (s) are on the active medication list: yes  Last refill:  06/04/23 #90  Future visit scheduled: no  Notes to clinic:  called pt and LM on VM to call and make appt - unable to give courtesy refill due to overdue lab work - please review   Requested Prescriptions  Pending Prescriptions Disp Refills   lisinopril (ZESTRIL) 5 MG tablet [Pharmacy Med Name: LISINOPRIL 5 MG TAB] 90 tablet 0    Sig: TAKE 1 TABLET BY MOUTH DAILY     Cardiovascular:  ACE Inhibitors Failed - 08/25/2023 11:08 AM      Failed - Cr in normal range and within 180 days    Creatinine, Ser  Date Value Ref Range Status  08/29/2022 1.59 (H) 0.61 - 1.24 mg/dL Final         Failed - K in normal range and within 180 days    Potassium  Date Value Ref Range Status  08/29/2022 3.8 3.5 - 5.1 mmol/L Final         Failed - Valid encounter within last 6 months    Recent Outpatient Visits           2 weeks ago Lower abdominal pain   [facility] Malva Limes, MD              Passed - Patient is not pregnant      Passed - Last BP in normal range    BP Readings from Last 1 Encounters:  08/08/23 130/83

## 2023-08-26 ENCOUNTER — Telehealth: Payer: Self-pay

## 2023-08-26 NOTE — Telephone Encounter (Signed)
 Copied from CRM [id number]. Topic: General - Call Back - No Documentation >> Aug 25, 2023 11:12 AM Almira Coaster wrote: Reason for CRM: Patient is returning a call he received from Huntley Dec, no documentation on file to relay to patient. Patient's best call back number is [phone].

## 2023-08-29 ENCOUNTER — Telehealth: Payer: Self-pay | Admitting: Family Medicine

## 2023-08-29 DIAGNOSIS — Z1211 Encounter for screening for malignant neoplasm of colon: Secondary | ICD-10-CM

## 2023-08-29 DIAGNOSIS — Z860101 Personal history of adenomatous and serrated colon polyps: Secondary | ICD-10-CM

## 2023-08-29 NOTE — Telephone Encounter (Signed)
 Copied from CRM [id number]. Topic: General - Call Back - No Documentation >> Aug 25, 2023 11:12 AM Dustin Paul wrote: Reason for CRM: Patient is returning a call he received from Huntley Dec, no documentation on file to relay to patient. Patient's best call back number is [phone]. >> Aug 29, 2023 10:11 AM Dustin Paul wrote: Patient states he received a vm from Maralyn Sago, message was about a prescription refill says patient has to come in office before refill is completed, patient returning call concerning this prescription refill not aware of any other refills needed at this time

## 2023-08-29 NOTE — Telephone Encounter (Signed)
error 

## 2023-08-29 NOTE — Telephone Encounter (Signed)
 Patient asking for a referral for a colonoscopy to be scheduled. Patient call back [phone]

## 2023-08-29 NOTE — Telephone Encounter (Signed)
 Dr. Sherrie Mustache please review.  Patient was just seen for abdominal pain by you. He got his bp meds refilled.He is also due for Colonoscopy. Do you need to see him again in order to place referral.

## 2023-08-29 NOTE — Addendum Note (Signed)
 Addended by: Malva Limes on: 08/29/2023 04:53 PM   Modules accepted: Orders

## 2023-08-31 ENCOUNTER — Telehealth: Payer: Self-pay

## 2023-08-31 NOTE — Telephone Encounter (Signed)
 Pt requesting call back to schedule colonoscopy.

## 2023-09-07 ENCOUNTER — Other Ambulatory Visit: Payer: Self-pay | Admitting: Family Medicine

## 2023-09-07 DIAGNOSIS — K219 Gastro-esophageal reflux disease without esophagitis: Secondary | ICD-10-CM

## 2023-09-07 NOTE — Telephone Encounter (Signed)
 Requested medication (s) are due for refill today: yes  Requested medication (s) are on the active medication list: yes  Last refill:  mirtazipine 04/11/23 #30/2, rabeprazole 08/19/22 #90/4  Future visit scheduled: no  Notes to clinic:   pt due for CPE, last 07/2022. Please advise      Requested Prescriptions  Pending Prescriptions Disp Refills   mirtazapine (REMERON) 15 MG tablet [Pharmacy Med Name: MIRTAZAPINE 15 MG TAB] 30 tablet 2    Sig: TAKE ONE TABLET (15 MG) BY MOUTH AT BEDTIME     Psychiatry: Antidepressants - mirtazapine Failed - 09/07/2023  2:28 PM      Failed - Valid encounter within last 6 months    Recent Outpatient Visits           1 month ago Lower abdominal pain   [place] Malva Limes, MD               RABEprazole (ACIPHEX) 20 MG tablet [Pharmacy Med Name: RABEPRAZOLE SODIUM 20 MG DR TAB] 90 tablet 4    Sig: TAKE 1 TABLET BY MOUTH DAILY     Gastroenterology: Proton Pump Inhibitors Failed - 09/07/2023  2:28 PM      Failed - Valid encounter within last 12 months    Recent Outpatient Visits           1 month ago Lower abdominal pain   [facility] Malva Limes, MD

## 2023-10-07 ENCOUNTER — Encounter (HOSPITAL_COMMUNITY): Payer: Self-pay

## 2023-10-14 ENCOUNTER — Encounter: Payer: Self-pay | Admitting: Family Medicine

## 2023-10-14 ENCOUNTER — Ambulatory Visit: Admitting: Family Medicine

## 2023-10-14 VITALS — BP 140/81 | HR 75 | Temp 99.2°F | Resp 16 | Ht 72.0 in | Wt 222.7 lb

## 2023-10-14 DIAGNOSIS — Z0000 Encounter for general adult medical examination without abnormal findings: Secondary | ICD-10-CM

## 2023-10-14 DIAGNOSIS — Z23 Encounter for immunization: Secondary | ICD-10-CM

## 2023-10-14 DIAGNOSIS — Z9889 Other specified postprocedural states: Secondary | ICD-10-CM

## 2023-10-14 DIAGNOSIS — I491 Atrial premature depolarization: Secondary | ICD-10-CM

## 2023-10-14 DIAGNOSIS — I10 Essential (primary) hypertension: Secondary | ICD-10-CM

## 2023-10-14 DIAGNOSIS — Z8719 Personal history of other diseases of the digestive system: Secondary | ICD-10-CM

## 2023-10-14 DIAGNOSIS — Z860101 Personal history of adenomatous and serrated colon polyps: Secondary | ICD-10-CM

## 2023-10-14 DIAGNOSIS — Z0001 Encounter for general adult medical examination with abnormal findings: Secondary | ICD-10-CM

## 2023-10-14 DIAGNOSIS — Z125 Encounter for screening for malignant neoplasm of prostate: Secondary | ICD-10-CM

## 2023-10-14 DIAGNOSIS — F39 Unspecified mood [affective] disorder: Secondary | ICD-10-CM

## 2023-10-14 DIAGNOSIS — R1319 Other dysphagia: Secondary | ICD-10-CM | POA: Insufficient documentation

## 2023-10-14 NOTE — Progress Notes (Signed)
 Complete physical exam   Patient: Dustin Paul   DOB: December 26, 1960   63 y.o. Male  MRN: [id number] Visit Date: 10/14/2023  Today's healthcare provider: Jeralene Mom, MD   Chief Complaint  Patient presents with   Annual Exam    Patient reports of doing good. Exercising and sleeping well.   Subjective    Discussed the use of AI scribe software for clinical note transcription with the patient, who gave verbal consent to proceed.  History of Present Illness   Dustin Paul "[name]" is a 63 year old male who presents for an annual physical exam.  He experiences seasonal weight fluctuations, typically gaining 5 to 10 pounds in the winter and losing it in the summer. This winter, his weight increased to 227 pounds from his usual 213 pounds. He has since lost 5 pounds and aims to lose an additional 5 to 10 pounds. He exercises by walking 2 to 4 miles every other day and is considering adding more activities on non-walking days.  He monitors his blood pressure regularly, with recent readings around 132/82 mmHg, and systolic values rarely below 125 mmHg.  He has been trying to schedule a colonoscopy since his last one in 2016. He faced delays and was advised to wait another year or two. He is interested in scheduling with Dr. Corky Diener, as his wife's family has used him before.  He has a history of esophageal dilation performed 2 to 3 years ago due to a chronic cough that began in 2018. The procedure provided relief for about six months, but he now experiences choking on food despite thorough chewing.  He has a chronic knee issue, requiring weekly fluid drainage and injections, and is delaying knee replacement surgery, which has been needed for about six years. He has had three arthroscopic surgeries performed by Dr. Mozell Arias.  He has not received the pneumonia or shingles vaccines yet. No problems or side effects from his current medications. No trouble with hearing, ringing in the ears,  breathing, or feeling short-winded, though he often feels the urge to cough. No swelling in his hands, feet, or ankles, but notes issues with his knees.     Lab Results  Component Value Date   NA 137 08/29/2022   K 3.8 08/29/2022   CREATININE 1.59 (H) 08/29/2022   GFRNONAA 49 (L) 08/29/2022   GLUCOSE 153 (H) 08/29/2022   Last lipids Lab Results  Component Value Date   CHOL 136 07/27/2022   HDL 33 (L) 07/27/2022   LDLCALC 77 07/27/2022   TRIG 150 (H) 07/27/2022   CHOLHDL 3.9 06/05/2021     Past Medical History:  Diagnosis Date   Aneurysm of artery (HCC) 2011   LEFT (19 MM) AND RIGHT (11 MM) middle cerebral   Chronic kidney disease    H/O STONES   Complication of anesthesia    Headache    MIGRAINES   Insomnia    Lumbar stress fracture    PONV (postoperative nausea and vomiting)    AFTER KNEE SURGERY IN 1980'S   Seasonal allergies    Seizures (HCC)    last seizure in 2015   Visual hallucinations    Past Surgical History:  Procedure Laterality Date   BRAIN SURGERY  05/31/2009   2 ANEURYSMS-TOTAL OF 9 CLIPS IN BRAIN FROM ANEURYSM   COLONOSCOPY WITH PROPOFOL  N/A 01/16/2015   Procedure: COLONOSCOPY WITH PROPOFOL ;  Surgeon: Stephens Eis, MD;  Location: [facility];  Service: Gastroenterology;  Laterality:  N/A;   ESOPHAGOGASTRODUODENOSCOPY (EGD) WITH PROPOFOL  N/A 12/05/2018   Procedure: ESOPHAGOGASTRODUODENOSCOPY (EGD) WITH PROPOFOL ;  Surgeon: Toledo, Alphonsus Jeans, MD;  Location: [facility];  Service: Gastroenterology;  Laterality: N/A;   KNEE ARTHROSCOPY WITH MEDIAL MENISECTOMY Left 11/20/2015   Procedure: KNEE ARTHROSCOPY WITH partial MEDIAL MENISECTOMY / WITH REMOVAL OF LOOSE BODY;  Surgeon: Molli Angelucci, MD;  Location: [facility] ORS;  Service: Orthopedics;  Laterality: Left;   KNEE SURGERY Left    Social History   Socioeconomic History   Marital status: Married    Spouse name: Not on file   Number of children: 2   Years of education: Not on file   Highest education  level: Bachelor's degree (e.g., BA, AB, BS)  Occupational History   Occupation: engineer/retired  Tobacco Use   Smoking status: Never   Smokeless tobacco: Never  Vaping Use   Vaping status: Never Used  Substance and Sexual Activity   Alcohol use: No    Alcohol/week: 0.0 standard drinks of alcohol    Comment: 1-2 drinks a month   Drug use: No   Sexual activity: Not Currently  Other Topics Concern   Not on file  Social History Narrative   Not on file   Social Drivers of Health   Financial Resource Strain: Low Risk  (10/14/2023)   Overall Financial Resource Strain (CARDIA)    Difficulty of Paying Living Expenses: Not hard at all  Food Insecurity: No Food Insecurity (10/14/2023)   Hunger Vital Sign    Worried About Running Out of Food in the Last Year: Never true    Ran Out of Food in the Last Year: Never true  Transportation Needs: No Transportation Needs (10/14/2023)   PRAPARE - Administrator, Civil Service (Medical): No    Lack of Transportation (Non-Medical): No  Physical Activity: Sufficiently Active (10/14/2023)   Exercise Vital Sign    Days of Exercise per Week: 3 days    Minutes of Exercise per Session: 60 min  Stress: No Stress Concern Present (10/14/2023)   [employer] of Occupational Health - Occupational Stress Questionnaire    Feeling of Stress : Not at all  Social Connections: Not on file  Intimate Partner Violence: Not At Risk (10/14/2023)   Humiliation, Afraid, Rape, and Kick questionnaire    Fear of Current or Ex-Partner: No    Emotionally Abused: No    Physically Abused: No    Sexually Abused: No   Family Status  Relation Name Status   Mother  Alive   Father  Alive       does not have contact with    Sister  Deceased at age 64       MVA   Mat Aunt  (Not Specified)  No partnership data on file   Family History  Problem Relation Age of Onset   Depression Maternal Aunt    Allergies  Allergen Reactions   Levetiracetam Other (See  Comments)    Racing thoughts per [facility] neurology note 03-11-11 Other reaction(s): Other (See Comments) Racing thoughts   Aspirin     Nose bleed, thin blood    Patient Care Team: Lamon Pillow, MD as PCP - General (Family Medicine) Bridgette Campus as Consulting Physician (Optometry) Lorenzo Romberg, Delle Ferdinand, MD as Referring Physician (Psychiatry) Deon Flatter, DO as Referring Physician (Neurology) Geraline Knapp, MD (Urology) Todd Fossa, MD as Consulting Physician (Psychiatry)   Medications: Outpatient Medications Prior to Visit  Medication Sig   cyclobenzaprine (FLEXERIL) 10  MG tablet Take 1 tablet by mouth 3 (three) times daily.   famotidine -calcium carbonate-magnesium hydroxide (PEPCID  COMPLETE) 10-800-165 MG chewable tablet Chew 1 tablet by mouth daily as needed.   lamoTRIgine  (LAMICTAL ) 150 MG tablet Take 150 mg by mouth 2 (two) times daily.   lisinopril  (ZESTRIL ) 5 MG tablet TAKE 1 TABLET BY MOUTH DAILY   Melatonin 10 MG CAPS Take 1 capsule by mouth at bedtime.   mirtazapine  (REMERON ) 15 MG tablet TAKE ONE TABLET (15 MG) BY MOUTH AT BEDTIME   QUDEXY XR 150 MG CS24 take 1 capsule by mouth daily at bedtime   RABEprazole  (ACIPHEX ) 20 MG tablet TAKE 1 TABLET BY MOUTH DAILY   tadalafil  (CIALIS ) 20 MG tablet TAKE 1 TABLET 1 HOUR PRIOR TO INTERCOURSE.   topiramate (TOPAMAX) 100 MG tablet Take 100 mg by mouth daily.   topiramate ER (QUDEXY XR) 100 MG CS24 sprinkle capsule Take 100 mg by mouth at bedtime.   traMADol (ULTRAM) 50 MG tablet Take 1 tablet by mouth every 6 (six) hours as needed.   traZODone  (DESYREL ) 100 MG tablet TAKE TWO AND A HALF TABLETS (250 MG) BY MOUTH AT BEDTIME AS NEEDED FOR SLEEP   fluticasone  (FLONASE ) 50 MCG/ACT nasal spray Place 2 sprays into both nostrils daily. (Patient not taking: Reported on 10/14/2023)   oxyCODONE -acetaminophen  (PERCOCET/ROXICET) 5-325 MG tablet Take 1 tablet by mouth every 6 (six) hours as needed for severe pain. (Patient not taking:  Reported on 10/14/2023)   solifenacin  (VESICARE ) 10 MG tablet Take 1 tablet (10 mg total) by mouth daily.   No facility-administered medications prior to visit.    Review of Systems  Constitutional:  Negative for chills, diaphoresis and fever.  HENT:  Negative for congestion, ear discharge, ear pain, hearing loss, nosebleeds, sore throat and tinnitus.   Eyes:  Negative for photophobia, pain, discharge and redness.  Respiratory:  Negative for cough, shortness of breath, wheezing and stridor.   Cardiovascular:  Negative for chest pain, palpitations and leg swelling.  Gastrointestinal:  Negative for abdominal pain, blood in stool, constipation, diarrhea, nausea and vomiting.  Endocrine: Negative for polydipsia.  Genitourinary:  Negative for dysuria, flank pain, frequency, hematuria and urgency.  Musculoskeletal:  Negative for back pain, myalgias and neck pain.  Skin:  Negative for rash.  Allergic/Immunologic: Negative for environmental allergies.  Neurological:  Negative for dizziness, tremors, seizures, weakness and headaches.  Hematological:  Does not bruise/bleed easily.  Psychiatric/Behavioral:  Negative for hallucinations and suicidal ideas. The patient is not nervous/anxious.       Objective    BP (!) 140/81 (BP Location: Left Arm, Patient Position: Sitting, Cuff Size: Large)   Pulse 75   Temp 99.2 F (37.3 C) (Oral)   Resp 16   Ht 6' (1.829 m)   Wt 222 lb 11.2 oz (101 kg)   SpO2 97%   BMI 30.20 kg/m    Physical Exam   General Appearance:    Obese male. Alert, cooperative, in no acute distress, appears stated age  Head:    Normocephalic, without obvious abnormality, atraumatic  Eyes:    PERRL, conjunctiva/corneas clear, EOM's intact, fundi    benign, both eyes       Ears:    Normal TM's and external ear canals, both ears  Nose:   Nares normal, septum midline, mucosa normal, no drainage   or sinus tenderness  Throat:   Lips, mucosa, and tongue normal; teeth and gums  normal  Neck:   Supple, symmetrical, trachea midline,  no adenopathy;       thyroid:  No enlargement/tenderness/nodules; no carotid   bruit or JVD  Back:     Symmetric, no curvature, ROM normal, no CVA tenderness  Lungs:     Clear to auscultation bilaterally, respirations unlabored  Chest wall:    No tenderness or deformity  Heart:    Normal heart rate. Frequent early beats. No murmurs, rubs, or gallops.  S1 and S2 normal  Abdomen:     Soft, non-tender, bowel sounds active all four quadrants,    no masses, no organomegaly  Genitalia:    deferred  Rectal:    deferred  Extremities:   All extremities are intact. No cyanosis or edema  Pulses:   2+ and symmetric all extremities  Skin:   Skin color, texture, turgor normal, no rashes or lesions  Lymph nodes:   Cervical, supraclavicular, and axillary nodes normal  Neurologic:   CNII-XII intact. Normal strength, sensation and reflexes      throughout     Last depression screening scores    10/14/2023    2:58 PM 08/08/2023    3:22 PM 08/08/2023    3:21 PM  PHQ 2/9 Scores  PHQ - 2 Score 0 0 0  PHQ- 9 Score  0    Last fall risk screening    10/14/2023    2:59 PM  Fall Risk   Falls in the past year? 0  Number falls in past yr: 0  Injury with Fall? 0  Risk for fall due to : No Fall Risks   Last Audit-C alcohol use screening    10/14/2023    2:57 PM  Alcohol Use Disorder Test (AUDIT)  1. How often do you have a drink containing alcohol? 0  2. How many drinks containing alcohol do you have on a typical day when you are drinking? 0  3. How often do you have six or more drinks on one occasion? 0  AUDIT-C Score 0   A score of 3 or more in women, and 4 or more in men indicates increased risk for alcohol abuse, EXCEPT if all of the points are from question 1   No results found for any visits on 10/14/23.  Assessment & Plan    Routine Health Maintenance and Physical Exam  Exercise Activities and Dietary recommendations  Goals   None      Immunization History  Administered Date(s) Administered   Influenza Inj Mdck Quad Pf 03/14/2019   Influenza,inj,Quad PF,6+ Mos 02/15/2020   Moderna Sars-Covid-2 Vaccination 07/16/2019, 08/13/2019   PNEUMOCOCCAL CONJUGATE-20 10/14/2023   Td 10/12/1996   Tdap 07/12/2007, 02/13/2019    Health Maintenance  Topic Date Due   HIV Screening  Never done   Zoster Vaccines- Shingrix (1 of 2) Never done   Colonoscopy  01/16/2020   COVID-19 Vaccine (3 - 2024-25 season) 01/30/2023   INFLUENZA VACCINE  12/30/2023   DTaP/Tdap/Td (4 - Td or Tdap) 02/12/2029   Hepatitis C Screening  Completed   Pneumococcal Vaccine 13-69 Years old  Aged Out   HPV VACCINES  Aged Out   Meningococcal B Vaccine  Aged Out    Discussed health benefits of physical activity, and encouraged him to engage in regular exercise appropriate for his age and condition.  Counseled on recommendations for Shingles vaccine which he declined today.    2. Prostate cancer screening  - PSA Total (Reflex To Free)  3. Need for pneumococcal vaccination  - Pneumococcal conjugate vaccine 20-valent (Prevnar 20)  4. Premature atrial complexes Incidental finding, asymptomatic.   - TSH - Magnesium  5. Primary hypertension Fairly well controlled.   - EKG 12-Lead - CBC - Comprehensive metabolic panel with GFR - Lipid panel  6. Episodic mood disorder (HCC) Doing well current medications.   7. History of adenomatous polyp of colon Over due for follow up colonoscopy.  - Ambulatory referral to Gastroenterology  8. Status post dilation of esophageal narrowing Previously followed by Dr. Corky Diener, now with symptoms again.  - Ambulatory referral to Gastroenterology  9. Esophageal dysphagia  - Ambulatory referral to Gastroenterology        Jeralene Mom, MD  [facility] [phone] (phone) [phone] (fax)  [facility]

## 2023-10-19 ENCOUNTER — Ambulatory Visit: Payer: Self-pay | Admitting: Family Medicine

## 2023-10-19 LAB — CBC
Hematocrit: 45.7 % (ref 37.5–51.0)
Hemoglobin: 15.1 g/dL (ref 13.0–17.7)
MCH: 30.3 pg (ref 26.6–33.0)
MCHC: 33 g/dL (ref 31.5–35.7)
MCV: 92 fL (ref 79–97)
Platelets: 173 10*3/uL (ref 150–450)
RBC: 4.99 x10E6/uL (ref 4.14–5.80)
RDW: 14.3 % (ref 11.6–15.4)
WBC: 4.8 10*3/uL (ref 3.4–10.8)

## 2023-10-19 LAB — LIPID PANEL
Chol/HDL Ratio: 3.8 ratio (ref 0.0–5.0)
Cholesterol, Total: 118 mg/dL (ref 100–199)
HDL: 31 mg/dL — ABNORMAL LOW (ref >39)
LDL Chol Calc (NIH): 65 mg/dL (ref 0–99)
Triglycerides: 122 mg/dL (ref 0–149)
VLDL Cholesterol Cal: 22 mg/dL (ref 5–40)

## 2023-10-19 LAB — COMPREHENSIVE METABOLIC PANEL WITH GFR
ALT: 17 IU/L (ref 0–44)
AST: 22 IU/L (ref 0–40)
Albumin: 4.6 g/dL (ref 3.9–4.9)
Alkaline Phosphatase: 51 IU/L (ref 44–121)
BUN/Creatinine Ratio: 10 (ref 10–24)
BUN: 13 mg/dL (ref 8–27)
Bilirubin Total: 0.4 mg/dL (ref 0.0–1.2)
CO2: 20 mmol/L (ref 20–29)
Calcium: 8.9 mg/dL (ref 8.6–10.2)
Chloride: 103 mmol/L (ref 96–106)
Creatinine, Ser: 1.26 mg/dL (ref 0.76–1.27)
Globulin, Total: 2.1 g/dL (ref 1.5–4.5)
Glucose: 107 mg/dL — ABNORMAL HIGH (ref 70–99)
Potassium: 4.7 mmol/L (ref 3.5–5.2)
Sodium: 138 mmol/L (ref 134–144)
Total Protein: 6.7 g/dL (ref 6.0–8.5)
eGFR: 64 mL/min/{1.73_m2} (ref >59)

## 2023-10-19 LAB — PSA TOTAL (REFLEX TO FREE): Prostate Specific Ag, Serum: 2.8 ng/mL (ref 0.0–4.0)

## 2023-10-19 LAB — MAGNESIUM: Magnesium: 2.2 mg/dL (ref 1.6–2.3)

## 2023-10-19 LAB — TSH: TSH: 1.62 u[IU]/mL (ref 0.450–4.500)

## 2023-11-04 ENCOUNTER — Other Ambulatory Visit: Payer: Self-pay | Admitting: Family Medicine

## 2023-11-04 DIAGNOSIS — F39 Unspecified mood [affective] disorder: Secondary | ICD-10-CM

## 2023-11-04 NOTE — Telephone Encounter (Signed)
 Requested Prescriptions  Refused Prescriptions Disp Refills   mirtazapine  (REMERON ) 15 MG tablet [Pharmacy Med Name: MIRTAZAPINE  15 MG TAB] 30 tablet 2    Sig: TAKE ONE TABLET (15 MG) BY MOUTH AT BEDTIME     Psychiatry: Antidepressants - mirtazapine  Failed - 11/04/2023  5:31 PM      Failed - Valid encounter within last 6 months    Recent Outpatient Visits           3 weeks ago Annual physical exam   [facility] Lamon Pillow, MD   2 months ago Lower abdominal pain   [facility] Lamon Pillow, MD

## 2023-11-07 MED ORDER — MIRTAZAPINE 15 MG PO TABS: 15.0000 mg | ORAL_TABLET | Freq: Every day | ORAL | 10 refills | Status: AC

## 2023-11-07 NOTE — Addendum Note (Signed)
 Addended by: Lamon Pillow on: 11/07/2023 07:40 AM   Modules accepted: Orders

## 2023-11-26 ENCOUNTER — Other Ambulatory Visit: Payer: Self-pay | Admitting: Family Medicine

## 2023-11-26 DIAGNOSIS — I10 Essential (primary) hypertension: Secondary | ICD-10-CM

## 2023-12-19 ENCOUNTER — Other Ambulatory Visit: Payer: Self-pay | Admitting: Urology

## 2024-03-09 ENCOUNTER — Ambulatory Visit: Payer: Self-pay

## 2024-03-09 DIAGNOSIS — K641 Second degree hemorrhoids: Secondary | ICD-10-CM | POA: Diagnosis not present

## 2024-03-09 DIAGNOSIS — D122 Benign neoplasm of ascending colon: Secondary | ICD-10-CM | POA: Diagnosis not present

## 2024-03-09 DIAGNOSIS — K222 Esophageal obstruction: Secondary | ICD-10-CM | POA: Diagnosis not present

## 2024-03-09 DIAGNOSIS — K317 Polyp of stomach and duodenum: Secondary | ICD-10-CM | POA: Diagnosis not present

## 2024-03-09 DIAGNOSIS — K5730 Diverticulosis of large intestine without perforation or abscess without bleeding: Secondary | ICD-10-CM | POA: Diagnosis not present

## 2024-03-09 DIAGNOSIS — K449 Diaphragmatic hernia without obstruction or gangrene: Secondary | ICD-10-CM | POA: Diagnosis not present

## 2024-03-09 DIAGNOSIS — K2100 Gastro-esophageal reflux disease with esophagitis, without bleeding: Secondary | ICD-10-CM | POA: Diagnosis not present

## 2024-03-09 DIAGNOSIS — Z860101 Personal history of adenomatous and serrated colon polyps: Secondary | ICD-10-CM | POA: Diagnosis not present

## 2024-03-09 DIAGNOSIS — Z1211 Encounter for screening for malignant neoplasm of colon: Secondary | ICD-10-CM | POA: Diagnosis present

## 2024-05-23 ENCOUNTER — Other Ambulatory Visit: Payer: Self-pay | Admitting: Family Medicine

## 2024-05-23 DIAGNOSIS — F5105 Insomnia due to other mental disorder: Secondary | ICD-10-CM
